# Patient Record
Sex: Female | Born: 1975 | Race: White | Hispanic: No | Marital: Married | Smoking: Former smoker
Health system: Southern US, Community
[De-identification: ages and names within clinical notes are randomized; demographics above are authoritative.]

## PROBLEM LIST (undated history)

## (undated) DIAGNOSIS — R519 Headache, unspecified: Secondary | ICD-10-CM

## (undated) DIAGNOSIS — Z8719 Personal history of other diseases of the digestive system: Secondary | ICD-10-CM

## (undated) DIAGNOSIS — K219 Gastro-esophageal reflux disease without esophagitis: Secondary | ICD-10-CM

## (undated) DIAGNOSIS — M92529 Juvenile osteochondrosis of tibia tubercle, unspecified leg: Secondary | ICD-10-CM

## (undated) DIAGNOSIS — K589 Irritable bowel syndrome without diarrhea: Secondary | ICD-10-CM

## (undated) DIAGNOSIS — Z8489 Family history of other specified conditions: Secondary | ICD-10-CM

## (undated) DIAGNOSIS — Z87442 Personal history of urinary calculi: Secondary | ICD-10-CM

## (undated) DIAGNOSIS — R569 Unspecified convulsions: Secondary | ICD-10-CM

## (undated) DIAGNOSIS — N3010 Interstitial cystitis (chronic) without hematuria: Secondary | ICD-10-CM

## (undated) DIAGNOSIS — R51 Headache: Secondary | ICD-10-CM

## (undated) HISTORY — DX: Gastro-esophageal reflux disease without esophagitis: K21.9

## (undated) HISTORY — PX: LAPAROSCOPY: SHX197

## (undated) HISTORY — DX: Unspecified convulsions: R56.9

## (undated) HISTORY — PX: OTHER SURGICAL HISTORY: SHX169

## (undated) HISTORY — PX: TUBAL LIGATION: SHX77

## (undated) HISTORY — DX: Irritable bowel syndrome, unspecified: K58.9

## (undated) HISTORY — PX: UPPER GASTROINTESTINAL ENDOSCOPY: SHX188

## (undated) HISTORY — PX: APPENDECTOMY: SHX54

## (undated) HISTORY — PX: COLONOSCOPY: SHX174

## (undated) HISTORY — DX: Headache: R51

## (undated) HISTORY — DX: Headache, unspecified: R51.9

## (undated) HISTORY — PX: KNEE SURGERY: SHX244

## (undated) HISTORY — PX: CHOLECYSTECTOMY: SHX55

---

## 2004-04-21 ENCOUNTER — Other Ambulatory Visit: Admission: RE | Admit: 2004-04-21 | Discharge: 2004-04-21 | Payer: Self-pay | Admitting: Obstetrics and Gynecology

## 2004-06-19 ENCOUNTER — Emergency Department (HOSPITAL_COMMUNITY): Admission: EM | Admit: 2004-06-19 | Discharge: 2004-06-20 | Payer: Self-pay | Admitting: *Deleted

## 2004-06-24 ENCOUNTER — Ambulatory Visit (HOSPITAL_COMMUNITY): Admission: RE | Admit: 2004-06-24 | Discharge: 2004-06-24 | Payer: Self-pay | Admitting: Urology

## 2004-10-14 ENCOUNTER — Other Ambulatory Visit: Admission: RE | Admit: 2004-10-14 | Discharge: 2004-10-14 | Payer: Self-pay | Admitting: Obstetrics and Gynecology

## 2004-10-28 ENCOUNTER — Inpatient Hospital Stay (HOSPITAL_COMMUNITY): Admission: AD | Admit: 2004-10-28 | Discharge: 2004-10-30 | Payer: Self-pay | Admitting: Obstetrics and Gynecology

## 2004-11-04 ENCOUNTER — Inpatient Hospital Stay (HOSPITAL_COMMUNITY): Admission: AD | Admit: 2004-11-04 | Discharge: 2004-11-06 | Payer: Self-pay | Admitting: Obstetrics and Gynecology

## 2005-01-22 ENCOUNTER — Emergency Department (HOSPITAL_COMMUNITY): Admission: EM | Admit: 2005-01-22 | Discharge: 2005-01-22 | Payer: Self-pay | Admitting: Emergency Medicine

## 2005-01-24 ENCOUNTER — Emergency Department (HOSPITAL_COMMUNITY): Admission: EM | Admit: 2005-01-24 | Discharge: 2005-01-24 | Payer: Self-pay | Admitting: Emergency Medicine

## 2005-03-13 ENCOUNTER — Inpatient Hospital Stay (HOSPITAL_COMMUNITY): Admission: AD | Admit: 2005-03-13 | Discharge: 2005-03-14 | Payer: Self-pay | Admitting: Obstetrics & Gynecology

## 2005-03-20 ENCOUNTER — Inpatient Hospital Stay (HOSPITAL_COMMUNITY): Admission: AD | Admit: 2005-03-20 | Discharge: 2005-03-29 | Payer: Self-pay | Admitting: Obstetrics and Gynecology

## 2005-04-08 ENCOUNTER — Encounter (INDEPENDENT_AMBULATORY_CARE_PROVIDER_SITE_OTHER): Payer: Self-pay | Admitting: Specialist

## 2005-04-08 ENCOUNTER — Inpatient Hospital Stay (HOSPITAL_COMMUNITY): Admission: AD | Admit: 2005-04-08 | Discharge: 2005-04-11 | Payer: Self-pay | Admitting: Obstetrics and Gynecology

## 2005-05-17 ENCOUNTER — Ambulatory Visit (HOSPITAL_COMMUNITY): Admission: RE | Admit: 2005-05-17 | Discharge: 2005-05-17 | Payer: Self-pay | Admitting: *Deleted

## 2005-05-17 ENCOUNTER — Encounter (INDEPENDENT_AMBULATORY_CARE_PROVIDER_SITE_OTHER): Payer: Self-pay | Admitting: *Deleted

## 2005-05-20 ENCOUNTER — Other Ambulatory Visit: Admission: RE | Admit: 2005-05-20 | Discharge: 2005-05-20 | Payer: Self-pay | Admitting: Obstetrics and Gynecology

## 2005-10-22 ENCOUNTER — Observation Stay (HOSPITAL_COMMUNITY): Admission: AD | Admit: 2005-10-22 | Discharge: 2005-10-24 | Payer: Self-pay | Admitting: Obstetrics and Gynecology

## 2006-01-13 ENCOUNTER — Ambulatory Visit (HOSPITAL_COMMUNITY): Admission: RE | Admit: 2006-01-13 | Discharge: 2006-01-13 | Payer: Self-pay | Admitting: Gastroenterology

## 2006-02-28 HISTORY — PX: DILATION AND CURETTAGE OF UTERUS: SHX78

## 2006-08-08 ENCOUNTER — Ambulatory Visit (HOSPITAL_BASED_OUTPATIENT_CLINIC_OR_DEPARTMENT_OTHER): Admission: RE | Admit: 2006-08-08 | Discharge: 2006-08-08 | Payer: Self-pay | Admitting: Urology

## 2007-01-03 ENCOUNTER — Encounter (INDEPENDENT_AMBULATORY_CARE_PROVIDER_SITE_OTHER): Payer: Self-pay | Admitting: Obstetrics and Gynecology

## 2007-01-03 ENCOUNTER — Ambulatory Visit (HOSPITAL_COMMUNITY): Admission: AD | Admit: 2007-01-03 | Discharge: 2007-01-03 | Payer: Self-pay | Admitting: Obstetrics and Gynecology

## 2007-05-25 ENCOUNTER — Inpatient Hospital Stay (HOSPITAL_COMMUNITY): Admission: AD | Admit: 2007-05-25 | Discharge: 2007-05-25 | Payer: Self-pay | Admitting: Obstetrics and Gynecology

## 2007-05-28 ENCOUNTER — Inpatient Hospital Stay (HOSPITAL_COMMUNITY): Admission: AD | Admit: 2007-05-28 | Discharge: 2007-05-31 | Payer: Self-pay | Admitting: Obstetrics and Gynecology

## 2007-06-04 ENCOUNTER — Observation Stay (HOSPITAL_COMMUNITY): Admission: AD | Admit: 2007-06-04 | Discharge: 2007-06-04 | Payer: Self-pay | Admitting: Obstetrics and Gynecology

## 2007-06-15 ENCOUNTER — Inpatient Hospital Stay (HOSPITAL_COMMUNITY): Admission: AD | Admit: 2007-06-15 | Discharge: 2007-06-15 | Payer: Self-pay | Admitting: Obstetrics and Gynecology

## 2007-08-01 ENCOUNTER — Inpatient Hospital Stay (HOSPITAL_COMMUNITY): Admission: AD | Admit: 2007-08-01 | Discharge: 2007-08-01 | Payer: Self-pay | Admitting: Obstetrics and Gynecology

## 2007-08-30 ENCOUNTER — Inpatient Hospital Stay (HOSPITAL_COMMUNITY): Admission: AD | Admit: 2007-08-30 | Discharge: 2007-08-31 | Payer: Self-pay | Admitting: Obstetrics and Gynecology

## 2007-09-26 ENCOUNTER — Inpatient Hospital Stay (HOSPITAL_COMMUNITY): Admission: AD | Admit: 2007-09-26 | Discharge: 2007-09-26 | Payer: Self-pay | Admitting: Obstetrics and Gynecology

## 2007-10-30 ENCOUNTER — Inpatient Hospital Stay (HOSPITAL_COMMUNITY): Admission: AD | Admit: 2007-10-30 | Discharge: 2007-10-31 | Payer: Self-pay | Admitting: Obstetrics and Gynecology

## 2007-12-06 ENCOUNTER — Inpatient Hospital Stay (HOSPITAL_COMMUNITY): Admission: AD | Admit: 2007-12-06 | Discharge: 2007-12-06 | Payer: Self-pay | Admitting: Obstetrics and Gynecology

## 2007-12-23 ENCOUNTER — Inpatient Hospital Stay (HOSPITAL_COMMUNITY): Admission: AD | Admit: 2007-12-23 | Discharge: 2007-12-23 | Payer: Self-pay | Admitting: Obstetrics and Gynecology

## 2007-12-31 ENCOUNTER — Encounter (INDEPENDENT_AMBULATORY_CARE_PROVIDER_SITE_OTHER): Payer: Self-pay | Admitting: Obstetrics and Gynecology

## 2007-12-31 ENCOUNTER — Inpatient Hospital Stay (HOSPITAL_COMMUNITY): Admission: RE | Admit: 2007-12-31 | Discharge: 2008-01-03 | Payer: Self-pay | Admitting: Obstetrics and Gynecology

## 2010-05-21 ENCOUNTER — Other Ambulatory Visit (HOSPITAL_COMMUNITY): Payer: Self-pay | Admitting: Obstetrics and Gynecology

## 2010-05-21 ENCOUNTER — Ambulatory Visit (HOSPITAL_COMMUNITY)
Admission: RE | Admit: 2010-05-21 | Discharge: 2010-05-21 | Disposition: A | Discharge status: Home / Self Care | Payer: BC Managed Care – PPO | Source: Ambulatory Visit | Attending: Obstetrics and Gynecology | Admitting: Obstetrics and Gynecology

## 2010-05-21 DIAGNOSIS — R1031 Right lower quadrant pain: Secondary | ICD-10-CM | POA: Insufficient documentation

## 2010-05-21 MED ORDER — IOHEXOL 300 MG/ML  SOLN: 100.0000 mL | Freq: Once | INTRAMUSCULAR | Status: AC | PRN

## 2010-05-21 MED ORDER — IOHEXOL 300 MG/ML  SOLN
100.0000 mL | Freq: Once | INTRAMUSCULAR | Status: AC | PRN
  Administered 2010-05-21: 100 mL via INTRAVENOUS

## 2010-06-22 ENCOUNTER — Ambulatory Visit (HOSPITAL_BASED_OUTPATIENT_CLINIC_OR_DEPARTMENT_OTHER)
Admission: RE | Admit: 2010-06-22 | Discharge: 2010-06-22 | Disposition: A | Discharge status: Home / Self Care | Payer: BC Managed Care – PPO | Source: Ambulatory Visit | Attending: Urology | Admitting: Urology

## 2010-06-22 DIAGNOSIS — N3010 Interstitial cystitis (chronic) without hematuria: Secondary | ICD-10-CM | POA: Insufficient documentation

## 2010-06-22 DIAGNOSIS — R3989 Other symptoms and signs involving the genitourinary system: Secondary | ICD-10-CM | POA: Insufficient documentation

## 2010-06-22 DIAGNOSIS — Z01812 Encounter for preprocedural laboratory examination: Secondary | ICD-10-CM | POA: Insufficient documentation

## 2010-06-22 LAB — POCT I-STAT 4, (NA,K, GLUC, HGB,HCT)
Glucose, Bld: 96 mg/dL (ref 70–99)
HCT: 41 % (ref 36.0–46.0)
Hemoglobin: 13.9 g/dL (ref 12.0–15.0)
Potassium: 3.7 mEq/L (ref 3.5–5.1)
Sodium: 143 mEq/L (ref 135–145)

## 2010-06-22 LAB — POCT PREGNANCY, URINE: Preg Test, Ur: NEGATIVE

## 2010-07-12 NOTE — Op Note (Signed)
  [name], [name]             ACCOUNT NO.:  [id number]  MEDICAL RECORD NO.:  [id number]           PATIENT TYPE:  LOCATION:                                 FACILITY:  PHYSICIAN:  Martina Sinner, MD DATE OF BIRTH:  [birth year]  DATE OF PROCEDURE:  06/22/2010 DATE OF DISCHARGE:                              OPERATIVE REPORT   PREOPERATIVE DIAGNOSIS:  Interstitial cystitis.  POSTOPERATIVE DIAGNOSIS:  Interstitial cystitis.  SURGERY:  Cystoscopy, bladder hydrodistention, bladder instillation therapy.  PROCEDURE IN DETAIL:  [name] consented to the above procedure hopefully to help her pain.  She was given ciprofloxacin prior the procedure.  She was prepped and draped in usual fashion.  A 21 scope was utilized.  Bladder mucosa and trigone were normal.  She was hydrodistended to approximately for 4 to 5 minutes to approximately 600 mL.  Her bladder was emptied.  I reinspected her bladder and she had mild glomerulations along the floor of the bladder with no bladder injury.  Bladder was emptied.  As a separate procedure, I inserted a red rubber catheter, instilled 15 cc of 0.5% Marcaine plus 400 mg of Pyridium.  [name] will continue to be treated for interstitial cystitis.          ______________________________ Martina Sinner, MD     SAM/MEDQ  D:  06/22/2010  T:  06/22/2010  Job:  [id number]  Electronically Signed by Alfredo Martinez MD on 07/12/2010 01:16:09 PM

## 2010-07-13 NOTE — Op Note (Signed)
NAMEALMAS, RAKE             ACCOUNT NO.:  [id number]   MEDICAL RECORD NO.:  [id number]          PATIENT TYPE:  MAT   LOCATION:  MATC                          FACILITY:  [facility]   PHYSICIAN:  Michelle L. Grewal, M.D.DATE OF BIRTH:  [birth year]   DATE OF PROCEDURE:  01/03/2007  DATE OF DISCHARGE:  01/03/2007                               OPERATIVE REPORT   PREOPERATIVE DIAGNOSIS:  Incomplete abortion.   POSTOPERATIVE DIAGNOSIS:  Incomplete abortion.   PROCEDURES PERFORMED:  Dilatation and evacuation.   SURGEON:  Michelle L. Vincente Poli, M.D.   ANESTHESIA:  MAC with local.   FINDINGS:  Products of conception.   PROCEDURE:  Patient was taken to the operating room after informed  consent was obtained.  She was then prepped and draped in the usual  sterile fashion.  In-and-out catheter was used to empty the bladder.  The speculum was inserted into the vagina; the cervix was visualized.  There were noted to be some products of conception, a small amount,  extruding from the cervical os.  The cervix was grasped with a  tenaculum.  A paracervical block was performed.  I then dilated the  cervix a little bit more with Shawnie Pons dilators.  I inserted a #7 suction  cannula and performed a suction curettage with retrieval of contents  consistent with products of conception.  This curette was removed and  the sharp curette was inserted and the uterus was thoroughly curetted of  all tissue.  Final suction curettage was performed.  The tissue appeared  to be consistent with products of conception.  All tissue was sent to  Pathology for analysis.  There was no bleeding noted at the end of the  procedure.  The patient tolerated the procedure very well.  All sponge,  lap and instrument counts were correct x2.      Michelle L. Vincente Poli, M.D.  Electronically Signed     MLG/MEDQ  D:  01/03/2007  T:  01/04/2007  Job:  [id number]

## 2010-07-13 NOTE — Op Note (Signed)
Traci [name], Traci [name]             ACCOUNT NO.:  [id number]   MEDICAL RECORD NO.:  [id number]          PATIENT TYPE:  INP   LOCATION:  9130                          FACILITY:  [facility]   PHYSICIAN:  Michelle L. Grewal, M.D.DATE OF BIRTH:  Nov 24, 1975   DATE OF PROCEDURE:  12/31/2007  DATE OF DISCHARGE:                               OPERATIVE REPORT   PREOPERATIVE DIAGNOSES:  1. Intrauterine pregnancy at 39 plus weeks.  2. Previous cesarean section.  3. Desires permanent sterilization.   POSTOPERATIVE DIAGNOSES:  1. Intrauterine pregnancy at 39 plus weeks.  2. Previous cesarean section.  3. Desires permanent sterilization.   PROCEDURES:  Repeat low transverse cesarean section and bilateral tubal  ligation.   SURGEON:  Michelle L. Vincente Poli, MD.   ANESTHESIA:  Spinal.   FINDINGS:  Female infant, Apgars 8 at 1 minute and 9 at 5 minutes.   PATHOLOGY:  Fallopian tube segments.   ESTIMATED BLOOD LOSS:  500 mL.   DRAINS:  Foley.   PROCEDURE:  The patient was taken to the operating room.  Her spinal was  placed by the anesthesiologist without incident.  She was then prepped  and draped in usual sterile fashion.  Foley catheter was inserted and  draining clear urine.  A low transverse incision was made after the  patient was draped and carried down to the fascia.  The fascia was  scored in the midline and extended laterally.  The rectus muscles  separated in the midline.  The peritoneum was entered bluntly.  The  peritoneal incision was then stretched.  The lower uterine segment was  developed, and the bladder flap was created sharply and then digitally.  The bladder blade was then readjusted.  A low transverse incision was  made in the uterus.  The uterus was entered using a hemostat and  amniotic fluid was clear.  The baby was in cephalic presentation and was  delivered easily with 1 pull of the vacuum without a pop-off.  The baby  was a female infant, Apgars were 8 at 1 minute and 9  at 5 minutes.  The  cord was clamped and cut.  The baby was handed to the awaiting neonatal  team and taken to the nursery.  The placenta was manually removed, noted  be normal, and intact with a 3-vessel cord.  The uterus was exteriorized  and cleared of all clots and debris.  The uterine incision was closed in  1 layer using 0 chromic in a running locked stitch.  There was some  evidence of some small endometriotic implants along the right round  ligament.  They were then cauterized.  At this point, I performed a  modified Pomeroy bilateral tubal ligation because the patient desired  permanent sterilization.  The midportion of each tube was grasped using  Babcock clamp and a plain gut suture x2 was used to tie off the 3-cm  knuckle of tissue on each side.  The fallopian tube segment was then  excised on each side using Metzenbaum scissors and then the bases were  cauterized using cautery.  Each tubal segment was sent  separately to  Pathology.  The uterus was returned to the abdomen.  Irrigation was  performed.  Hemostasis was noted at all surgical pedicles.  The  peritoneum rectus muscles were reapproximated using 0 Vicryl.  The  fascia was closed using 0 Vicryl in a running stitch, starting at 1  corner and going to the other end.  After irrigation of subcutaneous  layer, the skin was closed with staples.  All sponge, lap and,  instrument counts were correct x2.  The patient went to recovery room in  stable condition.      Michelle L. Vincente Poli, M.D.  Electronically Signed     MLG/MEDQ  D:  12/31/2007  T:  01/01/2008  Job:  [id number]

## 2010-07-13 NOTE — Op Note (Signed)
[name], Traci [name]             ACCOUNT NO.:  [id number]   MEDICAL RECORD NO.:  [id number]          PATIENT TYPE:  AMB   LOCATION:  NESC                         FACILITY:  [facility]   PHYSICIAN:  Martina Sinner, MD DATE OF BIRTH:  December 18, 1975   DATE OF PROCEDURE:  08/08/2006  DATE OF DISCHARGE:                               OPERATIVE REPORT   PREOPERATIVE DIAGNOSIS:  Pelvic pain.   POSTOPERATIVE DIAGNOSES:  Pelvic pain, interstitial cystitis.   SURGERY:  Cystoscopy, hydrodistention of bladder, installation therapy.   SURGEON:  Martina Sinner, M.D.   INDICATIONS FOR PROCEDURE:  [name] has pelvic pain, in  keeping with the diagnosis of interstitial cystitis.  Today she was here  for hydrodistention.   DESCRIPTION OF PROCEDURE:  She was given preoperative antibiotics.  The  patient was prepped and draped in the usual fashion.  I used the 66-  [place] scope for the examination.  The bladder mucosa and trigone were  normal.  There was foreign body or carcinoma.  I hydrochlorothiazide-  distended her to 900 mL.  I then re-examined the bladder after I emptied  it and she had diffuse glomerulations throughout the bladder.  There are  more glomerulations at 5 and 7 o'clock.  The bladder was emptied.  A red  rubber catheter was inserted and I instilled 25 mL of 0.5% Marcaine with  400 mg of Pyridium.   The procedure was well-tolerated and the patient was taken to the  recovery room.   DISPOSITION:  The patient will be treated for interstitial cystitis with  my usual protocol.           ______________________________  Martina Sinner, MD  Electronically Signed     SAM/MEDQ  D:  08/08/2006  T:  08/08/2006  Job:  [id number]

## 2010-07-16 NOTE — Discharge Summary (Signed)
NAMEMarland Kitchen  Traci [name], Traci [name]             ACCOUNT NO.:  [id number]   MEDICAL RECORD NO.:  [id number]          PATIENT TYPE:  INP   LOCATION:  9320                          FACILITY:  [facility]   PHYSICIAN:  Freddy Finner, M.D.   DATE OF BIRTH:  Jan 20, 1976   DATE OF ADMISSION:  03/20/2005  DATE OF DISCHARGE:  03/29/2005                                 DISCHARGE SUMMARY   ADMISSION DIAGNOSES:  1.  Intrauterine pregnancy at 33-3/7 weeks estimated gestational age.  2.  Nausea vomiting.   DISCHARGE DIAGNOSIS:  Intrauterine pregnancy at 34-6/7 weeks and  chololithiasis.   REASON FOR ADMISSION:  Please see written H&P.   HOSPITAL COURSE:  The patient is a 35 year old primigravida white married  female that was admitted to [facility] at 33-3/7 weeks  estimated gestational age.  The patient was complaining of nausea and  vomiting for approximately 3 days.  She denied any fever or chills or  diarrhea.  Pregnancy had been complicated by a seizure disorder with the  patient currently on Topamax with last seizure approximately 3 years ago.  The patient also had been noted to have first trimester hyperemesis and was  currently being treated for GERD. On admission vital signs were stable.  She  was afebrile.  Abdomen soft and nontender.  Fetal heart tones were reactive.  Uterus was noted to have some irritability. Laboratory findings revealed  hemoglobin of 10.9, platelet count of 295,000, WBC count of 7.7.  Urinalysis  revealed specific gravity of 1.015 and nitrates were negative.  The patient  was admitted for IV fluid administration and IV Pepcid and Zofran as needed.  On the following morning the patient continued to feel weak.  She was  experiencing some dry heaves. She had not experienced any diarrhea. Vital  signs remained stable.  She continued to be afebrile. Uterus was nontender.  On hospital day #3 the patient continued to experience some nausea,  occasional vomiting.  The  patient had experienced some diarrhea after  breakfast. She also complained of some pain in the right upper quadrant and  reported some low uterine contractions.  Vital signs remained stable.  She  was afebrile. Abdomen soft. She denied any leakage of amniotic fluid or  vaginal bleeding. No CVA tenderness was noted.  Urine culture was pending.  Fetal heart tones were in the 150's. The patient was placed on the monitor.  Contractions were not noted.  Fetal heart tones continued to be reactive.  Lipase and amylase were drawn and liver function tests. Gallbladder  ultrasound was performed which revealed multiple gallstones, however no  thickening of the wall of the gallbladder and negative Murphy's sign.  Cholecystic fluid minimal.  Minimal sludge was also noted within the  gallbladder. Common bile duct was also within normal limits. Surgical  consult was obtained and the patient was placed on a low fat diet. No  evidence of acute cholecystitis was noted and decision was made to observe  the patient at this current time and treat symptomatically and planned for  postpartum lap cholecystectomy.  The patient continued to have some  nausea,  vomiting and some diarrhea throughout the course of her stay. Decision was  made to proceed with amniocentesis for possible lung maturity. LS ratio  returned with ratio of 1.1:1 without PG.  Fetal heart tones continued to be  reactive.  Mild occasional uterine contraction was noted.  Betamethasone was  administered on January 30th and the patient was now at 34-6/7 weeks. She  continued to have some nausea.  However, no vomitus. Fetus was active. Vital  signs were stable.  The patient remained afebrile. Completion of  betamethasone administration was given and the patient was discharged home.   CONDITION ON DISCHARGE:  Stable.  Low fat diet.   ACTIVITY:  Up as tolerated.  The patient is to call for increasing uterine  contractions, increase in vaginal  discharge, or vaginal bleeding.  The  patient is also to call for spontaneous rupture of membranes with decreased  fetal movement.   DISCHARGE MEDICATIONS:  1.  Vicodin #30 one p.o. every 4 to 6 hours p.r.n.  2.  Prenatal vitamins one p.o. daily.  3.  Ambien 10 mg one p.o. q.h.s. p.r.n.  4.  Phenergan 25 mg one p.o. every 8 hours p.r.n. nausea.  5.  Lomotil as needed.      Julio Sicks, N.P.      Freddy Finner, M.D.  Electronically Signed    CC/MEDQ  D:  05/29/2005  T:  05/30/2005  Job:  [id number]

## 2010-07-16 NOTE — Consult Note (Signed)
NAME:  Traci [name], Traci [name]             ACCOUNT NO.:  [id number]   MEDICAL RECORD NO.:  [id number]          PATIENT TYPE:  INP   LOCATION:  9320                          FACILITY:  [facility]   PHYSICIAN:  Alfonse Ras, MD   DATE OF BIRTH:  07-17-1975   DATE OF CONSULTATION:  03/23/2005  DATE OF DISCHARGE:                                   CONSULTATION   REFERRING PHYSICIAN:  Dr. Konrad Dolores   HISTORY OF PRESENT ILLNESS:  Patient is a 35 year old G1, P0 34-week  pregnant female with a history of nausea and vomiting and right upper  quadrant abdominal pain.  Work-up here in the hospital reveals  cholelithiasis with multiple mobile gallstones, no gallbladder wall  thickening, no common bile duct dilatation, no pericholecystic fluid, no  Murphy's sign.  Liver function tests are normal as is her lipase and white  count is 7000.  Patient was able to keep down lunch today, but had some  nausea and vomiting with breakfast this morning.   PAST MEDICAL HISTORY:  1.  Gastroesophageal reflux disease.  2.  Migraines.   MEDICATIONS:  Topamax and Protonix.   PHYSICAL EXAMINATION:  GENERAL:  She is an age-appropriate gravid female in  no distress.  HEENT:  Benign.  Sclerae non-icteric.  LUNGS:  Clear to auscultation, percussion x2.  HEART:  Regular rate and rhythm without murmurs, rubs, or gallops.  ABDOMEN:  Gravid uterus and some tenderness in the epigastrium and right  upper quadrant.  No peritoneal signs are noted.   IMPRESSION:  Symptomatic cholelithiasis.   PLAN:  Postpartum laparoscopic cholecystectomy; however, if the patient's  condition worsens or she begins to show signs of acute cholecystitis we may  be able to temporize her with a percutaneous cholecystostomy but  laparoscopic cholecystectomy is not indicated at this time.      Alfonse Ras, MD  Electronically Signed     KRE/MEDQ  D:  03/23/2005  T:  03/23/2005  Job:  [id number]

## 2010-07-16 NOTE — Op Note (Signed)
NAME:  Traci [name], Traci [name]             ACCOUNT NO.:  [id number]   MEDICAL RECORD NO.:  [id number]          PATIENT TYPE:  AMB   LOCATION:  DAY                          FACILITY:  [facility]   PHYSICIAN:  Alfonse Ras, MD   DATE OF BIRTH:  29-Mar-1975   DATE OF PROCEDURE:  05/17/2005  DATE OF DISCHARGE:                                 OPERATIVE REPORT   PREOPERATIVE DIAGNOSIS:  Symptomatic cholelithiasis.   POSTOPERATIVE DIAGNOSIS:  Symptomatic cholelithiasis.   PROCEDURES:  Laparoscopic cholecystectomy.   SURGEON:  Alfonse Ras, MD.   ASSISTANTAngelia Mould. Derrell Lolling, MD.   ANESTHESIA:  General.   DESCRIPTION OF PROCEDURE:  The patient was taken to the operating room,  placed in the supine position and after adequate anesthesia was induced  using endotracheal tube, the abdomen was prepped and draped in the normal  sterile fashion. Using a transverse infraumbilical incision, I dissected  down to the fascia. The fascia was opened vertically. An #0 Vicryl  pursestring suture was placed around the fascial defect. A Hassan trocar was  placed in the abdomen and pneumoperitoneum was obtained. Under direct  vision, a 10 mm trocar was placed in the subxiphoid region and two 5 mm  trocars placed in the right abdomen. The gallbladder was identified and  retracted cephalad. Starting at the fundus, a good critical view was  visualized at the cystic duct, its junction with the common duct was  identified and it was triply clipped and divided. The cystic artery was  identified, dissected in a similar fashion, triply clipped and divided. The  gallbladder was taken off the gallbladder bed using Bovie electrocautery and  a small rent was made in the posterior wall. It was placed in an EndoCatch  bag and removed through the umbilical port without difficulty. Adequate  hemostasis was ensured and the right upper quadrant was copiously irrigated  with normal saline. The gallbladder bed was inspected.  There was no  bleeding. Pneumoperitoneum was released. Trocars were removed,  infraumbilical fascial defect was closed with the #0 Vicryl pursestring  suture. Skin incisions were closed with subcuticular 4-0 Monocryl. Steri-  Strips and sterile dressings were applied. The patient tolerated the  procedure well and went to PACU in good condition.      Alfonse Ras, MD  Electronically Signed     KRE/MEDQ  D:  05/17/2005  T:  05/18/2005  Job:  [id number]

## 2010-07-16 NOTE — Op Note (Signed)
Traci [name], Traci [name]             ACCOUNT NO.:  [id number]   MEDICAL RECORD NO.:  [id number]          PATIENT TYPE:  INP   LOCATION:  9371                          [facility]ACILITY:  [facility]   PHYSICIAN:  Michelle L. Grewal, M.D.DATE OF BIRTH:  [birth year]   DATE OF PROCEDURE:  04/08/2005  DATE OF DISCHARGE:                                 OPERATIVE REPORT   PREOPERATIVE DIAGNOSES:  1.  Intrauterine pregnancy at 36-1/7 weeks.  2.  Multiple gallstones.  3.  Biliary colic.  4.  Seizure disorder, generalized tonic/clonic while in labor and delivery.   POSTOPERATIVE DIAGNOSES:  1.  Intrauterine pregnancy at 36-1/7 weeks.  2.  Multiple gallstones.  3.  Biliary colic.  4.  Seizure disorder, generalized tonic/clonic while in labor and delivery.   PROCEDURE:  Primary low transverse cesarean section.   SURGEON:  Michelle L. Vincente Poli, M.D.   ANESTHESIA:  Spinal.   ESTIMATED BLOOD LOSS:  500 mL.   FINDINGS:  Female infant, Apgars 8 at one minute 8 at five minutes with a cord  pH of 7.26.  Loose nuchal cord x1.   PROCEDURE:  The patient was taken to the operating room where spinal was  placed by Dr. Leilani Able without incident.  She was prepped and  draped in the usual sterile fashion.  Of note, prior to the spinal the  patient was alert to place and name but not to day of week.  She was then  prepped and draped, and a Foley catheter was inserted and draining clear  urine.  A low transverse incision was made, carried down to the fascia.  The  fascia was scored in the midline and extended laterally.  The rectus muscles  were separated in the midline and extended laterally.  The peritoneum was  entered bluntly, and the peritoneal incision was stretched.  A bladder blade  was inserted.  The lower uterine segment was identified and the bladder flap  was created sharply and then digitally.  The bladder blade was readjusted.  A low transverse incision was made in the uterus.  The uterus was  entered  using a hemostat.  The baby was delivered quite easily, and he had a loose  nuchal cord x1 with Apgars of 8 at one minute and 8 at minutes and cord pH  at 7.26.  The cord was clamped and cut.  The baby was handed to the waiting  pediatrician.  Unasyn was given as well as Pitocin.  The uterus was cleared  of all clots and debris and uterine incision was closed using 0 chromic in a  continuous running locked stitch.  Irrigation was performed.  Hemostasis was  excellent.  The peritoneum was reapproximated using O Vicryl, and the rectus  muscles were reapproximated as  well.  The fascia was closed using O Vicryl in a continuous running stitch.  After irrigation of the subcutaneous layers, the  skin was closed with  staples.  All sponge, lap and instrument counts were correct x2.  The  patient tolerated the procedure well.      Michelle L. Vincente Poli, M.D.  Electronically  Signed     MLG/MEDQ  D:  04/08/2005  T:  04/09/2005  Job:  [id number]

## 2010-07-16 NOTE — Discharge Summary (Signed)
NAMEETOSHA, WETHERELL             ACCOUNT NO.:  [id number]   MEDICAL RECORD NO.:  [id number]          PATIENT TYPE:  INP   LOCATION:  9123                          FACILITY:  [facility]   PHYSICIAN:  Zelphia Cairo, MD    DATE OF BIRTH:  Mar 01, 1975   DATE OF ADMISSION:  04/08/2005  DATE OF DISCHARGE:  04/11/2005                                 DISCHARGE SUMMARY   ADMISSION DIAGNOSES:  1.  Intrauterine pregnancy at 25 weeks' estimated gestational age.  2.  Induction of labor secondary to biliary colic.   DISCHARGE DIAGNOSES:  S.  Status post low transverse cesarean section  secondary to seizure, generalized tonic-clonic.  1.  A viable female infant.   PROCEDURE:  Primary low transverse cesarean section.   REASON FOR ADMISSION:  Please see written H&P.   HOSPITAL COURSE:  The patient is a 35 year old primigravida who was admitted  to [facility] at 37 weeks' estimated gestational age for  induction of labor secondary to biliary colic.  The patient had been  previously admitted for biliary colic and was noted to have multiple  gallstones as noted by ultrasound.  The patient had been seen by a general  surgeon, Dr. Luan Pulling, and had been recommended to have a laparoscopic  cholecystectomy postpartally.  The patient had continued to have continuous  pain in the right upper quadrant with some nausea and diarrhea.  The patient  had noted to have lost two pounds since her previous hospitalization.  The  patient was also status post betamethasone, which had been administered a  week and half prior to admission.  The patient also had a history of  seizures, currently on Topamax.   On admission, vital signs were stable.  She was afebrile.  Fetal heart tones  were within normal limits.  Abdomen was noted to have positive Murphy's  sign.  Cervix was noted to be closed, 80% effaced, vertex at a -2 station.  The patient was admitted, where Cytotec was administered.  Later that  evening, Dr. Vincente Poli was called urgently to labor and delivery because the  patient was noted to have a two-minute generalized tonic-clonic seizure.  Anesthesiologist and Dr. Shawnie Pons had also been called urgently, and the  patient was administered oxygen and had been placed on her side.  Immediately after the seizure, the patient was alert to place and name but  not to the date.  Fetal heart tones had dropped during and after the seizure  but, however, had gone back to baseline.  A decision was made to proceed  with an urgent cesarean delivery.  The patient was now stable postictally;  however, she was remote full from a vaginal delivery.  The patient was then  taken to the operating room, where spinal anesthesia was administered  without difficulty.  A low transverse incision was made with delivery of a  viable female infant weighing 4 pounds 12 ounces, with Apgars of 8 at one  minute and 9 at five minutes.  Arterial cord pH was 7.26.  Dr. Orlin Hilding,  neurologist on call, was also notified and the patient was  given Dilantin  1  g load IV.  The patient did tolerated the procedure well and was taken to  the recovery room in stable condition.  The patient was now evaluated by Dr.  Orlin Hilding, neurologist.  On postoperative day #1 the patient was feeling well,  no further seizures, vital signs were stable.  She was afebrile with urine  output that was noted to be good.  Abdomen soft, nontender with exception of  positive Murphy sign.  Uterus was nontender.  Abdominal dressing was noted  to be clean, dry and intact.  Laboratory findings revealed hemoglobin of  10.8 with platelet count of 274,000 , WBC count of 13.4.  Chemistries were  within normal limits wit the exception of potassium that was noted to be low  at 2.9.  Liver function tests were within normal limits.  Potassium was  added to IV fluids.  On postoperative day #2 the patient was without  complaint.  Vital signs remained stable.  She was  afebrile.  Fundus firm and  nontender.  Incision was clean, dry and intact.  The patient did not have  any further seizures and was now started on Topamax 125 mg b.i.d.  Postoperative day #3 the patient was without complaint.  Vital signs were  stable.  She was afebrile.  Blood pressure 111/79.  Abdomen soft.  Fundus  firm and nontender.  Incision was clean, dry and intact.  She was ambulating  well, discharge instructions were given and the patient was discharged home.   CONDITION ON DISCHARGE:  Stable.   DIET:  Regular as tolerated.   ACTIVITY:  No heavy lifting, no driving x2 weeks, no vaginal entry.   FOLLOW-UP:  Patient to follow up in the office in one week for an incision  check.  She is to call for temperature greater than 100 degrees, persistent  nausea or vomiting or heavy vaginal bleeding and/or redness or drainage from  the incisional site.   DISCHARGE MEDICATIONS:  1.  Topamax 125 mg one p.o. b.i.d.  2.  Percocet 5/325 mg #30, one p.o. q.4-6h. p.r.n.  3.  Motrin 600 mg every six hours.  4.  Ferrous sulfate 325 mg one p.o. b.i.d.  5.  Colace one p.o. q.d. as needed.      Julio Sicks, N.P.      Zelphia Cairo, MD  Electronically Signed    CC/MEDQ  D:  05/29/2005  T:  05/30/2005  Job:  [id number]

## 2010-07-16 NOTE — Discharge Summary (Signed)
Traci [name], Traci [name]             ACCOUNT NO.:  [id number]   MEDICAL RECORD NO.:  [id number]          PATIENT TYPE:  INP   LOCATION:  9130                          FACILITY:  [facility]   PHYSICIAN:  Dineen Kid. Rana Snare, M.D.    DATE OF BIRTH:  Sep 07, 1975   DATE OF ADMISSION:  12/31/2007  DATE OF DISCHARGE:  01/03/2008                               DISCHARGE SUMMARY   ADMITTING DIAGNOSES:  1. Intrauterine pregnancy at term.  2. Previous cesarean section, desires repeat.  3. Multiparity, desires permanent sterilization.   DISCHARGE DIAGNOSES:  1. Status post low transverse cesarean section.  2. Viable female infant.  3. Permanent sterilization.   PROCEDURES:  1. Repeat low transverse cesarean section.  2. Bilateral tubal ligation.   REASON FOR ADMISSION:  Please see written H and P.   HOSPITAL COURSE:  The patient was a 36 year old gravida 3, para 1 who  presented to [facility] for scheduled cesarean section and  bilateral tubal ligation.  The patient had started uterine contractions  on the morning of admission she was scheduled for C-section  approximately at lunchtime.  The patient did have a known seizure  disorder and was currently on medication.  The patient was admitted to  the Labor and Delivery and was subsequently taken to the operating room  where spinal anesthesia was administered without difficulty.  A low  transverse incision was made with delivery of a viable female infant  weighing 6 pounds 11 ounces with Apgars of 9 at one minute and 9 at five  minutes.  The patient tolerated the procedure well and was taken to the  recovery room in stable condition.  On postoperative day #1, the patient  was without complaint.  Vital signs were stable.  Abdomen soft.  Fundus  firm and nontender.  Abdominal dressing was noted to have a small amount  of drainage noted on the bandage.  Laboratory findings showed hemoglobin  of 9.6, platelet count of 181,000, WBC count of 7.8.  Blood  type was  noted to be A positive.  On postoperative day #2, the patient was  without complaint.  Vital signs were stable.  She was afebrile.  Fundus  firm and nontender.  Incision was clean, dry, and intact.  Staples were  intact.  The patient was ambulating well.  Later that afternoon, the  patient had ambulated to the bathroom, was noted that incision was  bleeding.  Pressure bandage was applied.  Later that evening, the  physician did visit with the patient and some saturation of the bandage  was noted.  No active bright red blood was noted.  The bandage was  removed.  No induration or active bleeding was noted.  Pressure bandage  was reapplied and decision was made to leave the staples in place for  the next 7-10 days.  On postoperative day #3, the patient was concerned  regarding the incision continued to have some serosanguineous drainage.  Vital signs were stable.  She was afebrile.  Abdomen soft.  Fundus firm,  nontender.  Some ecchymosis was noted superior and inferior to the  incisional  site.  The dressing seem to be mostly saturated with  serosanguineous drainage, which was removed and incision was noted to  have no leakage even with pressure applied to it.  Staples were left in  place, and the patient was later discharged home.   CONDITION ON DISCHARGE:  Stable.   DIET:  Regular as tolerated.   ACTIVITY:  No heavy lifting, no driving x2 weeks, no vaginal entry.   FOLLOW UP:  The patient to follow up in the office in 3-4 days for an  incision check.  She is to call for temperature greater than 100  degrees, persistent nausea, vomiting, heavy vaginal bleeding and/or  redness or further drainage oozing from the incisional site.   DISCHARGE MEDICATIONS:  1. Percocet 5/325 #30 one p.o. every 4-6 hours p.r.n.  2. Motrin 600 mg every 6 hours.  3. Keflex 500 mg one p.o. q.i.d. x1 week.  4. Prenatal vitamins one p.o. daily.  5. Colace one p.o. daily p.r.n.      Julio Sicks, N.P.      Dineen Kid Rana Snare, M.D.  Electronically Signed    CC/MEDQ  D:  02/05/2008  T:  02/05/2008  Job:  [id number]

## 2010-07-16 NOTE — Consult Note (Signed)
Traci [name], Traci [name]             ACCOUNT NO.:  [id number]   MEDICAL RECORD NO.:  [id number]          PATIENT TYPE:  INP   LOCATION:  9371                          FACILITY:  [facility]   PHYSICIAN:  Catherine A. Orlin Hilding, M.D.DATE OF BIRTH:  June 04, 1975   DATE OF CONSULTATION:  04/08/2005  DATE OF DISCHARGE:                                   CONSULTATION   NEUROLOGY CONSULT   CHIEF COMPLAINT:  Seizure.   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  She had previously been on Depakote and  Lamictal, and she is currently on Topamax for about 4 years with good  seizure control in general with no seizures for 3 years although there is a  history of her being somewhat fragile with having seizures when missing  doses.  She has a recent history of abdominal pain and was found to have  gallstones during this pregnancy.  She is admitted for induction of labor at  36 weeks to allow for a laparoscopic cholecystectomy to follow.  While in  labor, she had a 2 minute generalized seizure and she recovered  uneventfully, but was taken to the OR for a C-section with spinal anesthesia  because it was felt that vaginal delivery was not imminent and there was a  fear of further seizure.   REVIEW OF SYSTEMS:  She is somewhat drowsy now and otherwise is positive for  abdominal pain. In a 12-system review from the admission,  there is a  history of some reflux and diarrhea, and the right upper quadrant pain, and  some sleeping difficulties.  She has taken p.r.n. Ambien.  Generally, no  other complaints.   PAST MEDICAL HISTORY:  Significant for epilepsy followed by a Dr. Arville Go at [facility].  She is G1 and now P1, she has had knee surgery,  obesity and gallstones.   MEDICATIONS:  1.  Topamax 100 mg b.i.d.  2.  Protonix, I do not know the dose on that.  3.   Ambien p.r.n.   ALLERGIES:  NO KNOWN DRUG ALLERGIES.   SOCIAL HISTORY:  She is married, no other children and no cigarette or  alcohol use.   FAMILY HISTORY:  No history of seizures.   EXAMINATION:  VITAL SIGNS:  Temperature is 98.8, pulse 96, BP 125/65,  respirations 20 and 99% sat.  HEAD:  Normocephalic, atraumatic.  NECK:  Supple without bruits.  HEART:  Regular rate and rhythm.  MOUTH:  Without any tongue laceration.  NEUROLOGIC EXAMINATION:  Mental Status:  She is somewhat sedated, but  arouses easily with normal language, naming and repetition.  She is oriented  to date and place.  Cranial nerves:  Pupils are equal and reactive,  extraocular movements are intact, visual fields are full, facial sensation  is normal, facial motor activity is intact, hearing is intact and tongue is  midline.  Motor exam:  She has no drift in the upper extremities and has  normal strength.  The lower extremities  are paralyzed secondary to spinal  anesthesia.  Deep tendon reflexes are 2+ and symmetric in the upper  extremities, absent in the lower extremities.  Coordination:  Finger-to-nose  is intact.  She is unable to do heel-to-shin because of the spinal  anesthesia.  Sensory is intact in the upper extremities, again anesthetic in  the lower extremities due to the spinal anesthesia.   LABORATORY:  Unremarkable.   IMPRESSION:  Breakthrough seizure in labor in a patient with a history of  epilepsy presumably juvenile myoclonic epilepsy x17 years with somewhat  brittle control, nothing to point to eclampsia or other cause.   RECOMMENDATIONS:  Bolus with Dilantin 1 g x1 for this perioperative period  with no maintenance, we will allow to drift down spontaneously.  We will  increase her Topamax to 125 mg b.i.d.  She will need to follow up with her  primary neurologist, Dr. August Saucer and plan ahead for the cholecystectomy if she  is to be n.p.o. at all for that period as the Topamax is not available  IV.      Catherine A. Orlin Hilding, M.D.  Electronically Signed     CAW/MEDQ  D:  04/08/2005  T:  04/09/2005  Job:  [id number]

## 2010-07-16 NOTE — Op Note (Signed)
NAME:  Traci [name], Traci [name]             ACCOUNT NO.:  [id number]   MEDICAL RECORD NO.:  [id number]          PATIENT TYPE:  AMB   LOCATION:  ENDO                         FACILITY:  [facility]   PHYSICIAN:  Anselmo Rod, M.D.  DATE OF BIRTH:  January 14, 1976   DATE OF PROCEDURE:  01/13/2006  DATE OF DISCHARGE:                                 OPERATIVE REPORT   PROCEDURE:  Screening colonoscopy.   ENDOSCOPIST:  Anselmo Rod, M.D.   INSTRUMENT USED:  Olympus video colonoscope.   INDICATIONS FOR PROCEDURE:  A 35 year old white female with a history of  change in bowel habits, diarrhea with abdominal pain and occasional blood in  stool.  Rule out IBD.  The patient had a laparoscopic evaluation done that  showed some inflammatory changes around the cecum. Therefore, the patient's  gynecologist requested colonoscopy.   PREPROCEDURE PREPARATION:  Informed consent was procured from the patient.  The patient was fasted for four hours prior to the procedure and prepped  with 20 OsmoPrep pills the night of the procedure and 12 OsmoPrep pills the  morning of the procedure.  Risks and benefits of the procedure including a  10% misread of cancer and polyps were discussed with the patient as well.   PREPROCEDURE PHYSICAL:  VITAL SIGNS:  The patient had stable vital signs.  NECK:  Supple.  CHEST:  Clear to auscultation.  CARDIAC:  S1 and S2 regular.  ABDOMEN:  Soft with normal bowel sounds.   DESCRIPTION OF PROCEDURE:  The patient was placed in the left lateral  decubitus position and sedated with 75 mcg of fentanyl and 8 mg of Versed  given intravenously in slow incremental doses.  Once the patient was  adequately sedated, maintained on low-flow oxygen and continuous cardiac  monitoring, the Olympus video colonoscope was advanced from the rectum to  the cecum.  The appendiceal orifice and cecal valve were clearly visualized  and photographed.  The terminal ileum appeared healthy and without lesions.  No masses, polyps, erosions, ulcerations, or diverticulosis were noted.  Retroflexion in the rectum revealed no abnormalities.  The patient tolerated  the procedure well without complications.  The patient had some abdominal  discomfort with the insufflation of air into the colon indicating a  component of visceral hypersensitivity with irritable bowel syndrome.   IMPRESSION:  1. Normal colonoscopy to the terminal ileum.  No masses, polyps or      diverticula seen. No evidence of inflammatory bowel disease.  2.      Visceral hypersensitivity appreciated with insufflation of air into the      colon consistent with irritable bowel syndrome.   RECOMMENDATIONS:  1. Continue high fiber diet with liberal fluid intake.  2. Repeat colonoscopy at age 26 unless the patient any abnormal symptoms      prior to that, in which case she should contact the office immediately      for further recommendations.  3. Outpatient followup in the next two weeks for further recommendations.      Anselmo Rod, M.D.  Electronically Signed     JNM/MEDQ  D:  01/13/2006  T:  01/13/2006  Job:  [id number]   cc:   Marcelino Duster L. Vincente Poli, M.D.

## 2010-11-22 LAB — URINALYSIS, ROUTINE W REFLEX MICROSCOPIC
Bilirubin Urine: NEGATIVE
Glucose, UA: NEGATIVE
Hgb urine dipstick: NEGATIVE
Ketones, ur: NEGATIVE
Nitrite: NEGATIVE
Protein, ur: NEGATIVE
Specific Gravity, Urine: 1.015
Urobilinogen, UA: 0.2
pH: 8

## 2010-11-22 LAB — URINALYSIS, DIPSTICK ONLY
Bilirubin Urine: NEGATIVE
Glucose, UA: NEGATIVE
Hgb urine dipstick: NEGATIVE
Ketones, ur: NEGATIVE
Leukocytes, UA: NEGATIVE
Nitrite: NEGATIVE
Protein, ur: NEGATIVE
Specific Gravity, Urine: <1.005 — ABNORMAL LOW
Urobilinogen, UA: 0.2
pH: 5.5

## 2010-11-22 LAB — COMPREHENSIVE METABOLIC PANEL
ALT: 18
AST: 15
Albumin: 3.3 — ABNORMAL LOW
Alkaline Phosphatase: 44
BUN: 7
CO2: 22
Calcium: 8.9
Chloride: 113 — ABNORMAL HIGH
Creatinine, Ser: 0.68
GFR calc Af Amer: >60
GFR calc non Af Amer: >60
Glucose, Bld: 86
Potassium: 3.9
Sodium: 141
Total Bilirubin: 0.5
Total Protein: 6

## 2010-11-22 LAB — CBC
HCT: 37.3
Hemoglobin: 13.2
MCHC: 35.4
MCV: 90.1
Platelets: 240
RBC: 4.14
RDW: 12.2
WBC: 6.8

## 2010-11-22 LAB — TSH: TSH: 1.09

## 2010-11-23 LAB — URINALYSIS, ROUTINE W REFLEX MICROSCOPIC
Hgb urine dipstick: NEGATIVE
Nitrite: NEGATIVE
Protein, ur: NEGATIVE
Urobilinogen, UA: 0.2

## 2010-11-25 LAB — URINALYSIS, ROUTINE W REFLEX MICROSCOPIC
Glucose, UA: NEGATIVE
Hgb urine dipstick: NEGATIVE
Ketones, ur: >80 — AB
Protein, ur: NEGATIVE
Urobilinogen, UA: 0.2

## 2010-11-26 LAB — URINALYSIS, ROUTINE W REFLEX MICROSCOPIC
Bilirubin Urine: NEGATIVE
Glucose, UA: NEGATIVE
Hgb urine dipstick: NEGATIVE
Ketones, ur: NEGATIVE
pH: 8

## 2010-11-26 LAB — URINE CULTURE: Colony Count: NO GROWTH

## 2010-11-26 LAB — COMPREHENSIVE METABOLIC PANEL
ALT: 14
Alkaline Phosphatase: 52
CO2: 22
Chloride: 108
Glucose, Bld: 90
Potassium: 3.6
Sodium: 136
Total Protein: 5.8 — ABNORMAL LOW

## 2010-11-26 LAB — CBC
MCHC: 34.2
RBC: 3.64 — ABNORMAL LOW
WBC: 9.3

## 2010-11-29 LAB — CBC
HCT: 35.8 — ABNORMAL LOW
MCV: 92.7
Platelets: 241
RBC: 3.86 — ABNORMAL LOW

## 2010-11-29 LAB — RPR: RPR Ser Ql: NONREACTIVE

## 2010-11-30 LAB — CBC
Hemoglobin: 9.6 — ABNORMAL LOW
MCHC: 33.4
RBC: 3.08 — ABNORMAL LOW
WBC: 7.8

## 2010-12-01 LAB — URINALYSIS, ROUTINE W REFLEX MICROSCOPIC
Glucose, UA: NEGATIVE
Hgb urine dipstick: NEGATIVE
Specific Gravity, Urine: 1.025
pH: 6.5

## 2010-12-01 LAB — URINE MICROSCOPIC-ADD ON

## 2010-12-07 LAB — CBC
HCT: 38.7
Platelets: 280
RDW: 12.9
WBC: 7.3

## 2010-12-07 LAB — ABO/RH: ABO/RH(D): A POS

## 2010-12-16 LAB — POCT HEMOGLOBIN-HEMACUE: Operator id: 133231

## 2010-12-16 LAB — POCT PREGNANCY, URINE
Operator id: 268271
Preg Test, Ur: NEGATIVE

## 2012-02-29 DIAGNOSIS — J189 Pneumonia, unspecified organism: Secondary | ICD-10-CM

## 2012-02-29 HISTORY — DX: Pneumonia, unspecified organism: J18.9

## 2012-08-30 ENCOUNTER — Other Ambulatory Visit: Payer: Self-pay | Admitting: Obstetrics and Gynecology

## 2014-01-31 ENCOUNTER — Other Ambulatory Visit: Payer: Self-pay | Admitting: Obstetrics and Gynecology

## 2014-02-03 LAB — CYTOLOGY - PAP

## 2014-05-08 ENCOUNTER — Ambulatory Visit (INDEPENDENT_AMBULATORY_CARE_PROVIDER_SITE_OTHER): Payer: BLUE CROSS/BLUE SHIELD | Admitting: Neurology

## 2014-05-08 ENCOUNTER — Encounter: Payer: Self-pay | Admitting: Neurology

## 2014-05-08 VITALS — BP 152/93 | HR 84 | Temp 99.2°F | Resp 18 | Ht 62.0 in | Wt 230.0 lb

## 2014-05-08 DIAGNOSIS — G40409 Other generalized epilepsy and epileptic syndromes, not intractable, without status epilepticus: Secondary | ICD-10-CM

## 2014-05-08 DIAGNOSIS — F419 Anxiety disorder, unspecified: Secondary | ICD-10-CM

## 2014-05-08 DIAGNOSIS — G43009 Migraine without aura, not intractable, without status migrainosus: Secondary | ICD-10-CM | POA: Diagnosis not present

## 2014-05-08 DIAGNOSIS — R112 Nausea with vomiting, unspecified: Secondary | ICD-10-CM | POA: Diagnosis not present

## 2014-05-08 DIAGNOSIS — G4700 Insomnia, unspecified: Secondary | ICD-10-CM | POA: Diagnosis not present

## 2014-05-08 DIAGNOSIS — G40309 Generalized idiopathic epilepsy and epileptic syndromes, not intractable, without status epilepticus: Secondary | ICD-10-CM

## 2014-05-08 MED ORDER — PROMETHAZINE HCL 12.5 MG PO TABS: 12.5000 mg | ORAL_TABLET | Freq: Four times a day (QID) | ORAL | Status: DC | PRN

## 2014-05-08 NOTE — Progress Notes (Signed)
Subjective:    Patient ID: Traci [name] is a 39 y.o. female.  HPI     Huston Foley, MD, PhD [facility] [address], [address] [address] [place], [place] [address]  Dear Dr. Vincente Poli,   I saw your patient, Traci [name], upon your kind request in my neurologic clinic today for initial consultation of her seizure disorder. The patient is accompanied by her husband today. As you know, Traci [name] is a 39 year old right-handed woman with an underlying medical history of kidney stones , migraines, and recent concern for pelvic infection, treated with Rocephin, azithromycin and Flagyl, who has a history of seizures since age 39. She states she has grand mal seizures. She usually followed with Dr. August Saucer and had seen her for about 11 years. She requested a change in her neurologist. She's currently on Vimpat 200 mg twice daily. She feels she is having breakthrough seizures. She does not sleep well. For sleep she has been on Zanaflex as high as 6-12 12 mg. She takes Ativan 1 mg to 2 mg each night. For her migraines she has been on Zofran for her nausea which she feels does not help at all. She also has been on Stadol nasal spray and may take it every 60-90 minutes almost every day. She tries Advil over-the-counter first for her headache which is typically a throbbing headache with photophobia, nausea and vomiting and no auras typically. Her seizures are described as all over twitching. She can make abnormal sounds and smaller twitching in her sleep. Her husband says that she makes clicking sounds in her sleep and twitching movements of her hands and feet and sleep. She is usually not responsive. She has bitten her tongue and her sleep. She was stable on Topamax for about 10 years she says. Last year around May she started having more problems. She was previously treated with Depakote as a teenager and then Lamictal. She gained quite a bit of weight on Depakote. When she was 39 years  old she was treated with some medication but is not sure which one. She had a brain scan in the distant past. She is not sure whether it was completely normal but reports that there was no problem with any of her tests before. She had EEGs before. She had a 2 day ambulatory EEG fairly recently she states. She does not know the results. She has no family history of epilepsy. She currently does not feel well. She has nausea. She did not sleep well at night last night. She is anxious. She was treated with Inderal for prevention of her migraines and became very lightheaded and her blood pressure dropped. She also gained weight.  Her husband reports that she may have had a seizure last night.  She presented to the emergency room at [facility] on 04/26/2014 with pelvic and abdominal pain. I reviewed your office note from 05/01/2014, and emergency room records from [facility] from 04/26/2014. She had an elevated temperature at 99.8 at the time. CBC showed normal findings, CMP showed normal findings, urinalysis showed hazy urine with trace leukocyte esterase. She had a CT urogram on 04/26/2014 Unfortunately, I do not have any records available from her prior neurologist. The patient did not bring in any records today. I have asked her to sign a release of healthcare information form and we will get records from her previous neurologist office. She states that they were talking about placing a VNS next.   Of note, she  does not snore or have apneas while asleep. She does not drive and has not driven since last year. Her seizure frequency seems to increase the week before and week of her menstrual period.  Her Past Medical History Is Significant For: Past Medical History  Diagnosis Date  . Seizures   . Headache     Her Past Surgical History Is Significant For: Past Surgical History  Procedure Laterality Date  . Knee surgery    . Cholecystectomy    . Appendectomy    . Cesarean section      2   . Laparoscopy      Her Family History Is Significant For: Family History  Problem Relation Age of Onset  . Cancer Mother   . Diabetes Father   . Cancer Maternal Grandmother   . Cancer Maternal Grandfather   . Diabetes Paternal Grandmother   . Diabetes Paternal Grandfather     Her Social History Is Significant For: History   Social History  . Marital Status: Married    Spouse Name: [name]  . Number of Children: 2  . Years of Education: Some colle   Social History Main Topics  . Smoking status: Never Smoker   . Smokeless tobacco: Not on file  . Alcohol Use: No  . Drug Use: No  . Sexual Activity: Not on file   Other Topics Concern  . None   Social History Narrative    His Allergies Are:  No Known Allergies:   Her Current Medications Are:  Outpatient Encounter Prescriptions as of 05/08/2014  Medication Sig  . butorphanol (STADOL) 10 MG/ML nasal spray   . DULoxetine (CYMBALTA) 60 MG capsule Take 60 mg by mouth daily.  Marland Kitchen HYDROcodone-acetaminophen (NORCO/VICODIN) 5-325 MG per tablet Take 1 tablet by mouth every 4 (four) hours as needed for moderate pain.  Marland Kitchen HYDROmorphone (DILAUDID) 2 MG tablet   . ketorolac (TORADOL) 10 MG tablet Take 10 mg by mouth every 6 (six) hours as needed.  . lacosamide (VIMPAT) 200 MG TABS tablet Take 200 mg by mouth 2 (two) times daily.  Marland Kitchen LORazepam (ATIVAN) 1 MG tablet Take 1-2 mg by mouth at bedtime as needed for anxiety.  . norethindrone (MICRONOR,CAMILA,ERRIN) 0.35 MG tablet Take 1 tablet by mouth daily.  . ondansetron (ZOFRAN) 4 MG tablet Take 4 mg by mouth every 6 (six) hours as needed for nausea or vomiting.  . tizanidine (ZANAFLEX) 6 MG capsule Take 6-12 mg by mouth at bedtime and may repeat dose one time if needed.  . promethazine (PHENERGAN) 12.5 MG tablet Take 1 tablet (12.5 mg total) by mouth every 6 (six) hours as needed for nausea or vomiting.  :   Review of Systems:  Out of a complete 14 point review of systems, all are  reviewed and negative with the exception of these symptoms as listed below:   Review of Systems  Constitutional: Positive for fever, chills and fatigue.  HENT:       Spinning sensation  Eyes:       Blurred vision   Gastrointestinal: Positive for diarrhea and constipation.  Endocrine:       Flushing  Musculoskeletal:       Cramps   Skin:       Itching   Neurological: Positive for tremors, seizures, syncope, weakness and headaches.       Memory loss, Confusion, Dizziness, Insomnia, Sleepiness  Psychiatric/Behavioral:       Depression, Anxiety, Decreased energy    Objective:  Neurologic Exam  Physical Exam Physical Examination:   Filed Vitals:   05/08/14 0859  BP: 152/93  Pulse: 84  Temp: 99.2 F (37.3 C)  Resp: 18    General Examination: The patient is a very pleasant 39 y.o. female in no acute distress. She appears well-developed and well-nourished and adequately groomed. She is anxious appearing. She reports nausea.  HEENT: Normocephalic, atraumatic, pupils are equal, round and reactive to light and accommodation. Funduscopic exam is normal with sharp disc margins noted. Extraocular tracking is good without limitation to gaze excursion or nystagmus noted. Normal smooth pursuit is noted. Hearing is grossly intact. Tympanic membranes are clear bilaterally. Face is symmetric with normal facial animation and normal facial sensation. Speech is clear with no dysarthria noted. There is no hypophonia. There is no lip, neck/head, jaw or voice tremor. Neck is supple with full range of passive and active motion. There are no carotid bruits on auscultation. Oropharynx exam reveals: mild mouth dryness, adequate dental hygiene and mild airway crowding, due to narrow airway entry. Mallampati is class I. Tongue protrudes centrally and palate elevates symmetrically.   Chest: Clear to auscultation without wheezing, rhonchi or crackles noted.  Heart: S1+S2+0, regular and normal without  murmurs, rubs or gallops noted.   Abdomen: Soft, non-tender and non-distended with normal bowel sounds appreciated on auscultation.  Extremities: There is no pitting edema in the distal lower extremities bilaterally. Pedal pulses are intact.  Skin: Warm and dry without trophic changes noted. There are no varicose veins.  Musculoskeletal: exam reveals no obvious joint deformities, tenderness or joint swelling or erythema.   Neurologically:  Mental status: The patient is awake, alert and oriented in all 4 spheres. Her immediate and remote memory, attention, language skills and fund of knowledge are appropriate. There is no evidence of aphasia, agnosia, apraxia or anomia. Speech is clear with normal prosody and enunciation. Thought process is linear. Mood is anxious and affect is constricted.  Cranial nerves II - XII are as described above under HEENT exam. In addition: shoulder shrug is normal with equal shoulder height noted. Motor exam: Normal bulk, strength and tone is noted. There is no drift, tremor or rebound. Romberg is negative. Reflexes are 2+ throughout. Babinski: Toes are flexor bilaterally. Fine motor skills and coordination: intact with normal finger taps, normal hand movements, normal rapid alternating patting, normal foot taps and normal foot agility.  Cerebellar testing: No dysmetria or intention tremor on finger to nose testing. Heel to shin is unremarkable bilaterally. There is no truncal or gait ataxia.  Sensory exam: intact to light touch, pinprick, vibration, temperature sense in the upper and lower extremities.  Gait, station and balance: She stands easily. No veering to one side is noted. No leaning to one side is noted. Posture is age-appropriate and stance is narrow based. Gait shows normal stride length and normal pace. No problems turning are noted. She turns en bloc. Tandem walk is slightly difficult for her.                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 She was seen by neurologist locally, Dr. August Saucer for over 10 years. It is very unfortunate that I do not have any records available for review today and I feel that I am not doing the patient justice  today. Thankfully, her exam is largely nonfocal.She has had what sounds like appropriate workup for epilepsy and she may currently have intractable epilepsy. She was from what I understand never on dual therapy but has tried and failed at least 3 antiepileptic medications from what I hear. She's currently on Vimpat 400 mg daily. We will do some routine blood work. I would like to do a routine EEG at this time. We will call her with her test results. I had a long discussion with the patient and her husband and most of my 60 minute visit today was spent in counseling and coordination of care and discussing her issues. She has migraines. She currently has significant nausea and vomiting and I will prescribe Phenergan which has worked for her in the past. She is advised not to over use Stadol as it is addictive. I'm currently also not happy with her high-dose Zanaflex. I advised her that it is quite sedating. She is also on Cymbalta 60 mg once daily. She knows not to drive and we did talk about seizure precautions as well. I think she will be better served with an epilepsy specialist. I discussed this and they are in agreement. To that end I will request that she start seeing one of my colleagues, Dr. Vickey Hugerohmeier. We do need records from her previous neurologist office in the interim so we can review those prior to her next visit. She is scheduled for next week. In the interim, she will continue with her current medications. If between now and her next visit she has back-to-back seizures she will have to call 911 or go to the  emergency room. She may be a candidate for a longer video EEG monitoring. I will leave this up to Dr. Oliva Bustardohmeier's expertise. She may or may not be a candidate for a VNS. At this juncture, we will continue her current medications, call her with her test results, she can pick up Phenergan on her way home, and we will see her back in clinic next week. I will talk to Dr. Kandyce Rudomeier to help transition her care.  Thank you very much for allowing me to participate in the care of this nice patient. If I can be of any further assistance to you please do not hesitate to call me at [phone].  Sincerely,   Huston FoleySaima Makalia Bare, MD, PhD

## 2014-05-08 NOTE — Patient Instructions (Addendum)
We will do an EEG and blood work and will call you.  We will refer you to Dr. Vickey Hugerohmeier, epilepsy and VNS specialist.  I will prescribe phenergan for nausea.  Please remember, common seizure triggers are: Sleep deprivation, dehydration, overheating, stress, hypoglycemia or skipping meals, certain medications or excessive alcohol use, especially stopping alcohol abruptly if you have had heavy alcohol use before (aka alcohol withdrawal seizure). If you have a prolonged seizure over 2-5 minutes or back to back seizures, call or have someone call 911 or take you to the nearest emergency room. You cannot drive a car or operate any other machinery or vehicle within 6 months of a seizure. Please do not swim alone or take a tub bath for safety. Do not cook with large quantities of boiling water or oil for safety. Take your medicine for seizure prevention regularly and do not skip doses or stop medication abruptly and tone are told to do so by your healthcare provider. Try to get a refill on your antiepileptic medication ahead of time, so you are not at risk of running out. If you run out of the seizure medication and do not have a refill at hand she may run into medication withdrawal seizures. Avoid taking Wellbutrin, narcotic pain medications and tramadol, as they can lower seizure threshold.

## 2014-05-09 LAB — CBC WITH DIFFERENTIAL/PLATELET
BASOS ABS: 0 10*3/uL (ref 0.0–0.2)
Basos: 1 %
Eos: 2 %
Eosinophils Absolute: 0.1 10*3/uL (ref 0.0–0.4)
HCT: 45.5 % (ref 34.0–46.6)
HEMOGLOBIN: 14.6 g/dL (ref 11.1–15.9)
IMMATURE GRANS (ABS): 0 10*3/uL (ref 0.0–0.1)
Immature Granulocytes: 0 %
LYMPHS ABS: 1.9 10*3/uL (ref 0.7–3.1)
LYMPHS: 24 %
MCH: 29.6 pg (ref 26.6–33.0)
MCHC: 32.1 g/dL (ref 31.5–35.7)
MCV: 92 fL (ref 79–97)
Monocytes Absolute: 0.8 10*3/uL (ref 0.1–0.9)
Monocytes: 10 %
NEUTROS PCT: 63 %
Neutrophils Absolute: 5 10*3/uL (ref 1.4–7.0)
PLATELETS: 431 10*3/uL — AB (ref 150–379)
RBC: 4.93 x10E6/uL (ref 3.77–5.28)
RDW: 13.4 % (ref 12.3–15.4)
WBC: 7.9 10*3/uL (ref 3.4–10.8)

## 2014-05-09 LAB — COMPREHENSIVE METABOLIC PANEL
ALBUMIN: 4.6 g/dL (ref 3.5–5.5)
ALK PHOS: 64 IU/L (ref 39–117)
ALT: 35 IU/L — ABNORMAL HIGH (ref 0–32)
AST: 20 IU/L (ref 0–40)
Albumin/Globulin Ratio: 1.6 (ref 1.1–2.5)
BILIRUBIN TOTAL: 0.4 mg/dL (ref 0.0–1.2)
BUN / CREAT RATIO: 17 (ref 8–20)
BUN: 16 mg/dL (ref 6–20)
CALCIUM: 9.8 mg/dL (ref 8.7–10.2)
CHLORIDE: 100 mmol/L (ref 97–108)
CO2: 24 mmol/L (ref 18–29)
Creatinine, Ser: 0.94 mg/dL (ref 0.57–1.00)
GFR calc Af Amer: 90 mL/min/{1.73_m2} (ref >59)
GFR calc non Af Amer: 78 mL/min/{1.73_m2} (ref >59)
Globulin, Total: 2.8 g/dL (ref 1.5–4.5)
Glucose: 111 mg/dL — ABNORMAL HIGH (ref 65–99)
Potassium: 4.3 mmol/L (ref 3.5–5.2)
Sodium: 140 mmol/L (ref 134–144)
Total Protein: 7.4 g/dL (ref 6.0–8.5)

## 2014-05-09 LAB — TSH: TSH: 1.26 u[IU]/mL (ref 0.450–4.500)

## 2014-05-09 NOTE — Progress Notes (Signed)
Quick Note:  Please call patient or her husband: Labs were fine, with the exception of borderline blood sugar at 111, borderline liver enzyme called ALT, which usually means that she is taking chronic medications, thyroid function screening test normal, and blood platelets mildly elevated. This means it has to be rechecked at some point, probably best to have it rechecked by her primary care physician in the next 1-3 months. No other action is required at this time. Huston FoleySaima Carmesha Morocco, MD, PhD [facility] ([facility]  ______

## 2014-05-12 ENCOUNTER — Telehealth: Payer: Self-pay

## 2014-05-12 NOTE — Telephone Encounter (Signed)
Talked to Alycia RossettiRyan (husband), permission from patient. Gave him lab results. He states that he understands and will relay to RinconStephanie.

## 2014-05-12 NOTE — Telephone Encounter (Signed)
-----   Message from Huston FoleySaima Athar, MD sent at 05/09/2014  8:44 AM EST ----- Please call patient or her husband: Labs were fine, with the exception of borderline blood sugar at 111, borderline liver enzyme called ALT, which usually means that she is taking chronic medications, thyroid function screening test normal, and blood platelets mildly elevated. This means it has to be rechecked at some point, probably best to have it rechecked by her primary care physician in the next 1-3 months. No other action is required at this time. Huston FoleySaima Athar, MD, PhD [facility])

## 2014-05-13 ENCOUNTER — Ambulatory Visit (INDEPENDENT_AMBULATORY_CARE_PROVIDER_SITE_OTHER): Payer: BLUE CROSS/BLUE SHIELD | Admitting: Neurology

## 2014-05-13 DIAGNOSIS — G40409 Other generalized epilepsy and epileptic syndromes, not intractable, without status epilepticus: Secondary | ICD-10-CM | POA: Diagnosis not present

## 2014-05-13 DIAGNOSIS — G40B11 Juvenile myoclonic epilepsy, intractable, with status epilepticus: Secondary | ICD-10-CM

## 2014-05-13 DIAGNOSIS — R112 Nausea with vomiting, unspecified: Secondary | ICD-10-CM

## 2014-05-13 DIAGNOSIS — G43009 Migraine without aura, not intractable, without status migrainosus: Secondary | ICD-10-CM

## 2014-05-13 DIAGNOSIS — G40309 Generalized idiopathic epilepsy and epileptic syndromes, not intractable, without status epilepticus: Secondary | ICD-10-CM

## 2014-05-14 ENCOUNTER — Encounter: Payer: Self-pay | Admitting: Neurology

## 2014-05-14 ENCOUNTER — Ambulatory Visit (INDEPENDENT_AMBULATORY_CARE_PROVIDER_SITE_OTHER): Payer: BLUE CROSS/BLUE SHIELD | Admitting: Neurology

## 2014-05-14 VITALS — BP 127/84 | HR 89 | Ht 62.0 in | Wt 233.2 lb

## 2014-05-14 DIAGNOSIS — G40B19 Juvenile myoclonic epilepsy, intractable, without status epilepticus: Secondary | ICD-10-CM

## 2014-05-14 MED ORDER — TOPIRAMATE 25 MG PO TABS: 25.0000 mg | ORAL_TABLET | Freq: Two times a day (BID) | ORAL | Status: DC

## 2014-05-14 MED ORDER — CLONAZEPAM 1 MG PO TABS: 1.0000 mg | ORAL_TABLET | Freq: Every day | ORAL | Status: DC

## 2014-05-14 NOTE — Progress Notes (Signed)
Provider:  Melvyn Novas, M D  Referring Provider: Heywood Bene, MD Primary Care Physician:  Lucila Maine, MD    HPI:  Traci [name] is a 39 y.o. female  Is seen here as a referral   from Dr. Lorin Picket for a second opinion consult about epilepsy,  Mrs. Kachmar has been diagnosed with seizures since age 56. Her very first seizure happened while her mother was present at home she had been painting her nails and her mother was concerned that she may have inhaled or drunk  some of the organic solvent. She is now 39 years old right-handed Caucasian female with an underlying medical history of kidney stones migraines and a recent concern for pelvic infection treated with Rocephin, azithromycin and Flagyl. His have always been primary generalized according to her description and she was originally treated with topiramate for 11 years she had about one seizure every 3 months which was a good control. She was also treated with Depakote as a teenager which she had a lot of concerns regarding him tablet echogenicity, and was then switched to Lamictal. She had also gained quite a bit of weight on Depakote. Treated on she is now on Vimpat. Her seizures are described as all over twitching or jerking movements sometimes she will create an abnormal sound sometimes she will twitching her sleep. Her husband has observed clicking sounds and twitching movements of the hand and feet while asleep. She is usually not responsive. She has had bruising unexplained in the morning she has bitten her tongue during sleep. Her husband videotaped some of these activities and I can also see that she is opening her eyes slightly and has rolling eye movements left-to-right before she begins clicking and her left hand is twitching.   Yesterday a 32 minute EEG was performed, which I interpreted. This 32 minutes the patient had 15 spells of 3 Hz spike and wave discharges but none lasting longer than 2 seconds most of them  lasting just 1 second. They did not generalize from there on. They appeared to be high amplitude over the left hemisphere than right. They appeared more central temporal and posterior to me. However within a fraction of a second these  Generalized. 4 she was successfully treated with topiramate her seizures were generalized without any aura come on and would become tonic clonic seizures. She was unresponsive and they were followed by a postictal confusion. Over the last couple of months her seizures have changed. She can almost tell now when one is coming on. She will be staring off, she will not have verbal output, and she will have jerking over the upper extremities. When she is working on her phone for example she may throw it up in the air in this jerking movement. She threw a bowel of  cereal on the kitchen floor.  Not worse in the morning and that for example have not occurred when she brushes her teeth or is getting dressed. She has had some after her morning shower and it seems that when she gets hot this may be a trigger factor for her. Stress and sleep deprivation are identified as her main triggers and there is a possible menstrual cycle trigger as well. However photic stimulation is very uncomfortable to her and she cannot look into bright sunlight or feels somewhat queasy when driving and seeing the trees  passing by - creating a strobe light effect.  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 While many patients grew out of it some do not -as is  Mrs. Judeth CornfieldStephanie Millett's case .  In Dr. Virgina EvenerAathar's  visit some of the medications were changed and she is now currently taking Vimpat 200 mg 2 times a day, she has Ativan for when necessary use, she stopped taking  Cymbalta as well as Zanaflex.         Review of Systems: Out of a complete 14 system review, the patient complains of only the following symptoms, and all other reviewed systems are negative. JME and  migraine headaches.   Frequent morning headaches and they get worse as the day goes on. Photophobia. Nausea.   History   Social History  . Marital Status: Married    Spouse Name: Alycia RossettiRyan  . Number of Children: 2  . Years of Education: Some colle   Occupational History  . Not on file.   Social History Main Topics  . Smoking status: Never Smoker   . Smokeless tobacco: Not on file  . Alcohol Use: No  . Drug Use: No  . Sexual Activity: Not on file   Other Topics Concern  . Not on file   Social History Narrative    Family History  Problem Relation Age of Onset  . Cancer Mother   . Diabetes Father   . Cancer Maternal Grandmother   . Cancer Maternal Grandfather   . Diabetes Paternal Grandmother   . Diabetes Paternal Grandfather     Past Medical History  Diagnosis Date  . Seizures   . Headache     Past Surgical History  Procedure Laterality Date  . Knee surgery    . Cholecystectomy    . Appendectomy    . Cesarean section      2  . Laparoscopy      Current Outpatient Prescriptions  Medication Sig Dispense Refill  . butorphanol (STADOL) 10 MG/ML nasal spray Place 1 spray into the nose. Every 60-90 min prn  4  . DULoxetine (CYMBALTA) 60 MG capsule Take 60 mg by mouth daily.    Marland Kitchen. HYDROmorphone (DILAUDID) 2 MG tablet Take 2 mg by mouth every 6 (six) hours as needed.   0  . lacosamide (VIMPAT) 200 MG TABS tablet Take 200 mg by mouth 2 (two) times daily.    Marland Kitchen. LORazepam (ATIVAN) 1 MG tablet Take 1-2 mg by mouth at bedtime as needed for anxiety.    . promethazine (PHENERGAN) 12.5 MG tablet Take 1 tablet (12.5 mg total) by mouth every 6 (six) hours as needed for nausea or vomiting. 30 tablet 0  . tizanidine (ZANAFLEX) 6 MG capsule Take 6-12 mg by mouth at bedtime and may repeat dose one time if needed.     No current facility-administered medications for this visit.    Allergies as of 05/14/2014  . (No Known Allergies)    Vitals: BP 127/84 mmHg  Pulse 89  Ht 5\' 2"   (1.575 m)  Wt 233 lb 3.2 oz (105.779 kg)  BMI 42.64 kg/m2 Last Weight:  Wt Readings from Last 1 Encounters:  05/14/14 233 lb 3.2 oz (105.779 kg)   Last Height:   Ht Readings from Last 1 Encounters:  05/14/14 5\' 2"  (1.575 m)    Physical exam:  General: The patient is awake, alert and appears not in acute distress. The patient is well groomed. Head: Normocephalic, atraumatic. Neck is supple. Mallampati 3 , neck circumference: 14.5  Cardiovascular:  Regular rate and rhythm* without  murmurs or  carotid bruit, and without distended neck veins. Respiratory: Lungs are clear to auscultation. Skin:  Without evidence of edema, or rash Trunk: BMI is  elevated and patient  has normal posture.  Neurologic exam : The patient is awake and alert, oriented to place and time.  Memory subjective described as intact. Amnesia for her recent hospital visit, pain medication induced. There is a normal attention span & concentration ability. Speech is fluent without  dysarthria, dysphonia or aphasia. Mood and affect are appropriate.  Cranial nerves: Pupils are equal and briskly reactive to light. Funduscopic exam without evidence of pallor or edema. Extraocular movements  in vertical and horizontal planes intact and without nystagmus.  Visual fields by finger perimetry are intact. Hearing to finger rub intact.  Facial sensation intact to fine touch. Facial motor strength is symmetric and tongue and uvula move midline. Tongue protrusion into either cheek is normal. Shoulder shrug is normal.   Motor exam:  Normal tone, muscle bulk and symmetric  strength in all extremities.  Sensory:  Fine touch, pinprick and vibration were tested in all extremities.  Proprioception was normal.  Coordination: Rapid alternating movements in the fingers/hands were normal. Finger-to-nose maneuver  normal without evidence of ataxia, dysmetria or tremor.  Gait and station: Patient walks without assistive device and is able  unassisted to climb up to the exam table. Strength within normal limits. Stance is stable and normal. Tandem gait is unfragmented. Romberg testing is negative   Deep tendon reflexes: in the upper and lower extremities are symmetric and intact. Babinski maneuver response is downgoing.   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 Worsening since may 2015, and worsened again by a pelvic infection and the anti biotics.   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   Plan:  Treatment plan and additional workup :   Vimpat 200 mg bid continued. Follow liver enzyme. Can not add TPM due to renal stones. I still think this is her best choice for add on.  Klonopin.  Will check CMET, CBC in 6 weeks again with RV ,           Melvyn Novas MD 05/14/2014

## 2014-05-19 DIAGNOSIS — G40B11 Juvenile myoclonic epilepsy, intractable, with status epilepticus: Secondary | ICD-10-CM | POA: Insufficient documentation

## 2014-05-19 NOTE — Procedures (Signed)
[facility]  EEG (ELECTROENCEPHALOGRAM) REPORT   STUDY DATE:   05-13-14  PATIENT NAME: Traci [name]   MRN: [id number] EEG number,  medical record # [id number]   ORDERING CLINICIAN: Melvyn Novasarmen Faith Patricelli, MD   TECHNOLOGIST: Yetta BarreJones TECHNIQUE: Electroencephalogram was recorded utilizing standard 10-20 system of lead placement and reformatted into average and bipolar montages.   RECORDING TIME: 33 minutes ACTIVATION:  strobe lights and hyperventilation    CLINICAL INFORMATION:  [name]'s begun having seizures at the age of 39. She is referred by her gynecologist Dr. Marikay AlarMichaud Gravol. She is meanwhile 39 years old. For about 11 years she was well controlled on topiramate until she developed kidney stones and the medication had to be changed. She then developed breakthrough seizures on Vimpat 200 mg twice daily and  even taking  additional Ativan at night. The patient presented to the emergency room at [facility] on 04-26-14 with pelvic and abdominal pain. The patient had developed a pelvic infection , was treated with azithromycin, Flagyl, and Rocephin and during this time of infection her seizure disorder exacerbated further.  This may be due to the infection itself lowering the  seizure threshold, but it is also possible that the antibiotics reduced the efficiency of her antiepileptic medications.  She has by now about 9 months of progressive worsening of epileptic activity.   PS: As a teenager she had been on Depakote and then Lamictal. She remembers that she gained weight on Depakote and became slightly tremulous, she has not been sure why Lamictal was discontinued.  She had a 2 day ambulatory EEG recording with Dr. Arville Gohristine Dean in [place], but  Doesn't know the results. She as she is unable to drive and has to look for a local neurologist .  She was seen by Dr Frances FurbishAthar , who referred her to me.     FINDINGS: EEG for a posterior dominant rhythm of 8 Hz, some  frontopolar beater fast activity was also noted, attributed to benzodiazepine use. The patient remained responsive throughout the EEG but had electrographic seizures. Remarkable was the activity of spike and hertz discharges at 3 and 4 Hz total. There was always a single spike preceding the epileptiform discharge none of the 15 discharges noted during this 30 minute EEG lasted over 2 seconds. It therefore never generalized into a visible seizure activity. The patient was able to respond verbally to questions asked while this pattern persisted on screen. Her EKG remained in normal sinus rhythm at 86 bpm.     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 The highest amplitude of epileptiform activity was over  bilateral frontal areas.  WU:[id number]:have printed multiple screen shots that shall be scanned into the EMR.        Melvyn Novasarmen Cynara Tatham , MD

## 2014-06-16 ENCOUNTER — Other Ambulatory Visit (HOSPITAL_COMMUNITY): Payer: Self-pay | Admitting: Neurosurgery

## 2014-06-25 NOTE — Pre-Procedure Instructions (Signed)
Traci PantherStephanie N [name]  06/25/2014   Your procedure is scheduled on: Friday Jul 04, 2014 at 3:20 PM.  Report [facility] Admitting at 12:15 PM.  Call this number if you have problems the morning of surgery: [phone]   Remember:   Do not eat food or drink liquids after midnight.   Take these medicines the morning of surgery with A SIP OF WATER: Butorphanol (Stadol) if needed for headache, Lacosamide (Vimpat), and Topiramate (Topamax)   Please stop taking any Ibuprofen, Advil, Motrin, Alleve, vitamins, herbal medications on Friday April 29th   Do not wear jewelry, make-up or nail polish.  Do not wear lotions, powders, or perfumes. You may NOT wear deodorant.  Do not shave 48 hours prior to surgery.   Do not bring valuables to the hospital.  [facility] is not responsible for any belongings or valuables.               Contacts, dentures or bridgework may not be worn into surgery.  Leave suitcase in the car. After surgery it may be brought to your room.  For patients admitted to the hospital, discharge time is determined by your treatment team.               Patients discharged the day of surgery will not be allowed to drive home.  Name and phone number of your driver:   Special Instructions: Shower using CHG soap the night before and the morning of your surgery   Please read over the following fact sheets that you were given: Pain Booklet, Coughing and Deep Breathing and Surgical Site Infection Prevention

## 2014-06-26 ENCOUNTER — Encounter (HOSPITAL_COMMUNITY)
Admission: RE | Admit: 2014-06-26 | Discharge: 2014-06-26 | Disposition: A | Discharge status: Home / Self Care | Payer: BLUE CROSS/BLUE SHIELD | Source: Ambulatory Visit | Attending: Neurosurgery | Admitting: Neurosurgery

## 2014-06-26 ENCOUNTER — Telehealth: Payer: Self-pay

## 2014-06-26 ENCOUNTER — Encounter (HOSPITAL_COMMUNITY): Payer: Self-pay

## 2014-06-26 DIAGNOSIS — Z01812 Encounter for preprocedural laboratory examination: Secondary | ICD-10-CM | POA: Diagnosis not present

## 2014-06-26 DIAGNOSIS — R569 Unspecified convulsions: Secondary | ICD-10-CM | POA: Diagnosis not present

## 2014-06-26 HISTORY — DX: Interstitial cystitis (chronic) without hematuria: N30.10

## 2014-06-26 HISTORY — DX: Personal history of urinary calculi: Z87.442

## 2014-06-26 LAB — CBC
HEMATOCRIT: 42.6 % (ref 36.0–46.0)
Hemoglobin: 14.6 g/dL (ref 12.0–15.0)
MCH: 30.4 pg (ref 26.0–34.0)
MCHC: 34.3 g/dL (ref 30.0–36.0)
MCV: 88.8 fL (ref 78.0–100.0)
PLATELETS: 323 10*3/uL (ref 150–400)
RBC: 4.8 MIL/uL (ref 3.87–5.11)
RDW: 12.7 % (ref 11.5–15.5)
WBC: 6.2 10*3/uL (ref 4.0–10.5)

## 2014-06-26 LAB — BASIC METABOLIC PANEL
Anion gap: 9 (ref 5–15)
BUN: 9 mg/dL (ref 6–23)
CHLORIDE: 107 mmol/L (ref 96–112)
CO2: 22 mmol/L (ref 19–32)
Calcium: 9.8 mg/dL (ref 8.4–10.5)
Creatinine, Ser: 0.87 mg/dL (ref 0.50–1.10)
GFR calc Af Amer: >90 mL/min (ref >90)
GFR calc non Af Amer: 84 mL/min — ABNORMAL LOW (ref >90)
GLUCOSE: 100 mg/dL — AB (ref 70–99)
POTASSIUM: 3.9 mmol/L (ref 3.5–5.1)
SODIUM: 138 mmol/L (ref 135–145)

## 2014-06-26 LAB — HCG, SERUM, QUALITATIVE: Preg, Serum: NEGATIVE

## 2014-06-26 NOTE — Telephone Encounter (Signed)
Confirmed with Dr. Vickey Hugerohmeier that the 07/09/14 appt is ok for VNS generator turn on.

## 2014-06-26 NOTE — Telephone Encounter (Signed)
Called pt to confirm surgery for VNS placement is 07/04/14. Pt confirmed. Pt has already scheduled a 07/09/14 appt here and wants to keep that appt to turn generator on. Will check with Dr. Vickey Hugerohmeier that this is ok.

## 2014-06-26 NOTE — Progress Notes (Signed)
Patient denied having any cardiac or pulmonary issues. PCP is [name] in [place], [place].   Patient informed Nurse that she had a seizure yesterday and before that she had a seizure on Sunday. Patient stated "my seizures used to be well controlled, but here in the past year they haven't been......that's why I need to get this done." Husband at chair side during PAT visit.

## 2014-07-04 ENCOUNTER — Ambulatory Visit (HOSPITAL_COMMUNITY)
Admission: RE | Admit: 2014-07-04 | Discharge: 2014-07-04 | Disposition: A | Discharge status: Home / Self Care | Payer: BLUE CROSS/BLUE SHIELD | Source: Ambulatory Visit | Attending: Neurosurgery | Admitting: Neurosurgery

## 2014-07-04 ENCOUNTER — Encounter (HOSPITAL_COMMUNITY)
Admission: RE | Disposition: A | Discharge status: Home / Self Care | Payer: Self-pay | Source: Ambulatory Visit | Attending: Neurosurgery

## 2014-07-04 ENCOUNTER — Encounter (HOSPITAL_COMMUNITY): Payer: Self-pay | Admitting: *Deleted

## 2014-07-04 ENCOUNTER — Ambulatory Visit (HOSPITAL_COMMUNITY): Payer: BLUE CROSS/BLUE SHIELD | Admitting: Certified Registered Nurse Anesthetist

## 2014-07-04 DIAGNOSIS — N3010 Interstitial cystitis (chronic) without hematuria: Secondary | ICD-10-CM | POA: Diagnosis not present

## 2014-07-04 DIAGNOSIS — Z87442 Personal history of urinary calculi: Secondary | ICD-10-CM | POA: Diagnosis not present

## 2014-07-04 DIAGNOSIS — Z79899 Other long term (current) drug therapy: Secondary | ICD-10-CM | POA: Insufficient documentation

## 2014-07-04 DIAGNOSIS — G40209 Localization-related (focal) (partial) symptomatic epilepsy and epileptic syndromes with complex partial seizures, not intractable, without status epilepticus: Secondary | ICD-10-CM | POA: Insufficient documentation

## 2014-07-04 DIAGNOSIS — G40919 Epilepsy, unspecified, intractable, without status epilepticus: Secondary | ICD-10-CM | POA: Diagnosis present

## 2014-07-04 HISTORY — PX: VAGUS NERVE STIMULATOR INSERTION: SHX348

## 2014-07-04 SURGERY — VAGAL NERVE STIMULATOR IMPLANT
Anesthesia: General

## 2014-07-04 MED ORDER — THROMBIN 5000 UNITS EX SOLR
CUTANEOUS | Status: DC | PRN
  Administered 2014-07-04 (×2): 5000 [IU] via TOPICAL

## 2014-07-04 MED ORDER — GLYCOPYRROLATE 0.2 MG/ML IJ SOLN
INTRAMUSCULAR | Status: DC | PRN
  Administered 2014-07-04: .4 mg via INTRAVENOUS

## 2014-07-04 MED ORDER — PROPOFOL 10 MG/ML IV BOLUS
INTRAVENOUS | Status: AC
  Filled 2014-07-04: qty 20

## 2014-07-04 MED ORDER — HYDROMORPHONE HCL 1 MG/ML IJ SOLN
INTRAMUSCULAR | Status: AC
  Filled 2014-07-04: qty 1

## 2014-07-04 MED ORDER — SCOPOLAMINE 1 MG/3DAYS TD PT72
MEDICATED_PATCH | TRANSDERMAL | Status: AC
  Administered 2014-07-04: 1 via TRANSDERMAL
  Filled 2014-07-04: qty 1

## 2014-07-04 MED ORDER — PROMETHAZINE HCL 25 MG/ML IJ SOLN
INTRAMUSCULAR | Status: DC
Start: 2014-07-04 — End: 2014-07-05
  Filled 2014-07-04: qty 1

## 2014-07-04 MED ORDER — MIDAZOLAM HCL 2 MG/2ML IJ SOLN
INTRAMUSCULAR | Status: AC
  Filled 2014-07-04: qty 2

## 2014-07-04 MED ORDER — PROPOFOL 10 MG/ML IV BOLUS
INTRAVENOUS | Status: DC | PRN
  Administered 2014-07-04: 200 mg via INTRAVENOUS

## 2014-07-04 MED ORDER — OXYCODONE-ACETAMINOPHEN 10-325 MG PO TABS: 1.0000 | ORAL_TABLET | ORAL | Status: DC | PRN

## 2014-07-04 MED ORDER — HYDROMORPHONE HCL 1 MG/ML IJ SOLN
INTRAMUSCULAR | Status: AC
  Administered 2014-07-04: 0.5 mg via INTRAVENOUS
  Filled 2014-07-04: qty 1

## 2014-07-04 MED ORDER — PROMETHAZINE HCL 25 MG/ML IJ SOLN: 6.2500 mg | Freq: Once | INTRAMUSCULAR | Status: DC

## 2014-07-04 MED ORDER — NEOSTIGMINE METHYLSULFATE 10 MG/10ML IV SOLN
INTRAVENOUS | Status: AC
  Filled 2014-07-04: qty 1

## 2014-07-04 MED ORDER — STERILE WATER FOR INJECTION IJ SOLN
INTRAMUSCULAR | Status: AC
  Filled 2014-07-04: qty 10

## 2014-07-04 MED ORDER — 0.9 % SODIUM CHLORIDE (POUR BTL) OPTIME
TOPICAL | Status: DC | PRN
  Administered 2014-07-04: 1000 mL

## 2014-07-04 MED ORDER — SODIUM CHLORIDE 0.9 % IR SOLN
Status: DC | PRN
  Administered 2014-07-04: 14:00:00

## 2014-07-04 MED ORDER — FENTANYL CITRATE (PF) 100 MCG/2ML IJ SOLN
INTRAMUSCULAR | Status: DC | PRN
  Administered 2014-07-04 (×2): 50 ug via INTRAVENOUS
  Administered 2014-07-04: 100 ug via INTRAVENOUS
  Administered 2014-07-04: 50 ug via INTRAVENOUS

## 2014-07-04 MED ORDER — FENTANYL CITRATE (PF) 250 MCG/5ML IJ SOLN
INTRAMUSCULAR | Status: AC
  Filled 2014-07-04: qty 5

## 2014-07-04 MED ORDER — BUPIVACAINE HCL 0.5 % IJ SOLN
INTRAMUSCULAR | Status: DC | PRN
  Administered 2014-07-04: 20 mL

## 2014-07-04 MED ORDER — MIDAZOLAM HCL 5 MG/5ML IJ SOLN
INTRAMUSCULAR | Status: DC | PRN
  Administered 2014-07-04: 2 mg via INTRAVENOUS

## 2014-07-04 MED ORDER — CEFAZOLIN SODIUM-DEXTROSE 2-3 GM-% IV SOLR
INTRAVENOUS | Status: DC | PRN
  Administered 2014-07-04: 2 g via INTRAVENOUS

## 2014-07-04 MED ORDER — OXYCODONE-ACETAMINOPHEN 5-325 MG PO TABS
ORAL_TABLET | ORAL | Status: AC
  Filled 2014-07-04: qty 2

## 2014-07-04 MED ORDER — GLYCOPYRROLATE 0.2 MG/ML IJ SOLN
INTRAMUSCULAR | Status: AC
  Filled 2014-07-04: qty 3

## 2014-07-04 MED ORDER — HYDROMORPHONE HCL 1 MG/ML IJ SOLN
0.5000 mg | INTRAMUSCULAR | Status: AC | PRN
  Administered 2014-07-04 (×4): 0.5 mg via INTRAVENOUS

## 2014-07-04 MED ORDER — METOCLOPRAMIDE HCL 5 MG/ML IJ SOLN
INTRAMUSCULAR | Status: DC | PRN
  Administered 2014-07-04: 5 mg via INTRAVENOUS

## 2014-07-04 MED ORDER — GLYCOPYRROLATE 0.2 MG/ML IJ SOLN
INTRAMUSCULAR | Status: AC
  Filled 2014-07-04: qty 2

## 2014-07-04 MED ORDER — ARTIFICIAL TEARS OP OINT
TOPICAL_OINTMENT | OPHTHALMIC | Status: AC
  Filled 2014-07-04: qty 3.5

## 2014-07-04 MED ORDER — LIDOCAINE-EPINEPHRINE 1 %-1:100000 IJ SOLN
INTRAMUSCULAR | Status: DC | PRN
  Administered 2014-07-04: 20 mL

## 2014-07-04 MED ORDER — HEMOSTATIC AGENTS (NO CHARGE) OPTIME
TOPICAL | Status: DC | PRN
  Administered 2014-07-04: 1 via TOPICAL

## 2014-07-04 MED ORDER — BUTORPHANOL TARTRATE 2 MG/ML IJ SOLN
2.0000 mg | Freq: Once | INTRAMUSCULAR | Status: AC
  Administered 2014-07-04: 2 mg via INTRAVENOUS

## 2014-07-04 MED ORDER — THROMBIN 5000 UNITS EX SOLR
OROMUCOSAL | Status: DC | PRN
  Administered 2014-07-04: 16:00:00 via TOPICAL

## 2014-07-04 MED ORDER — ESMOLOL HCL 10 MG/ML IV SOLN
INTRAVENOUS | Status: DC | PRN
  Administered 2014-07-04 (×2): 20 mg via INTRAVENOUS

## 2014-07-04 MED ORDER — EPHEDRINE SULFATE 50 MG/ML IJ SOLN
INTRAMUSCULAR | Status: AC
  Filled 2014-07-04: qty 1

## 2014-07-04 MED ORDER — ROCURONIUM BROMIDE 100 MG/10ML IV SOLN
INTRAVENOUS | Status: DC | PRN
  Administered 2014-07-04: 25 mg via INTRAVENOUS
  Administered 2014-07-04: 15 mg via INTRAVENOUS

## 2014-07-04 MED ORDER — SUCCINYLCHOLINE CHLORIDE 20 MG/ML IJ SOLN
INTRAMUSCULAR | Status: DC | PRN
  Administered 2014-07-04: 100 mg via INTRAVENOUS

## 2014-07-04 MED ORDER — LACTATED RINGERS IV SOLN
INTRAVENOUS | Status: DC | PRN
  Administered 2014-07-04 (×2): via INTRAVENOUS

## 2014-07-04 MED ORDER — ONDANSETRON HCL 4 MG/2ML IJ SOLN
INTRAMUSCULAR | Status: DC | PRN
  Administered 2014-07-04: 4 mg via INTRAVENOUS

## 2014-07-04 MED ORDER — METOCLOPRAMIDE HCL 5 MG/ML IJ SOLN
INTRAMUSCULAR | Status: AC
  Filled 2014-07-04: qty 2

## 2014-07-04 MED ORDER — NEOSTIGMINE METHYLSULFATE 10 MG/10ML IV SOLN
INTRAVENOUS | Status: DC | PRN
  Administered 2014-07-04: 3 mg via INTRAVENOUS

## 2014-07-04 MED ORDER — ONDANSETRON HCL 4 MG/2ML IJ SOLN
INTRAMUSCULAR | Status: AC
Start: 2014-07-04 — End: 2014-07-04
  Filled 2014-07-04: qty 2

## 2014-07-04 MED ORDER — LIDOCAINE HCL (CARDIAC) 20 MG/ML IV SOLN
INTRAVENOUS | Status: AC
  Filled 2014-07-04: qty 20

## 2014-07-04 MED ORDER — LIDOCAINE HCL (CARDIAC) 20 MG/ML IV SOLN
INTRAVENOUS | Status: DC | PRN
  Administered 2014-07-04: 100 mg via INTRAVENOUS

## 2014-07-04 MED ORDER — ROCURONIUM BROMIDE 50 MG/5ML IV SOLN
INTRAVENOUS | Status: AC
Start: 2014-07-04 — End: 2014-07-04
  Filled 2014-07-04: qty 1

## 2014-07-04 SURGICAL SUPPLY — 67 items
ADH SKN CLS APL DERMABOND .7 (GAUZE/BANDAGES/DRESSINGS) ×2
APL SKNCLS STERI-STRIP NONHPOA (GAUZE/BANDAGES/DRESSINGS)
BAG DECANTER FOR FLEXI CONT (MISCELLANEOUS) ×3 IMPLANT
BENZOIN TINCTURE PRP APPL 2/3 (GAUZE/BANDAGES/DRESSINGS) IMPLANT
BLADE CLIPPER SURG (BLADE) IMPLANT
BLADE SURG 11 STRL SS (BLADE) ×3 IMPLANT
CANISTER SUCT 3000ML PPV (MISCELLANEOUS) ×3 IMPLANT
CLOSURE WOUND 1/2 X4 (GAUZE/BANDAGES/DRESSINGS)
CONT SPEC 4OZ CLIKSEAL STRL BL (MISCELLANEOUS) ×3 IMPLANT
DECANTER SPIKE VIAL GLASS SM (MISCELLANEOUS) ×3 IMPLANT
DERMABOND ADVANCED (GAUZE/BANDAGES/DRESSINGS) ×4
DERMABOND ADVANCED .7 DNX12 (GAUZE/BANDAGES/DRESSINGS) IMPLANT
DRAPE LAPAROTOMY 100X72 PEDS (DRAPES) ×3 IMPLANT
DRAPE MICROSCOPE LEICA (MISCELLANEOUS) IMPLANT
DRAPE POUCH INSTRU U-SHP 10X18 (DRAPES) ×3 IMPLANT
DRAPE PROXIMA HALF (DRAPES) IMPLANT
DRAPE SURG IRRIG POUCH 19X23 (DRAPES) ×2 IMPLANT
DRSG OPSITE POSTOP 3X4 (GAUZE/BANDAGES/DRESSINGS) ×3 IMPLANT
DRSG OPSITE POSTOP 4X6 (GAUZE/BANDAGES/DRESSINGS) ×4 IMPLANT
DRSG TEGADERM 4X4.75 (GAUZE/BANDAGES/DRESSINGS) ×12 IMPLANT
DURAPREP 6ML APPLICATOR 50/CS (WOUND CARE) ×3 IMPLANT
ELECT COATED BLADE 2.86 ST (ELECTRODE) ×3 IMPLANT
ELECT REM PT RETURN 9FT ADLT (ELECTROSURGICAL) ×3
ELECTRODE REM PT RTRN 9FT ADLT (ELECTROSURGICAL) ×1 IMPLANT
GAUZE SPONGE 4X4 16PLY XRAY LF (GAUZE/BANDAGES/DRESSINGS) IMPLANT
GENERATOR MODEL 106 ASPIRE (Neuro Prosthesis/Implant) ×2 IMPLANT
GLOVE BIOGEL PI IND STRL 6.5 (GLOVE) IMPLANT
GLOVE BIOGEL PI IND STRL 7.5 (GLOVE) ×1 IMPLANT
GLOVE BIOGEL PI INDICATOR 6.5 (GLOVE) ×4
GLOVE BIOGEL PI INDICATOR 7.5 (GLOVE) ×4
GLOVE ECLIPSE 7.0 STRL STRAW (GLOVE) ×3 IMPLANT
GLOVE EXAM NITRILE LRG STRL (GLOVE) IMPLANT
GLOVE EXAM NITRILE MD LF STRL (GLOVE) IMPLANT
GLOVE EXAM NITRILE XL STR (GLOVE) IMPLANT
GLOVE EXAM NITRILE XS STR PU (GLOVE) IMPLANT
GOWN STRL REUS W/ TWL LRG LVL3 (GOWN DISPOSABLE) ×2 IMPLANT
GOWN STRL REUS W/ TWL XL LVL3 (GOWN DISPOSABLE) IMPLANT
GOWN STRL REUS W/TWL 2XL LVL3 (GOWN DISPOSABLE) IMPLANT
GOWN STRL REUS W/TWL LRG LVL3 (GOWN DISPOSABLE) ×6
GOWN STRL REUS W/TWL XL LVL3 (GOWN DISPOSABLE)
HEMOSTAT POWDER SURGIFOAM 1G (HEMOSTASIS) ×2 IMPLANT
KIT BASIN OR (CUSTOM PROCEDURE TRAY) ×3 IMPLANT
KIT ROOM TURNOVER OR (KITS) ×3 IMPLANT
LEAD PERENNIAFLEX 2-3 304 (Neuro Prosthesis/Implant) ×2 IMPLANT
LIQUID BAND (GAUZE/BANDAGES/DRESSINGS) ×3 IMPLANT
LOOP VESSEL MAXI BLUE (MISCELLANEOUS) IMPLANT
LOOP VESSEL MINI RED (MISCELLANEOUS) IMPLANT
NDL HYPO 25X1 1.5 SAFETY (NEEDLE) ×1 IMPLANT
NEEDLE HYPO 25X1 1.5 SAFETY (NEEDLE) ×3 IMPLANT
NS IRRIG 1000ML POUR BTL (IV SOLUTION) ×3 IMPLANT
PACK LAMINECTOMY NEURO (CUSTOM PROCEDURE TRAY) ×3 IMPLANT
PAD ARMBOARD 7.5X6 YLW CONV (MISCELLANEOUS) ×9 IMPLANT
RUBBERBAND STERILE (MISCELLANEOUS) IMPLANT
SPONGE INTESTINAL PEANUT (DISPOSABLE) ×3 IMPLANT
SPONGE SURGIFOAM ABS GEL SZ50 (HEMOSTASIS) ×3 IMPLANT
STRIP CLOSURE SKIN 1/2X4 (GAUZE/BANDAGES/DRESSINGS) IMPLANT
SUT ETHILON 3 0 FSL (SUTURE) IMPLANT
SUT NURALON 4 0 TR CR/8 (SUTURE) ×2 IMPLANT
SUT SILK 2 0 (SUTURE)
SUT SILK 2-0 18XBRD TIE 12 (SUTURE) IMPLANT
SUT VIC AB 3-0 SH 8-18 (SUTURE) ×5 IMPLANT
SUT VICRYL 3-0 RB1 18 ABS (SUTURE) ×6 IMPLANT
SYR 20ML ECCENTRIC (SYRINGE) ×3 IMPLANT
TOWEL OR 17X24 6PK STRL BLUE (TOWEL DISPOSABLE) ×3 IMPLANT
TOWEL OR 17X26 10 PK STRL BLUE (TOWEL DISPOSABLE) ×3 IMPLANT
TUNNELING TOOL (MISCELLANEOUS) ×2 IMPLANT
WATER STERILE IRR 1000ML POUR (IV SOLUTION) ×3 IMPLANT

## 2014-07-04 NOTE — Op Note (Signed)
PREOP DIAGNOSIS: intractable epilepsy   POSTOP DIAGNOSIS: Same  PROCEDURE: 1. Placement of left Vagal nerve stimulator  SURGEON: Dr. Lisbeth RenshawNeelesh Madelin Weseman, MD  ASSISTANT: Dr. Tressie StalkerJeffrey Jenkins, MD  ANESTHESIA: General Endotracheal  EBL: 50cc  SPECIMENS: None  DRAINS: None  COMPLICATIONS: None immediate  CONDITION: Hemodynamically stable to PACU  HISTORY: Traci [name] is a 39 y.o. female with a history of medically intractable epilepsy who is referred for placement of vagal nerve stimulator by her neurologist. Risks and benefits of the surgery were explained in detail to the patient and her family.  PROCEDURE IN DETAIL: After informed consent was obtained and witnessed, the patient was brought to the operating room. After induction of general anesthesia, the patient was positioned on the operative table in the supine position. All pressure points were meticulously padded. Skin incision on the left neck, as well as then infraclavicular location was then marked out and prepped and draped in the usual sterile fashion.  After timeout was conducted, the infraclavicular incision was opened sharply after local anesthetic with epinephrine was injected. A subcutaneous taste pocket was then created, and it was packed with a gauze sponge. The left neck incision was then opened transversely, the platysma was identified and incised, and dissection was carried out until the medial border of the sternocleidomastoid muscle was identified. Utilizing natural fascial planes in the neck, dissection was carried out until the carotid sheath was identified and opened. The common carotid artery and jugular vein were then identified and dissected until the vagus nerve was identified in between. The vagus nerve was circumferentially dissected for a length of a proximally 3 cm.  A tunneler was then used to pass the vagus nerve leads from the neck incision to the infraclavicular pocket. The leads were then placed  on the vagus nerve sequentially. The generator was then connected, and was interrogated and found to have good connection with normal impedance. The generator was then placed in the subcutaneous taste pocket. Both wounds were then irrigated with copious amounts of normal saline irrigation. Hemostasis was achieved in the neck using accommodation of bipolar electrocautery and morcellized Gelfoam with thrombin. The pocket was then closed using interrupted 3-0 Vicryl stitches and a layer of Dermabond. The stimulator was interrogated again and found to be functioning normally. It was then confirmed to be off. The neck incision was then closed with 3-0 Vicryl to the platysma, and interrupted 3-0 Vicryl subcuticular stitches. Dermabond was then placed.  Sterile dressing was then applied. The patient was then transferred to the stretcher and taken to the postanesthesia care unit in stable hemodynamic condition.  At the end of the case all sponge, needle, cottonoid, and instrument counts were correct.

## 2014-07-04 NOTE — Anesthesia Preprocedure Evaluation (Signed)
Anesthesia Evaluation  Patient identified by MRN, date of birth, ID band Patient awake    Reviewed: Allergy & Precautions, NPO status , Patient's Chart, lab work & pertinent test results  Airway Mallampati: I       Dental   Pulmonary    Pulmonary exam normal       Cardiovascular Rhythm:Regular Rate:Normal     Neuro/Psych  Headaches, Seizures -,     GI/Hepatic   Endo/Other    Renal/GU Renal disease     Musculoskeletal   Abdominal   Peds  Hematology   Anesthesia Other Findings   Reproductive/Obstetrics                             Anesthesia Physical Anesthesia Plan  ASA: II  Anesthesia Plan: General   Post-op Pain Management:    Induction: Intravenous  Airway Management Planned: Oral ETT  Additional Equipment:   Intra-op Plan:   Post-operative Plan: Extubation in OR  Informed Consent: I have reviewed the patients History and Physical, chart, labs and discussed the procedure including the risks, benefits and alternatives for the proposed anesthesia with the patient or authorized representative who has indicated his/her understanding and acceptance.     Plan Discussed with: CRNA, Anesthesiologist and Surgeon  Anesthesia Plan Comments:         Anesthesia Quick Evaluation

## 2014-07-04 NOTE — Transfer of Care (Signed)
Immediate Anesthesia Transfer of Care Note  Patient: Traci [name]  Procedure(s) Performed: Procedure(s): VAGAL NERVE STIMULATOR IMPLANT (N/A)  Patient Location: PACU  Anesthesia Type:General  Level of Consciousness: awake, alert  and oriented  Airway & Oxygen Therapy: Patient Spontanous Breathing and Patient connected to nasal cannula oxygen  Post-op Assessment: Report given to RN and Post -op Vital signs reviewed and stable  Post vital signs: Reviewed and stable  Last Vitals:  Filed Vitals:   07/04/14 1637  BP: 128/76  Pulse: 107  Temp: 37.2 C  Resp: 13    Complications: No apparent anesthesia complications

## 2014-07-04 NOTE — Anesthesia Procedure Notes (Signed)
Procedure Name: Intubation Date/Time: 07/04/2014 2:18 PM Performed by: [place] Pink Pre-anesthesia Checklist: Patient identified, Emergency Drugs available, Suction available, Patient being monitored and Timeout performed Patient Re-evaluated:Patient Re-evaluated prior to inductionOxygen Delivery Method: Circle system utilized Preoxygenation: Pre-oxygenation with 100% oxygen Intubation Type: IV induction and Rapid sequence Laryngoscope Size: Mac and 3 Grade View: Grade I Tube type: Oral Tube size: 7.0 mm Number of attempts: 1 Airway Equipment and Method: Stylet and LTA kit utilized Placement Confirmation: ETT inserted through vocal cords under direct vision,  positive ETCO2 and breath sounds checked- equal and bilateral Secured at: 21 cm Tube secured with: Tape Dental Injury: Teeth and Oropharynx as per pre-operative assessment

## 2014-07-04 NOTE — Anesthesia Postprocedure Evaluation (Signed)
  Anesthesia Post-op Note  Patient: Traci [name]  Procedure(s) Performed: Procedure(s): VAGAL NERVE STIMULATOR IMPLANT (N/A)  Patient Location: PACU  Anesthesia Type:General  Level of Consciousness: awake, alert , oriented and patient cooperative  Airway and Oxygen Therapy: Patient Spontanous Breathing  Post-op Pain: mild  Post-op Assessment: Post-op Vital signs reviewed, Patient's Cardiovascular Status Stable, Respiratory Function Stable, Patent Airway, No signs of Nausea or vomiting and Pain level controlled  Post-op Vital Signs: stable  Last Vitals:  Filed Vitals:   07/04/14 1637  BP: 128/76  Pulse: 107  Temp: 37.2 C  Resp: 13    Complications: No apparent anesthesia complications

## 2014-07-04 NOTE — H&P (Signed)
CC:  Seizure  HPI: Traci [name] is a 39 y.o. female with medically intractable epilepsy. She continues to have approximately one generalized episode per week, as well as multiple staring spells. She therefore presents for placement of VNS.  PMH: Past Medical History  Diagnosis Date  . Seizures   . Headache   . History of kidney stones   . Interstitial cystitis     PSH: Past Surgical History  Procedure Laterality Date  . Knee surgery Right   . Cholecystectomy    . Appendectomy    . Cesarean section      2  . Laparoscopy    . Tubal ligation      SH: History  Substance Use Topics  . Smoking status: Never Smoker   . Smokeless tobacco: Not on file  . Alcohol Use: No    MEDS: Prior to Admission medications   Medication Sig Start Date End Date Taking? Authorizing Provider  butorphanol (STADOL) 10 MG/ML nasal spray Place 1 spray into the nose every 4 (four) hours as needed for headache.  05/07/14  Yes Historical Provider, MD  ibuprofen (ADVIL,MOTRIN) 200 MG tablet Take 200 mg by mouth every 6 (six) hours as needed for mild pain or moderate pain.   Yes Historical Provider, MD  lacosamide (VIMPAT) 200 MG TABS tablet Take 100-200 mg by mouth 2 (two) times daily. 1 in am and 1/2 in PM   Yes Historical Provider, MD  topiramate (TOPAMAX) 25 MG tablet Take 1 tablet (25 mg total) by mouth 2 (two) times daily. 05/14/14  Yes Carmen Dohmeier, MD  clonazePAM (KLONOPIN) 1 MG tablet Take 1 tablet (1 mg total) by mouth at bedtime. Patient not taking: Reported on 06/20/2014 05/14/14   Melvyn Novasarmen Dohmeier, MD  promethazine (PHENERGAN) 12.5 MG tablet Take 1 tablet (12.5 mg total) by mouth every 6 (six) hours as needed for nausea or vomiting. Patient not taking: Reported on 06/20/2014 05/08/14   Huston FoleySaima Athar, MD    ALLERGY: No Known Allergies  ROS: ROS  NEUROLOGIC EXAM: Awake, alert, oriented Memory and concentration grossly intact Speech fluent, appropriate CN grossly intact Motor  exam: Upper Extremities Deltoid Bicep Tricep Grip  Right 5/5 5/5 5/5 5/5  Left 5/5 5/5 5/5 5/5   Lower Extremity IP Quad PF DF EHL  Right 5/5 5/5 5/5 5/5 5/5  Left 5/5 5/5 5/5 5/5 5/5   Sensation grossly intact to LT  IMPRESSION: - 39 y.o. female with medically intractable epilepsy  PLAN: - Proceed with placement of left VNS - Likely home post-procedure  I have reviewed the details of the procedure, its risks, benefits, and alternatives to surgery with the patient and her family. After all questions were answered, informed consent was obtained.

## 2014-07-04 NOTE — Discharge Summary (Signed)
  Physician Discharge Summary  Patient ID: Traci [name] MRN: [id number] DOB/AGE: 37/06/1975 37 y.o.  Admit date: 07/04/2014 Discharge date: 07/04/2014  Admission Diagnoses: Intractable Epilepsy  Discharge Diagnoses: Same Active Problems:   * No active hospital problems. *   Discharged Condition: Stable  Hospital Course:  Traci [name] is a 39 y.o. female who underwent uncomplicated placement of a vagal nerve stimulator. She was seen in recovery and was at her baseline.  Treatments: Surgery - Left VNS placement  Discharge Exam: Blood pressure 128/76, pulse 107, temperature 99 F (37.2 C), temperature source Oral, resp. rate 13, height 5\' 2"  (1.575 m), weight 107.321 kg (236 lb 9.6 oz), SpO2 100 %. Awake, alert, oriented Speech fluent, appropriate CN grossly intact 5/5 BUE/BLE Wound c/d/i Neck soft  Disposition: 01-Home or Self Care     Medication List    STOP taking these medications        clonazePAM 1 MG tablet  Commonly known as:  KLONOPIN     promethazine 12.5 MG tablet  Commonly known as:  PHENERGAN      TAKE these medications        butorphanol 10 MG/ML nasal spray  Commonly known as:  STADOL  Place 1 spray into the nose every 4 (four) hours as needed for headache.     ibuprofen 200 MG tablet  Commonly known as:  ADVIL,MOTRIN  Take 200 mg by mouth every 6 (six) hours as needed for mild pain or moderate pain.     lacosamide 200 MG Tabs tablet  Commonly known as:  VIMPAT  Take 100-200 mg by mouth 2 (two) times daily. 1 in am and 1/2 in PM     oxyCODONE-acetaminophen 10-325 MG per tablet  Commonly known as:  PERCOCET  Take 1 tablet by mouth every 4 (four) hours as needed for pain.     topiramate 25 MG tablet  Commonly known as:  TOPAMAX  Take 1 tablet (25 mg total) by mouth 2 (two) times daily.       Follow-up Information    [facility] Harper Smoker, C, MD In 2 weeks.   Specialty:  Neurosurgery   Contact information:   [address] [place] [address] [phone]       Signed: Lisbeth RenshawUNDKUMAR, Edsel Shives, Salena SanerC 07/04/2014, 5:05 PM

## 2014-07-07 ENCOUNTER — Encounter (HOSPITAL_COMMUNITY): Payer: Self-pay | Admitting: Neurosurgery

## 2014-07-08 ENCOUNTER — Encounter: Payer: Self-pay | Admitting: Neurology

## 2014-07-08 ENCOUNTER — Ambulatory Visit (INDEPENDENT_AMBULATORY_CARE_PROVIDER_SITE_OTHER): Payer: BLUE CROSS/BLUE SHIELD | Admitting: Neurology

## 2014-07-08 ENCOUNTER — Telehealth: Payer: Self-pay | Admitting: Neurology

## 2014-07-08 VITALS — BP 120/80 | HR 88 | Temp 97.4°F | Resp 18 | Ht 62.21 in | Wt 243.0 lb

## 2014-07-08 DIAGNOSIS — G40409 Other generalized epilepsy and epileptic syndromes, not intractable, without status epilepticus: Secondary | ICD-10-CM | POA: Diagnosis not present

## 2014-07-08 DIAGNOSIS — G40B19 Juvenile myoclonic epilepsy, intractable, without status epilepticus: Secondary | ICD-10-CM

## 2014-07-08 DIAGNOSIS — Z9689 Presence of other specified functional implants: Secondary | ICD-10-CM | POA: Insufficient documentation

## 2014-07-08 DIAGNOSIS — Z462 Encounter for fitting and adjustment of other devices related to nervous system and special senses: Secondary | ICD-10-CM | POA: Insufficient documentation

## 2014-07-08 NOTE — Progress Notes (Signed)
Provider:  Melvyn Novas, M D  Referring Provider: Heywood Bene, MD Primary Care Physician:  Traci Maine, MD    HPI:  Traci [name] is a 39 y.o. female  Is seen here as a referral  from Dr. Lucila [name]  and Dr Traci [name] for a second opinion consult about epilepsy,  Traci [name] has been diagnosed with seizures since age 60. Her very first seizure happened while her mother was present at home she had been painting her nails and her mother was concerned that she may have inhaled or drunk  some of the organic solvent.She is now 39 years old right-handed Caucasian female with an underlying medical history of kidney stones migraines and a recent concern for pelvic infection treated with Rocephin, azithromycin and Flagyl. His have always been primary generalized according to her description and she was originally treated with topiramate for 11 years she had about one seizure every 3 months which was a good control. She was also treated with Depakote as a teenager which she had a lot of concerns regarding him tablet echogenicity, and was then switched to Lamictal. She had also gained quite a bit of weight on Depakote. Treated on she is now on Vimpat. Her seizures are described as all over twitching or jerking movements sometimes she will create an abnormal sound sometimes she will twitching her sleep. Her husband has observed clicking sounds and twitching movements of the hand and feet while asleep. She is usually not responsive. She has had bruising unexplained in the morning she has bitten her tongue during sleep. Her husband videotaped some of these activities and I can also see that she is opening her eyes slightly and has rolling eye movements left-to-right before she begins clicking and her left hand is twitching. Yesterday a 32 minute EEG was performed, which I interpreted. This 32 minutes the patient had 15 spells of 3 Hz spike and wave discharges,  but none lasting longer than 2 seconds,   most of them lasting just 1 second. They did not generalize from there on.  They appeared to be high amplitude over the left hemisphere than right. They appeared more central temporal and posterior to me. However within a fraction of a second these  Generalized. she was successfully treated with topiramate her seizures were generalized without any aura come on and would become tonic clonic seizures. She was unresponsive and they were followed by a postictal confusion.Over the last couple of months her seizures have changed. She can almost tell now when one is coming on. She will be staring off, she will not have verbal output, and she will have jerking over the upper extremities. When she is working on her phone for example she may throw it up in the air in this jerking movement. She threw a bowel of  cereal on the kitchen floor. Not worse in the morning and that for example have not occurred when she brushes her teeth or is getting dressed. She has had some after her morning shower and it seems that when she gets hot this may be a trigger factor for her.Stress and sleep deprivation are identified as her main triggers and there is a possible menstrual cycle trigger as well. However photic stimulation is very uncomfortable to her and she cannot look into bright sunlight or feels somewhat queasy when driving and seeing the trees passing by - creating a strobe light effect. 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 While many patients grew out of it,  some do not ;as is  [name]'s case .  In Dr. Teofilo PodAthar's visit some of the medications were changed and she is now currently taking Vimpat 200 mg 2 times a day, she has Ativan for PRN use,  she stopped taking Cymbalta as well as Zanaflex.  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 Worsening since may 2015, and worsened again after  a pelvic infection and the treatment with antibiotics. She described absence seizures, staring off w as well as myoclonic jerks.    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   Treatment plan and additional workup : Vimpat 200 mg bid continued. Follow liver enzyme. Can not add TPM due to renal stones. I still think this is her best choice for add on.  Klonopin. Will check CMET, CBC in 6 weeks again with RV .   07-08-14, Interval history since [name] done taking Vimpat andlater still resumed topiramate at a low dose ( renal stones , will follow closely )she has had some improvement in her seizure activity. However she continued to pursue the VNS stimulator as many medications have failed her in the past. And she would be considered intractable epilepsy. She had the VNS stimulator implanted just on May 6- 4 days ago,  and I can see her fresh scar which has healed pretty well and scabbed on the left chest as well as on the left neck. The VNS stimulator is to be activated today. She states that she has done already better with the medication changes. Her hope was that she may require less medication in the future and a better seizure control overall. The degree of myoclonic epileptic activity has [name]'s from being able to work, drive, and function in her social life as well. She is currently quite hoarse but this is expected after vagal nerve stimulation.  The VNS therapy is to be initiated today 4 days after implantation. Updated parameters for today aren't output current study of 0.250 mA Signal frequency of 20 Hz Pulse width of 12 and 50 s Signal on time 30 seconds, signal off time 5 minutes. Hematocrit parameters will be output current of 0.500 mA, Pulse width of 250 s Signal on time 60 seconds  The Auto stimulation pattern  meters which is different from the magna baseline per her meters are as follows output current will be all 0.375 mA Pulse width here to a 50 s again, signal on time 60 seconds again.  AUTO stimulation is to occur, when her heart rate exceeds 40% of the baseline heart rate.  System diagnostic data from 07-08-14 at 8 hours 43 minutes a.m. impedance value is 1899 ohm lead impedance is okay complication okay output current status was okay. Current delivered or 0.25o mili ampere . The patient is instructed in magnet use , daily swiping recommended.   Review of Systems: Out of a complete 14 system review, the patient complains of only the following symptoms, and all other reviewed systems are negative. JME and migraine headaches.   Frequent morning headaches and they get worse as the day goes on.  Photophobia.  Nausea.   History   Social History  . Marital Status: Married    Spouse Name: Traci [name]  .  Number of Children: 2  . Years of Education: Some colle   Occupational History  . Not on file.   Social History Main Topics  . Smoking status: Never Smoker   . Smokeless tobacco: Not on file  . Alcohol Use: No  . Drug Use: No  . Sexual Activity: Not on file   Other Topics Concern  . Not on file   Social History Narrative   Daily caffeine consumption- 1 cup    Family History  Problem Relation Age of Onset  . Cancer Mother   . Diabetes Father   . Cancer Maternal Grandmother   . Cancer Maternal Grandfather   . Diabetes Paternal Grandmother   . Diabetes Paternal Grandfather     Past Medical History  Diagnosis Date  . Seizures   . Headache   . History of kidney stones   . Interstitial cystitis     Past Surgical History  Procedure Laterality Date  . Knee surgery Right   . Cholecystectomy    . Appendectomy    . Cesarean section      2  . Laparoscopy    . Tubal ligation    . Vagus nerve stimulator insertion N/A 07/04/2014    Procedure: VAGAL NERVE STIMULATOR IMPLANT;   Surgeon: Lisbeth RenshawNeelesh Nundkumar, MD;  Location: [facility];  Service: Neurosurgery;  Laterality: N/A;    Current Outpatient Prescriptions  Medication Sig Dispense Refill  . butorphanol (STADOL) 10 MG/ML nasal spray Place 1 spray into the nose every 4 (four) hours as needed for headache.   4  . ibuprofen (ADVIL,MOTRIN) 200 MG tablet Take 200 mg by mouth every 6 (six) hours as needed for mild pain or moderate pain.    Marland Kitchen. lacosamide (VIMPAT) 200 MG TABS tablet Take 100-200 mg by mouth 2 (two) times daily. 1 in am and 1/2 in PM    . oxyCODONE-acetaminophen (PERCOCET) 10-325 MG per tablet Take 1 tablet by mouth every 4 (four) hours as needed for pain. 30 tablet 0  . topiramate (TOPAMAX) 25 MG tablet Take 1 tablet (25 mg total) by mouth 2 (two) times daily. 120 tablet 3   No current facility-administered medications for this visit.    Allergies as of 07/08/2014  . (No Known Allergies)    Vitals: BP 120/80 mmHg  Pulse 88  Temp(Src) 97.4 F (36.3 C) (Oral)  Resp 18  Ht 5' 2.21" (1.58 m)  Wt 243 lb (110.224 kg)  BMI 44.15 kg/m2  LMP 05/30/2014 Last Weight:  Wt Readings from Last 1 Encounters:  07/08/14 243 lb (110.224 kg)   Last Height:   Ht Readings from Last 1 Encounters:  07/08/14 5' 2.21" (1.58 m)    Physical exam:  General: The patient is awake, alert and appears not in acute distress. The patient is well groomed. Head: Normocephalic, atraumatic. Neck is supple. Mallampati 3 , neck circumference: 14.5  Cardiovascular:  Regular rate and rhythm* without  murmurs or carotid bruit, and without distended neck veins. The neck scar is scabbed, about 3 inches long,  The chest scar is 2 inches long  And scabbed as well  Respiratory: Lungs are clear to auscultation. Skin:  Without evidence of edema, or rash Trunk: BMI is  elevated and patient  has normal posture.  Neurologic exam : The patient is awake and alert, oriented to place and time.  Memory subjective described as intact. Amnesia  for her recent hospital visit, pain medication induced. There is a normal attention span & concentration  ability. Speech is fluent without  Dysarthria, but she has notable  dysphonia . Mood and affect are apprehensive . Cranial nerves: Pupils are equal and briskly reactive to light. Funduscopic exam without evidence of pallor or edema.  Extraocular movements  in vertical and horizontal planes intact and without nystagmus.  Visual fields by finger perimetry are intact. Hearing to finger rub intact.  Facial sensation intact to fine touch.  Facial motor strength is symmetric and tongue and uvula move midline.  Tongue protrusion into either cheek is normal. Shoulder shrug is normal.  Motor exam:  Normal tone, muscle bulk and symmetric  strength in all extremities. Sensory:  Fine touch, pinprick and vibration were tested in all extremities.  Proprioception was normal. Coordination: Rapid alternating movements in the fingers/hands were normal. Finger-to-nose maneuver  normal without evidence of ataxia, dysmetria or tremor. Gait and station: Patient walks without assistive device ,able  to climb unassisted up to the exam table.  Strength within normal limits. Stance is stable and normal. Tandem gait is unfragmented. Romberg testing is negative Deep tendon reflexes: in the upper and lower extremities are symmetric and intact. Babinski maneuver response is downgoing.   Assessment the patient has a diagnosis of juvenile myoclonic epilepsy but also by description of her spouse and others and has absence Seizures. She has frequent migraines.  Plan the VNS stimulator is supposed to reduce the number of epileptic seizures in daytime as well as at nighttime given its AutoSet setting sensitive to the patient's heart rate. We hope that we also see a reduction in her migraines which are suspected to relate at least sometimes 2 seizures in her case and some of the seizures may clinically be undetectable. Current  seizure frequency is 2 a week.   Porfirio Mylar Jaisa Defino MD 07/08/2014   Cc Dr Huston Foley , Dr Traci [name].

## 2014-07-08 NOTE — Telephone Encounter (Signed)
The patient received a VNS stimulator model #106 serial #[id number] implanted on 07/04/2014. Patient's date of birth is 09/07/1975. The lead serial number is [id number] electrode ID is 2 mm model 304.

## 2014-07-09 ENCOUNTER — Ambulatory Visit: Payer: BLUE CROSS/BLUE SHIELD | Admitting: Neurology

## 2014-07-09 ENCOUNTER — Telehealth: Payer: Self-pay

## 2014-07-09 NOTE — Telephone Encounter (Signed)
[name] indicated the patient is specifically requesting Dr Dohmeier begin prescribing Stadol for her.  Would you like to prescribe?  Please advise.  Thank you.

## 2014-07-10 NOTE — Telephone Encounter (Signed)
No, I am happy to treat epilepsy, but she got her stadol from another prescriber , I wold not like to  Take narcotics (STADOL) over .

## 2014-07-10 NOTE — Telephone Encounter (Signed)
I called the pharmacy.  Spoke with [employer].  Relayed providers message.  She verbalized understanding and will contact the patient regarding another provider.

## 2014-08-04 DIAGNOSIS — Z0289 Encounter for other administrative examinations: Secondary | ICD-10-CM

## 2014-08-26 ENCOUNTER — Telehealth: Payer: Self-pay | Admitting: Neurology

## 2014-08-26 NOTE — Telephone Encounter (Signed)
Per last note from Dr Dohmeier:   Melvyn Novasarmen Dohmeier, MD at 07/10/2014 5:06 PM     Status: Signed       Expand All Collapse All   No, I am happy to treat epilepsy, but she got her stadol from another prescriber , I wold not like to Take narcotics (STADOL) over .       I called back and relayed this info.  They verbalized understanding and will note file.

## 2014-08-26 NOTE — Telephone Encounter (Signed)
Traci [name] with [facility] is requesting refill for butorphanol (STADOL) 10 MG/ML nasal spray . He can be reached at [phone] and fax is [phone].

## 2014-08-27 ENCOUNTER — Telehealth: Payer: Self-pay | Admitting: Neurology

## 2014-08-27 ENCOUNTER — Other Ambulatory Visit: Payer: Self-pay | Admitting: Neurology

## 2014-08-27 NOTE — Telephone Encounter (Signed)
Patient's husband is calling to get a [facility] [place] [facility] the [place]. The patient has had a headache for over a week and the headache is affecting her vision. Please call and advise.

## 2014-08-27 NOTE — Telephone Encounter (Signed)
I won't give her butorphenol. She had it prescribed in April with 4 refills and she has refilled them early and is out of refills. She is using it too often.  I told her we would call her in the morning and see if we could do a migraine cocktail.  Kara Meadmma - can you talk to Inetta Fermoina and call patient back in the morning please if we can provide a migraine cocktail? thanks

## 2014-08-27 NOTE — Telephone Encounter (Signed)
I called back.  Spoke with spouse.  Says patient has had a migraine for about 1 week, and now her vision is starting to become blurry.  She has tried Excedrin Migraine and Aleve with no benefit.  (Per previous note from Dr Vickey Hugerohmeier on 05/11, she is happy to treat epilepsy, but did not wish to prescribe Stadol, which patient had requested.)  I asked if they had consulted PCP, and Mr Katrinka BlazingSmith said they had not, and did not wish to pursue that route.  [facility].  Please advise.  Thank you.

## 2014-08-28 NOTE — Telephone Encounter (Signed)
Spoke with pt to let her know she can come in now for a migraine cocktail. She said she needs to get her kids ready and either her husband will bring her or her friend Tresa EndoKelly. She cannot drive because she lost her license d/t having epilepsy.

## 2014-08-29 ENCOUNTER — Telehealth: Payer: Self-pay | Admitting: Neurology

## 2014-08-29 MED ORDER — CHLORPROMAZINE HCL 100 MG PO TABS: 100.0000 mg | ORAL_TABLET | Freq: Two times a day (BID) | ORAL | Status: DC

## 2014-08-29 NOTE — Telephone Encounter (Signed)
I called the patient. The patient has had headaches over the last 7 or 8 days. She came in for a migraine cocktail infusion yesterday, with transient benefit. The headache is back today. She indicates in the past, prednisone has not been beneficial in knocking out her headache. I'll give her a 3 day trial on Thorazine to see if this helps.

## 2014-09-02 NOTE — Telephone Encounter (Signed)
I called the patient. She got no benefit with the thorazine. She wants the stadol NS, original Rx written by another MD. I will let Dr. Vickey Hugerohmeier decide on whether or not to give the Rx.

## 2014-09-02 NOTE — Telephone Encounter (Signed)
Patient's husband Alycia RossettiRyan is calling to let us know that the patient cannot get relief from her migraine. The on-call Dr prescribed a medication Thorazine 100 mg that knocked her out but did not get rid of the pain. She quit taking the medication. Please call Alycia RossettiRyan [phone] with advice as to what to do next.  Thanks

## 2014-09-03 ENCOUNTER — Encounter: Payer: Self-pay | Admitting: Neurology

## 2014-09-03 ENCOUNTER — Telehealth: Payer: Self-pay

## 2014-09-03 ENCOUNTER — Ambulatory Visit (INDEPENDENT_AMBULATORY_CARE_PROVIDER_SITE_OTHER): Payer: BLUE CROSS/BLUE SHIELD | Admitting: Neurology

## 2014-09-03 VITALS — BP 130/84 | HR 82 | Resp 16 | Ht 62.21 in | Wt 242.0 lb

## 2014-09-03 DIAGNOSIS — G40B19 Juvenile myoclonic epilepsy, intractable, without status epilepticus: Secondary | ICD-10-CM | POA: Diagnosis not present

## 2014-09-03 DIAGNOSIS — G4489 Other headache syndrome: Secondary | ICD-10-CM | POA: Diagnosis not present

## 2014-09-03 DIAGNOSIS — M542 Cervicalgia: Secondary | ICD-10-CM | POA: Diagnosis not present

## 2014-09-03 MED ORDER — PROMETHAZINE HCL 12.5 MG PO TABS: 12.5000 mg | ORAL_TABLET | Freq: Three times a day (TID) | ORAL | Status: DC | PRN

## 2014-09-03 NOTE — Telephone Encounter (Signed)
Dr. Epimenio FootSater saw pt in the office today. See note.

## 2014-09-03 NOTE — Telephone Encounter (Signed)
I called pt to check on her migraine. Pt says her migraine pain level went down to a 4-5 after the migraine cocktail provided by Dr. Lucia GaskinsAhern last week but the migraine came back. She is requesting stadol NS, but per Dr. Oliva Bustardohmeier's note on 07/10/14, she does not want to prescribe stadol. I recommended that the pt come in to be seen for this migraine. The WID today is Dr. Epimenio FootSater and I spoke to his nurse who was able to work the patient in at 2:40. The pt was agreeable to coming in and verbalized understanding to be here by 2:40 to see Dr. Epimenio FootSater. Pt was appreciative.

## 2014-09-03 NOTE — Progress Notes (Signed)
[facility]  PATIENT: Traci [name] DOB: 12/12/1975     HISTORICAL  CHIEF COMPLAINT:  Chief Complaint  Patient presents with  . Headache    Dr. Vickey Huger pt. seen for epilepsy and headaches.  Dr. Vickey Huger is out of the office today, so Dr. Epimenio Foot will see her for continued h/a.  She reports onset of  h/a for last 3 weeks.  Sts. she has taken Advil, Excedrin Migraine, had iv migraine coctail in our office last week.  Sts. her last neurologist (Dr. Arville Go) gave her Stadol nasal spray and this helped, but Dr. Vickey Huger has been clear with her that she will not write this rx./fim    HISTORY OF PRESENT ILLNESS:  HA:   She reports a chronic HA x 10 days in the right temporal region and the right occiput.   She notes nausea, and vomiting, photophobia and phonophobia.   Moving head does not alter the pain much.    Last week, she got IV medications with some benefit x a few hours and then pain returned to same level.     She is on TPM for seizures at 50 mg po bid  Seizures:    She has JME that was only partially responsive to medications.   VNS has helped some more - placed 2 months ago.   She is on Vimpat and Topiramate.   She had one seizure late last week.    ROS:  Out of a complete 14 system review of symptoms, the patient complains only of the following symptoms, and all other reviewed systems are negative.  Headache, neck pain, nausea   ALLERGIES: No Known Allergies  HOME MEDICATIONS:  Current outpatient prescriptions:  .  ibuprofen (ADVIL,MOTRIN) 200 MG tablet, Take 200 mg by mouth every 6 (six) hours as needed for mild pain or moderate pain., Disp: , Rfl:  .  lacosamide (VIMPAT) 200 MG TABS tablet, Take 100-200 mg by mouth 2 (two) times daily. 1 in am and 1/2 in PM, Disp: , Rfl:  .  topiramate (TOPAMAX) 25 MG tablet, Take 1 tablet (25 mg total) by mouth 2 (two) times daily., Disp: 120 tablet, Rfl: 3 .  butorphanol (STADOL) 10 MG/ML nasal spray, Place  1 spray into the nose every 4 (four) hours as needed for headache. , Disp: , Rfl: 4 .  chlorproMAZINE (THORAZINE) 100 MG tablet, Take 1 tablet (100 mg total) by mouth 2 (two) times daily. (Patient not taking: Reported on 09/03/2014), Disp: 6 tablet, Rfl: 0  PAST MEDICAL HISTORY: Past Medical History  Diagnosis Date  . Seizures   . Headache   . History of kidney stones   . Interstitial cystitis     PAST SURGICAL HISTORY: Past Surgical History  Procedure Laterality Date  . Knee surgery Right   . Cholecystectomy    . Appendectomy    . Cesarean section      2  . Laparoscopy    . Tubal ligation    . Vagus nerve stimulator insertion N/A 07/04/2014    Procedure: VAGAL NERVE STIMULATOR IMPLANT;  Surgeon: Lisbeth Renshaw, MD;  Location: [facility];  Service: Neurosurgery;  Laterality: N/A;    FAMILY HISTORY: Family History  Problem Relation Age of Onset  . Cancer Mother   . Diabetes Father   . Cancer Maternal Grandmother   . Cancer Maternal Grandfather   . Diabetes Paternal Grandmother   . Diabetes Paternal Grandfather     SOCIAL HISTORY:  History  Social History  . Marital Status: Married    Spouse Name: [name]  . Number of Children: 2  . Years of Education: Some colle   Occupational History  . Not on file.   Social History Main Topics  . Smoking status: Never Smoker   . Smokeless tobacco: Not on file  . Alcohol Use: No  . Drug Use: No  . Sexual Activity: Not on file   Other Topics Concern  . Not on file   Social History Narrative   Daily caffeine consumption- 1 cup     PHYSICAL EXAM  Filed Vitals:   09/03/14 1450  BP: 130/84  Pulse: 82  Resp: 16  Height: 5' 2.21" (1.58 m)  Weight: 242 lb (109.77 kg)    Body mass index is 43.97 kg/(m^2).   General: The patient is well-developed and well-nourished and in no acute distress  Eyes:  Funduscopic exam shows normal optic discs and retinal vessels.  Neck: The neck is very tender over the right  splenius capitis muscle / occipital nerve.      Neurologic Exam  Mental status: The patient is alert and oriented x 3 at the time of the examination. The patient has apparent normal recent and remote memory, with an apparently normal attention span and concentration ability.   Speech is normal.  Cranial nerves: Extraocular movements are full. Pupils are equal, round, and reactive to light and accomodation.    Facial symmetry is present. There is good facial sensation to soft touch bilaterally.Facial strength is normal.  Trapezius and sternocleidomastoid strength is normal. No dysarthria is noted.  The tongue is midline, and the patient has symmetric elevation of the soft palate. No obvious hearing deficits are noted.  Motor:  Muscle bulk is normal.   Tone is normal. Strength is  5 / 5 in all 4 extremities.   Sensory: Sensory testing is intact to  Touch  in all 4 extremities.  Coordination: Cerebellar testing reveals good finger-nose-finger.  Gait and station: Station is normal.   Gait is normal. Tandem gait is fairly normal. Romberg is negative.   Reflexes: Deep tendon reflexes are symmetric and normal bilaterally.     DIAGNOSTIC DATA (LABS, IMAGING, TESTING) - I reviewed patient records, labs, notes, testing and imaging myself where available.  Lab Results  Component Value Date   WBC 6.2 06/26/2014   HGB 14.6 06/26/2014   HCT 42.6 06/26/2014   MCV 88.8 06/26/2014   PLT 323 06/26/2014      Component Value Date/Time   NA 138 06/26/2014 0842   NA 140 05/08/2014 1029   K 3.9 06/26/2014 0842   CL 107 06/26/2014 0842   CO2 22 06/26/2014 0842   GLUCOSE 100* 06/26/2014 0842   GLUCOSE 111* 05/08/2014 1029   BUN 9 06/26/2014 0842   BUN 16 05/08/2014 1029   CREATININE 0.87 06/26/2014 0842   CALCIUM 9.8 06/26/2014 0842   PROT 7.4 05/08/2014 1029   PROT 5.8* 09/26/2007 1830   ALBUMIN 2.8* 09/26/2007 1830   AST 20 05/08/2014 1029   ALT 35* 05/08/2014 1029   ALKPHOS 64 05/08/2014  1029   BILITOT 0.4 05/08/2014 1029   BILITOT 0.3 09/26/2007 1830   GFRNONAA 84* 06/26/2014 0842   GFRAA >90 06/26/2014 0842    Lab Results  Component Value Date   TSH 1.260 05/08/2014       ASSESSMENT AND PLAN  Other headache syndrome  Juvenile myoclonic epilepsy, intractable, without status epilepticus  Neck pain   1.  Right splenius capitis trigger point injection with 80 mg Depo-Medrol in 3 mL Marcaine. She tolerated the procedure well. 2.  Patient did not improve much after the injection and she will receive 60 mg IM Toradol. 3.  She will follow-up with Dr. Vickey Hugerohmeier.   Calen Posch A. Epimenio FootSater, MD, PhD 09/03/2014, 3:04 PM Certified in Neurology, Clinical Neurophysiology, Sleep Medicine, Pain Medicine and Neuroimaging  [facility] [address], [address] [place], [place] [address] [phone]

## 2014-10-06 ENCOUNTER — Telehealth: Payer: Self-pay

## 2014-10-06 NOTE — Telephone Encounter (Signed)
Spoke to husband (per DPR, cell [phone]) and advised him that the VNS rep is unable to make pt's appt on Wednesday 8/10 and asked if we could move it to 8/9 at 1:30. Pt's husband stated that we could do that, and he verbalized understanding that her appt will be changed to Tuesday, 8/9 at 1:30.

## 2014-10-07 ENCOUNTER — Telehealth: Payer: Self-pay | Admitting: Neurology

## 2014-10-07 ENCOUNTER — Encounter: Payer: Self-pay | Admitting: Neurology

## 2014-10-07 ENCOUNTER — Ambulatory Visit (INDEPENDENT_AMBULATORY_CARE_PROVIDER_SITE_OTHER): Payer: BLUE CROSS/BLUE SHIELD | Admitting: Neurology

## 2014-10-07 VITALS — BP 124/90 | HR 94 | Resp 20 | Ht 62.0 in | Wt 237.0 lb

## 2014-10-07 DIAGNOSIS — G40409 Other generalized epilepsy and epileptic syndromes, not intractable, without status epilepticus: Secondary | ICD-10-CM

## 2014-10-07 DIAGNOSIS — G40B19 Juvenile myoclonic epilepsy, intractable, without status epilepticus: Secondary | ICD-10-CM | POA: Insufficient documentation

## 2014-10-07 MED ORDER — TOPIRAMATE 25 MG PO TABS: ORAL_TABLET | ORAL | Status: DC

## 2014-10-07 MED ORDER — ZOLPIDEM TARTRATE ER 12.5 MG PO TBCR: 12.5000 mg | EXTENDED_RELEASE_TABLET | Freq: Every evening | ORAL | Status: DC | PRN

## 2014-10-07 MED ORDER — PROMETHAZINE HCL 12.5 MG PO TABS: 12.5000 mg | ORAL_TABLET | Freq: Three times a day (TID) | ORAL | Status: DC | PRN

## 2014-10-07 MED ORDER — LACOSAMIDE 200 MG PO TABS: 100.0000 mg | ORAL_TABLET | Freq: Two times a day (BID) | ORAL | Status: DC

## 2014-10-07 NOTE — Telephone Encounter (Signed)
Husband called and said that Dr Vickey Huger prescribed Phenergan and Ambien at visit today. Pharmacy got the request for Phenergan but not the Ambien. [employer], Copywriter, advertising (pharmacy closes at [facility] today)

## 2014-10-07 NOTE — Telephone Encounter (Signed)
This Rx was sent.  I called the pharmacy.  Spoke with pharmacist who said they did get the Rx, but when the patient called, they had not gotten a chance to enter it yet.  He confirmed they are filling it now, and they will contact the patient as soon as it's ready for pick up.

## 2014-10-07 NOTE — Progress Notes (Signed)
Provider:  Melvyn Novas, M D  Referring Provider: Heywood Bene, MD Primary Care Physician:  Lucila Maine, MD    HPI:  Traci [name] is a 39 y.o. female   was seen by Dr Vickey Huger  as a referral  from Dr. Lucila Maine  and Dr Frances Furbish for a second opinion consult about epilepsy,  Traci [name] has been diagnosed with seizures since age 36. Her very first seizure happened while her mother was present at home she had been painting her nails and her mother was concerned that she may have inhaled or drunk  some of the organic solvent.She is now 39 years old right-handed Caucasian female with an underlying medical history of kidney stones migraines and a recent concern for pelvic infection treated with Rocephin, azithromycin and Flagyl. His have always been primary generalized according to her description and she was originally treated with topiramate for 11 years she had about one seizure every 3 months which was a good control. She was also treated with Depakote as a teenager which she had a lot of concerns regarding him tablet echogenicity, and was then switched to Lamictal. She had also gained quite a bit of weight on Depakote. Treated on she is now on Vimpat. Her seizures are described as all over twitching or jerking movements sometimes she will create an abnormal sound sometimes she will twitching her sleep. Her husband has observed clicking sounds and twitching movements of the hand and feet while asleep. She is usually not responsive. She has had bruising unexplained in the morning she has bitten her tongue during sleep. Her husband videotaped some of these activities and I can also see that she is opening her eyes slightly and has rolling eye movements left-to-right before she begins clicking and her left hand is twitching. Yesterday a 32 minute EEG was performed, which I interpreted. This 32 minutes the patient had 15 spells of 3 Hz spike and wave discharges,  but none lasting longer  than 2 seconds,  most of them lasting just 1 second. They did not generalize from there on.  They appeared to be high amplitude over the left hemisphere than right. They appeared more central temporal and posterior to me. However within a fraction of a second these  Generalized. she was successfully treated with topiramate her seizures were generalized without any aura come on and would become tonic clonic seizures. She was unresponsive and they were followed by a postictal confusion.Over the last couple of months her seizures have changed. She can almost tell now when one is coming on. She will be staring off, she will not have verbal output, and she will have jerking over the upper extremities. When she is working on her phone for example she may throw it up in the air in this jerking movement. She threw a bowel of  cereal on the kitchen floor. Not worse in the morning and that for example have not occurred when she brushes her teeth or is getting dressed. She has had some after her morning shower and it seems that when she gets hot this may be a trigger factor for her.Stress and sleep deprivation are identified as her main triggers and there is a possible menstrual cycle trigger as well. However photic stimulation is very uncomfortable to her and she cannot look into bright sunlight or feels somewhat queasy when driving and seeing the trees passing by - creating a strobe light effect. 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 While many patients grew out of it,  some do not ;as is  Traci [name]'s case .  In Dr. Teofilo Pod visit some of the medications were changed and she is now currently taking Vimpat 200 mg 2 times a day, she has Ativan for PRN use,  she stopped taking Cymbalta as well as Zanaflex.  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 Worsening since may 2015, and worsened again after  a pelvic infection and the treatment with antibiotics. She described absence seizures, staring off w as well as myoclonic jerks.    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   Treatment plan and additional workup : Vimpat 200 mg bid continued. Follow liver enzyme. Can not add TPM due to renal stones. I still think this is her best choice for add on.  Klonopin. Will check CMET, CBC in 6 weeks again with RV .    VNS implantation.  07-08-14, Interval history since Traci [name]  Vimpat and later  topiramate at a low dose ( renal stones , will follow closely )she has had some improvement in her seizure activity. However she continued to pursue the VNS stimulator as many medications have failed her in the past. And she would be considered intractable epilepsy. She had the VNS stimulator implanted just on May 6- 4 days ago,  and I can see her fresh scar which has healed pretty well and scabbed on the left chest as well as on the left neck. The VNS stimulator is to be activated today. She states that she has done already better with the medication changes. Her hope was that she may require less medication in the future and a better seizure control overall. The degree of myoclonic epileptic activity has Traci [name] from being able to work, drive, and function in her social life as well. She is currently quite hoarse but this is expected after vagal nerve stimulation.The VNS therapy is to be initiated today 4 days after implantation. Updated parameters for today aren't output current study of 0.250 mA Signal frequency of 20 Hz Pulse width of 12 and 50 s Signal on time 30 seconds, signal off time 5 minutes.  parameters will be output current of 0.500 mA, Pulse width of 250 s Signal on time 60 seconds The Auto stimulation pattern  meters which is different from the magna baseline per her meters are as follows output current will be all 0.375 mA Pulse width here to a 50 s again, signal on time 60 seconds again.  AUTO stimulation is to occur, when her heart rate exceeds 40% of the baseline heart rate. System diagnostic data from 07-08-14 at 8 hours 43 minutes a.m. impedance value is 1899 ohm lead impedance is okay complication okay output current status was okay. Current delivered or 0.25o mili ampere . The patient is instructed in magnet use , daily swiping recommended.   10-07-14  Implant date 07-04-14 MODEL 106.  Last visit on 09-06-14 with Dr. Epimenio Foot to address a headache complaint. I followed the patient for epilepsy juvenile myoclonic seizures. Trigger point injections only gave relief about 1 or 2 days. We're investigating today the VNS stimulator setting the patient had reported at least 5 seizures over the last 3 weeks. She also  reports that she sleeps poorly.  Output current in milliampere 0.25, signal frequency and hertz 20, pulse width and microseconds 250, signal on time 30 seconds signal off time 5 minutes  Magnet setting current output or 0.5 mA pulse width in microseconds 250 signal on time 60 seconds, auto stimulation setting output current milliamperes 0.375, pulse width 250 s signal on time 60 seconds. Tachycardia detection is on. Threshold for auto stimulation as a increase in heart rate by 40%. 293 magnet swipes, 51 autostimulation per day,   We will now increase the output current to 0.5 mA magnet to 0.75 mA output current and outer stimulation to 0.625 m amp pulse width shorter to 200 microseconds. Off time change 3 minutes.   Husband reports seeing a "Grand mal" , arms extended , face is blue, foaming, moaning . Eyes open, non responsive. Bites the tip of her tongue. Husband was swiping the magnet during the spell. Review of Systems: Out of a complete 14 system review, the patient complains of only the  following symptoms, and all other reviewed systems are negative. JME on VNS  Migraine headaches.  Frequent morning headaches and they get worse as the day goes on. Photophobia. Nausea.   History   Social History  . Marital Status: Married    Spouse Name: Traci [name]  . Number of Children: 2  . Years of Education: Some colle   Occupational History  . Not on file.   Social History Main Topics  . Smoking status: Never Smoker   . Smokeless tobacco: Not on file  . Alcohol Use: No  . Drug Use: No  . Sexual Activity: Not on file   Other Topics Concern  . Not on file   Social History Narrative   Daily caffeine consumption- 1 cup    Family History  Problem Relation Age of Onset  . Cancer Mother   . Diabetes Father   . Cancer Maternal Grandmother   . Cancer Maternal Grandfather   . Diabetes Paternal Grandmother   . Diabetes Paternal Grandfather     Past Medical History  Diagnosis Date  . Seizures   . Headache   . History of kidney stones   . Interstitial cystitis     Past Surgical History  Procedure Laterality Date  . Knee surgery Right   . Cholecystectomy    . Appendectomy    . Cesarean section      2  . Laparoscopy    . Tubal ligation    . Vagus nerve stimulator insertion N/A 07/04/2014    Procedure: VAGAL NERVE STIMULATOR IMPLANT;  Surgeon: Lisbeth Renshaw, MD;  Location: [facility];  Service: Neurosurgery;  Laterality: N/A;    Current Outpatient Prescriptions  Medication Sig Dispense Refill  . ibuprofen (ADVIL,MOTRIN) 200 MG tablet Take 200 mg by mouth every 6 (six) hours as needed for mild pain or moderate pain.    Marland Kitchen lacosamide (VIMPAT) 200 MG TABS tablet Take 100-200 mg by mouth 2 (two) times daily. 1 in am and 1/2 in PM    . topiramate (TOPAMAX) 25 MG tablet Take 25 mg by mouth 2 (two) times daily.    . butorphanol (STADOL) 10 MG/ML nasal spray Place 1 spray into the nose every 4 (four) hours as needed for headache.   4  . chlorproMAZINE (THORAZINE) 100 MG  tablet Take 1 tablet (100 mg total) by mouth 2 (two) times daily. (Patient not taking: Reported on 10/07/2014) 6 tablet 0  . promethazine (PHENERGAN) 12.5 MG  tablet Take 1 tablet (12.5 mg total) by mouth every 8 (eight) hours as needed for nausea or vomiting. (Patient not taking: Reported on 10/07/2014) 20 tablet 0   No current facility-administered medications for this visit.    Allergies as of 10/07/2014  . (No Known Allergies)    Vitals: BP 124/90 mmHg  Pulse 94  Resp 20  Ht 5\' 2"  (1.575 m)  Wt 237 lb (107.502 kg)  BMI 43.34 kg/m2 Last Weight:  Wt Readings from Last 1 Encounters:  10/07/14 237 lb (107.502 kg)   Last Height:   Ht Readings from Last 1 Encounters:  10/07/14 5\' 2"  (1.575 m)    Physical exam:  General: The patient is awake, alert and appears not in acute distress. The patient is well groomed. Head: Normocephalic, atraumatic. Neck is supple. Mallampati 3 , neck circumference: 14.5  Cardiovascular:  Regular rate and rhythm without  murmurs or carotid bruit, and without distended neck veins.  The neck scar is visibel and about 3 inches long,   The chest scar is 2 inches long.  Respiratory: Lungs are clear to auscultation. Skin:  Without evidence of edema, or rash Trunk: BMI is very  elevated and patient has a slouched posture.  Neurologic exam : The patient is awake and alert, oriented to place and time.  Memory subjective described as intact. Amnesia for her recent hospital visit, pain medication induced. There is a normal attention span & concentration ability. Speech is fluent without  Dysarthria, but she has notable  dysphonia . Mood and affect are apprehensive . Cranial nerves: Pupils are equal and briskly reactive to light. Funduscopic exam without evidence of pallor or edema.  Extraocular movements  in vertical and horizontal planes intact and without nystagmus.  Visual fields by finger perimetry are intact. Hearing to finger rub intact.  Facial sensation  intact to fine touch.  Facial motor strength is symmetric and tongue and uvula move midline.  Tongue protrusion into either cheek is normal. Shoulder shrug is normal.  Motor exam:  Normal tone, muscle bulk and symmetric  strength in all extremities. Sensory:  Fine touch, pinprick and vibration were tested in all extremities.  Proprioception was normal. Coordination: Rapid alternating movements in the fingers/hands were normal. Finger-to-nose maneuver  normal without evidence of ataxia, dysmetria or tremor. Gait and station: Patient walks without assistive device -unassisted up to the exam table. Wider based gait due to obesity/   Strength within normal limits. Stance is stable and normal. Tandem gait is unfragmented. Romberg testing is negative  Deep tendon reflexes: in the upper and lower extremities are symmetric and intact. No clonus/ Babinski maneuver response is downgoing.   Assessment the patient has a diagnosis of juvenile myoclonic epilepsy but also by description of her spouse and others and has absence Seizures. She has frequent migraines.  Plan the VNS stimulator is supposed to reduce the number of epileptic seizures in daytime as well as at nighttime given its AutoSet setting sensitive to the patient's heart rate. Patients VNS was reset in 4 modalities. Current seizure frequency is 1-2 a week.   Porfirio Mylar Coraima Tibbs MD 10/07/2014   Cc Dr Huston Foley , Dr Lucila Maine.

## 2014-10-07 NOTE — Patient Instructions (Signed)
Zolpidem tablets What is this medicine? ZOLPIDEM (zole PI dem) is used to treat insomnia. This medicine helps you to fall asleep and sleep through the night. This medicine may be used for other purposes; ask your health care provider or pharmacist if you have questions. COMMON BRAND NAME(S): Ambien What should I tell my health care provider before I take this medicine? They need to know if you have any of these conditions: -depression -history of a drug or alcohol abuse problem -liver disease -lung or breathing disease -suicidal thoughts -an unusual or allergic reaction to zolpidem, other medicines, foods, dyes, or preservatives -pregnant or trying to get pregnant -breast-feeding How should I use this medicine? Take this medicine by mouth with a glass of water. Follow the directions on the prescription label. It is better to take this medicine on an empty stomach and only when you are ready for bed. Do not take your medicine more often than directed. If you have been taking this medicine for several weeks and suddenly stop taking it, you may get unpleasant withdrawal symptoms. Your doctor or health care professional may want to gradually reduce the dose. Do not stop taking this medicine on your own. Always follow your doctor or health care professional's advice. A special MedGuide will be given to you by the pharmacist with each prescription and refill. Be sure to read this information carefully each time. Talk to your pediatrician regarding the use of this medicine in children. Special care may be needed. Overdosage: If you think you have taken too much of this medicine contact a poison control center or emergency room at once. NOTE: This medicine is only for you. Do not share this medicine with others. What if I miss a dose? This does not apply. This medicine should only be taken immediately before going to sleep. Do not take double or extra doses. What may interact with this  medicine? -herbal medicines like kava kava, melatonin, St. John's wort and valerian -medicines for fungal infections like ketoconazole, fluconazole, or itraconazole -medicines for treating depression or other mental problems -other medicines given for sleep -some medicines for Parkinson' s disease or other movement disorders -some medicines used to treat HIV infection or AIDS, like ritonavir This list may not describe all possible interactions. Give your health care provider a list of all the medicines, herbs, non-prescription drugs, or dietary supplements you use. Also tell them if you smoke, drink alcohol, or use illegal drugs. Some items may interact with your medicine. What should I watch for while using this medicine? Visit your doctor or health care professional for regular checks on your progress. Keep a regular sleep schedule by going to bed at about the same time each night. Avoid caffeine-containing drinks in the evening hours. When sleep medicines are used every night for more than a few weeks, they may stop working. Talk to your doctor if you still have trouble sleeping. Do not take this medicine unless you are able to get a full night's sleep before you must be active again. You may not be able to remember things that you do in the hours after you take this medicine. Some people have reported driving, making phone calls, or preparing and eating food while asleep after taking sleep medicine. Take this medicine right before going to sleep. Tell your doctor if you are have any problems with your memory. After you stop taking this medicine, you may have trouble falling asleep. This is called rebound insomnia. This problem usually goes away   on its own after 1 or 2 nights. You may get drowsy or dizzy. Do not drive, use machinery, or do anything that needs mental alertness until you know how this medicine affects you. Do not stand or sit up quickly, especially if you are an older patient. This  reduces the risk of dizzy or fainting spells. Alcohol may interfere with the effect of this medicine. Avoid alcoholic drinks. This medicine may cause a decrease in mental alertness the morning after use, even if you feel that you are fully awake. Tell your doctor if you will need to perform activities requiring full alertness, such as driving, the next morning after you have taken this medicine. If you or your family notice any changes in your behavior, or if you have any unusual or disturbing thoughts, call your doctor right away. What side effects may I notice from receiving this medicine? Side effects that you should report to your doctor or health care professional as soon as possible: -allergic reactions like skin rash, itching or hives, swelling of the face, lips, or tongue -changes in vision -confusion -depressed mood -feeling faint or lightheaded, falls -hallucinations -problems with balance, speaking, walking -restlessness, excitability, or feelings of agitation -unusual activities while asleep like driving, eating, making phone calls Side effects that usually do not require medical attention (report to your doctor or health care professional if they continue or are bothersome): -diarrhea -dizziness, or daytime drowsiness, sometimes called a hangover effect -headache This list may not describe all possible side effects. Call your doctor for medical advice about side effects. You may report side effects to FDA at 1-800-FDA-1088. Where should I keep my medicine? Keep out of the reach of children. This medicine can be abused. Keep your medicine in a safe place to protect it from theft. Do not share this medicine with anyone. Selling or giving away this medicine is dangerous and against the law. Store at room temperature between 20 and 25 degrees C (68 and 77 degrees F). Throw away any unused medicine after the expiration date. NOTE: This sheet is a summary. It may not cover all possible  information. If you have questions about this medicine, talk to your doctor, pharmacist, or health care provider.  2015, Elsevier/Gold Standard. (2012-01-31 16:54:48)  

## 2014-10-08 ENCOUNTER — Ambulatory Visit: Payer: BLUE CROSS/BLUE SHIELD | Admitting: Neurology

## 2014-10-13 ENCOUNTER — Other Ambulatory Visit: Payer: Self-pay | Admitting: Neurology

## 2014-10-13 DIAGNOSIS — R519 Headache, unspecified: Secondary | ICD-10-CM

## 2014-10-13 DIAGNOSIS — R51 Headache: Principal | ICD-10-CM

## 2014-10-13 MED ORDER — BUTALBITAL-APAP-CAFFEINE 50-325-40 MG PO TABS: 1.0000 | ORAL_TABLET | Freq: Four times a day (QID) | ORAL | Status: DC | PRN

## 2014-10-13 NOTE — Telephone Encounter (Signed)
Pt's husband called and would like to know if pt can have something for pain for pts migraines. Please call and advise [phone]

## 2014-10-13 NOTE — Telephone Encounter (Signed)
Called husband . I have explained in my last office visit with the patient that she was referred her solely for seizure management.  Her seizures have greatly improved since she has a vagal nerve stimulator implanted. 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  This expectation has not come true.  I will offer her a referral to a headache specialist Dr. Neale Burly.  Dr. Anne Hahn had tried Thorazine with her which she also could not find helpful. Her husband  requests Stadol nasal spray,  which I'm reluctant to fill as she has frequent severe headaches and could abuse the medication.  There were 4 refills on stadol written on 06-20-14 by [facility]- these must have been used up. I will give her 20 fioricet, no refills and a referral.  Traci [name]. MD

## 2014-11-12 ENCOUNTER — Encounter: Payer: Self-pay | Admitting: Neurology

## 2014-11-12 ENCOUNTER — Ambulatory Visit (INDEPENDENT_AMBULATORY_CARE_PROVIDER_SITE_OTHER): Payer: BLUE CROSS/BLUE SHIELD | Admitting: Neurology

## 2014-11-12 VITALS — BP 134/90 | HR 81 | Ht 62.0 in | Wt 242.0 lb

## 2014-11-12 DIAGNOSIS — G43711 Chronic migraine without aura, intractable, with status migrainosus: Secondary | ICD-10-CM

## 2014-11-12 MED ORDER — SUMATRIPTAN SUCCINATE 11 MG/NOSEPC NA EXHP: 2.0000 | INHALANT_POWDER | Freq: Once | NASAL | Status: DC

## 2014-11-12 MED ORDER — VERAPAMIL HCL 40 MG PO TABS: 40.0000 mg | ORAL_TABLET | Freq: Three times a day (TID) | ORAL | Status: DC

## 2014-11-12 MED ORDER — ZOLMITRIPTAN 5 MG NA SOLN: 1.0000 | NASAL | Status: DC | PRN

## 2014-11-12 NOTE — Patient Instructions (Signed)
Overall you are doing fairly well but I do want to suggest a few things today:   Remember to drink plenty of fluid, eat healthy meals and do not skip any meals. Try to eat protein with a every meal and eat a healthy snack such as fruit or nuts in between meals. Try to keep a regular sleep-wake schedule and try to exercise daily, particularly in the form of walking, 20-30 minutes a day, if you can.   As far as your medications are concerned, I would like to suggest: Verapamil  three times a day Follow up for spehenocath zomig or Onzetra at onset of headache  As far as diagnostic testing: MRI vs CT will call  I would like to see you back in one week for sphenocath, sooner if we need to. Please call us with any interim questions, concerns, problems, updates or refill requests.   Please also call us for any test results so we can go over those with you on the phone.  My clinical assistant and will answer any of your questions and relay your messages to me and also relay most of my messages to you.   Our phone number is [phone]. We also have an after hours call service for urgent matters and there is a physician on-call for urgent questions. For any emergencies you know to call 911 or go to the nearest emergency room

## 2014-11-12 NOTE — Progress Notes (Signed)
[facility]    Provider:  Dr Lucia Gaskins Referring Provider: Heywood Bene, MD Primary Care Physician:  Lucila Maine, MD  CC:  migraines  HPI:  Traci [name] is a 39 y.o. female here as a referral from Dr. Lorin Picket for migraines. PMHx juvenile myoclonic epilepsy s/p VNS.   Migraines for many years. She has 15-20 headaches monthly. 8 or more migraines a month. Symptoms are right sided,pounding and throbbing. She can feel her heartbeat in her eye. She has moments of sharp and stabbing pain. Headaches started in middle school. Getting worse. endorses nausea and vomiting, light sensitivity and sound sensitivity. She has tried botox in the past and it didn't help at previous neurologist. Jamal Maes it twice. She is on Topamax for epilepsy as well as Vimpat. Tried Depakote in the past. Propranolol was also tried. Verapamil was not tried. Fioricet tried for acute magaement. She was on butorphenol nose spray at one point but overused it. Has not tried nortriptyline or amitriptyline. Migraines worsened a year ago. Previous to this her headaches were sporadic. Unclear what triggered the headaches or why they have been worsening. Headaches are worse the week of her menstrual cycle. She doesn't snore at night. She has trouble initiating sleep. Headaches start shortly after she wakes up. She has tried triptans as well as injectable imitrex. She takes ambien at night to help with sleep. No triggers or patterns that she can distinguish. No weakness or neck stiffness or fever/chills or other systemic signs.   Review of Systems: Patient complains of symptoms per HPI as well as the following symptoms: dizziness, headache, seizure, passing out, neck pain, insomnia, frequent waking, nausea, vomiting. Pertinent negatives per HPI. All others negative.   Social History   Social History  . Marital Status: Married    Spouse Name: Traci [name]  . Number of Children: 2  . Years of Education: Some colle    Occupational History  . Not on file.   Social History Main Topics  . Smoking status: Never Smoker   . Smokeless tobacco: Not on file  . Alcohol Use: No  . Drug Use: No  . Sexual Activity: Not on file   Other Topics Concern  . Not on file   Social History Narrative   Lives at home with husband, Traci [name]   Daily caffeine consumption- 1 cup    Family History  Problem Relation Age of Onset  . Cancer Mother   . Diabetes Father   . Cancer Maternal Grandmother   . Cancer Maternal Grandfather   . Diabetes Paternal Grandmother   . Diabetes Paternal Grandfather   . Migraines Neg Hx     Past Medical History  Diagnosis Date  . Seizures   . Headache   . History of kidney stones   . Interstitial cystitis     Past Surgical History  Procedure Laterality Date  . Knee surgery Right   . Cholecystectomy    . Appendectomy    . Cesarean section      2  . Laparoscopy    . Tubal ligation    . Vagus nerve stimulator insertion N/A 07/04/2014    Procedure: VAGAL NERVE STIMULATOR IMPLANT;  Surgeon: Lisbeth Renshaw, MD;  Location: [facility];  Service: Neurosurgery;  Laterality: N/A;    Current Outpatient Prescriptions  Medication Sig Dispense Refill  . ibuprofen (ADVIL,MOTRIN) 200 MG tablet Take 200 mg by mouth every 6 (six) hours as needed for mild pain or moderate pain.    Marland Kitchen  lacosamide (VIMPAT) 200 MG TABS tablet Take 0.5-1 tablets (100-200 mg total) by mouth 2 (two) times daily. 1 in am and 1/2 in PM 60 tablet 5  . topiramate (TOPAMAX) 25 MG tablet 75 mg at night 90 tablet 5  . zolpidem (AMBIEN CR) 12.5 MG CR tablet Take 1 tablet (12.5 mg total) by mouth at bedtime as needed for sleep. 30 tablet 2  . promethazine (PHENERGAN) 12.5 MG tablet Take 1 tablet (12.5 mg total) by mouth every 8 (eight) hours as needed for nausea or vomiting. (Patient not taking: Reported on 11/12/2014) 20 tablet 0  . SUMAtriptan Succinate (ONZETRA XSAIL) 11 MG/NOSEPC EXHP Place 2 sprays into the nose once. 2  each 0  . verapamil (CALAN) 40 MG tablet Take 1 tablet (40 mg total) by mouth 3 (three) times daily. 90 tablet 6  . zolmitriptan (ZOMIG) 5 MG nasal solution Place 1 spray into the nose as needed for migraine. 6 Units 0   No current facility-administered medications for this visit.    Allergies as of 11/12/2014  . (No Known Allergies)    Vitals: BP 134/90 mmHg  Pulse 81  Ht 5\' 2"  (1.575 m)  Wt 242 lb (109.77 kg)  BMI 44.25 kg/m2 Last Weight:  Wt Readings from Last 1 Encounters:  11/12/14 242 lb (109.77 kg)   Last Height:   Ht Readings from Last 1 Encounters:  11/12/14 5\' 2"  (1.575 m)   Physical exam: Exam: Gen: NAD, conversant, well nourised, obese, well groomed                     CV: RRR, no MRG. No Carotid Bruits. No peripheral edema, warm, nontender Eyes: Conjunctivae clear without exudates or hemorrhage  Neuro: Detailed Neurologic Exam  Speech:    Speech is normal; fluent and spontaneous with normal comprehension.  Cognition:    The patient is oriented to person, place, and time;  Cranial Nerves:    The pupils are equal, round, and reactive to light. The fundi are flat. Visual fields are full to finger confrontation. Extraocular movements are intact. Trigeminal sensation is intact and the muscles of mastication are normal. The face is symmetric. The palate elevates in the midline. Hearing intact. Voice is normal. Shoulder shrug is normal. The tongue has normal motion without fasciculations.   Gait:    Heel-toe and tandem gait are normal.   Motor Observation:    No asymmetry, no atrophy, and no involuntary movements noted. Tone:    Normal muscle tone.    Posture:    Posture is normal. normal erect    Strength:    Strength is V/V in the upper and lower limbs.      Sensation: intact to LT     Assessment/Plan:  39 year old with chronic migraines, intractable. She has seen previous neurologists and tried multiple medications for both preventative and acute  management. Can try Verapamil: Will start Verapamil. Discussed side effects which can include Dizziness, slow heartbeat, constipation, stomach upset, nausea, headache, and tiredness. If any of these effects persist or worsen or you experience anything else please stop medication and call us. Also gave UpToDate patient handout on the medication. Will try ZOmig or Onzetra atonset of headache, provided sample. If they work for acute management we can order them. I would like an MRI of the brain given worsening headaches but unsure of this can be done with her VNS. Will discuss with Dr. Vickey Huger. She tried botox in the past, said  it did not help. May reconsider.   Naomie Dean, MD  [facility] [address], [place] [address]  Phone [phone] Fax [phone]  A total of 30 minutes was spent face-to-face with this patient. Over half this time was spent on counseling patient on the migraine diagnosis and different diagnostic and therapeutic options available.

## 2014-11-16 ENCOUNTER — Encounter: Payer: Self-pay | Admitting: Neurology

## 2014-11-16 DIAGNOSIS — G43901 Migraine, unspecified, not intractable, with status migrainosus: Secondary | ICD-10-CM | POA: Insufficient documentation

## 2014-11-16 DIAGNOSIS — G43711 Chronic migraine without aura, intractable, with status migrainosus: Secondary | ICD-10-CM | POA: Insufficient documentation

## 2014-11-24 ENCOUNTER — Ambulatory Visit (INDEPENDENT_AMBULATORY_CARE_PROVIDER_SITE_OTHER): Payer: BLUE CROSS/BLUE SHIELD | Admitting: Neurology

## 2014-11-24 ENCOUNTER — Encounter: Payer: Self-pay | Admitting: Neurology

## 2014-11-24 ENCOUNTER — Telehealth: Payer: Self-pay | Admitting: Neurology

## 2014-11-24 ENCOUNTER — Other Ambulatory Visit: Payer: Self-pay | Admitting: Neurology

## 2014-11-24 VITALS — BP 129/89 | HR 78 | Ht 62.0 in | Wt 237.4 lb

## 2014-11-24 DIAGNOSIS — R51 Headache: Principal | ICD-10-CM

## 2014-11-24 DIAGNOSIS — G43011 Migraine without aura, intractable, with status migrainosus: Secondary | ICD-10-CM

## 2014-11-24 DIAGNOSIS — R519 Headache, unspecified: Secondary | ICD-10-CM

## 2014-11-24 DIAGNOSIS — H539 Unspecified visual disturbance: Secondary | ICD-10-CM

## 2014-11-24 NOTE — Telephone Encounter (Signed)
Traci [name] call patient and let her know that her VNS is compatible with MRI of the brain so I went ahead and ordered it as we dicussed during our first meeting. Thanks!

## 2014-11-24 NOTE — Telephone Encounter (Signed)
Called and spoke w/ pt to let her know VNS is compatible with  MRI brain so Dr. Lucia Gaskins ordered it. Someone should be calling to schedule her. She verbalized understanding.

## 2014-11-24 NOTE — Telephone Encounter (Signed)
Thank you :)

## 2014-11-24 NOTE — Telephone Encounter (Signed)
Yes , she can . Magan McCoy from VNS has given me a report that they are compatible. CD

## 2014-11-24 NOTE — Progress Notes (Signed)
    [facility] PROCEDURE NOTE  HPI: Traci [name] is a 39 y.o. female here as a referral from Dr. Lorin Picket for migraines. PMHx juvenile myoclonic epilepsy s/p VNS.   Migraines for many years. She has 15-20 headaches monthly. 8 or more migraines a month. Symptoms are right sided,pounding and throbbing. She can feel her heartbeat in her eye. She has moments of sharp and stabbing pain. Headaches started in middle school. Getting worse. endorses nausea and vomiting, light sensitivity and sound sensitivity. She has tried botox in the past and it didn't help at previous neurologist. Jamal Maes it twice. She is on Topamax for epilepsy as well as Vimpat. Tried Depakote in the past. Propranolol was also tried. Verapamil was not tried. Fioricet tried for acute magaement. She was on butorphenol nose spray at one point but overused it. Has not tried nortriptyline or amitriptyline. Migraines worsened a year ago. Previous to this her headaches were sporadic. Unclear what triggered the headaches or why they have been worsening. Headaches are worse the week of her menstrual cycle. She doesn't snore at night. She has trouble initiating sleep. Headaches start shortly after she wakes up. She has tried triptans as well as injectable imitrex. She takes ambien at night to help with sleep. No triggers or patterns that she can distinguish. No weakness or neck stiffness or fever/chills or other systemic signs.   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 She has seen previous neurologists and tried multiple medications for both preventative and acute management. Can try Verapamil: Will start Verapamil. Discussed side effects which can include Dizziness, slow heartbeat, constipation, stomach upset, nausea, headache, and tiredness. If any of these effects persist or worsen or you experience anything else please stop medication and call us. Also gave UpToDate patient handout on the medication. Will try ZOmig or Onzetra atonset of  headache, provided sample. If they work for acute management we can order them. I would like an MRI of the brain given worsening headaches but unsure of this can be done with her VNS. Will discuss with Dr. Vickey Huger. She tried botox in the past, said it did not help. May reconsider.   Procedure: The patient was placed in the supine position. A temperature strip was added to the cheek area after the area was cleaned with alcohol. The Sphenocath was lubricated with gel, and placed in the right naris. The catheter was inserted above the middle turbinate to the posterior nasal cavity, and then withdrawn 1 cm. The catheter was deployed and rotated approximately 20 towards the nose. 2-1/2 mL of 2% lidocaine was deployed. The patient was asked to swallow during the injection. The patient demonstrated erythema of the sclera of the eye on this side, and an increase in the cheek temperature was noted from 92 F to 96 F.  This process was repeated on the left side, with similar results. The increase in cheek temperature was documented from 62 F to 48 F.  The patient tolerated the procedure well. No complications of the procedure were noted. The patient was kept in the supine position for 8 minutes following the procedure. She was given small sips of water after sitting up following the procedure.  Lidocaine 2% NDC 16109-604-54  Expiration date: 12/2017 Lot number: [id number]  Anson Fret

## 2014-11-24 NOTE — Telephone Encounter (Signed)
Dr. Lucia Gaskins-- Carollee Herter said in order for pt to have MRI, she would have to have VNS shut off and then do the MRI and have a doctor turn it back on. Then fu with neurologist. Lennette Bihari is receiving a fax and will have it tomorrow from company to explain procedure needed to do this.

## 2014-11-24 NOTE — Telephone Encounter (Signed)
Dr. Vickey Huger -   I saw your patient today for sphenocath. I also started her on Verapamil. I would like to order an MRI of the brain due to worsning headaches. Can you tell me please if she can have an MRI of the brain with a VNS? Thank you.

## 2014-11-27 NOTE — Telephone Encounter (Signed)
Pt scheduled 0ctober 11 for hospital. [employer] is going to monitor VNS and turn on/off.

## 2014-12-02 ENCOUNTER — Telehealth: Payer: Self-pay | Admitting: *Deleted

## 2014-12-02 NOTE — Telephone Encounter (Signed)
Called and spoke to pt and offered 12pm tomorrow with Dr Anne Hahn for sphenocath because Dr. Lucia Gaskins is out of the office due to a family emergency. Told her to check in at 1145am. She said this would be okay. She wanted to have sphenocath done same day as MRI on 10/11 but dr Lucia Gaskins was full that day. She is having MRI at 1145am that morning. She is coming to our office at 1015 to have VNS turned off.

## 2014-12-03 ENCOUNTER — Encounter: Payer: Self-pay | Admitting: Neurology

## 2014-12-03 ENCOUNTER — Ambulatory Visit (INDEPENDENT_AMBULATORY_CARE_PROVIDER_SITE_OTHER): Payer: 59 | Admitting: Neurology

## 2014-12-03 ENCOUNTER — Ambulatory Visit: Payer: BLUE CROSS/BLUE SHIELD | Admitting: Neurology

## 2014-12-03 VITALS — BP 108/88 | HR 92

## 2014-12-03 DIAGNOSIS — G43711 Chronic migraine without aura, intractable, with status migrainosus: Secondary | ICD-10-CM

## 2014-12-03 NOTE — Procedures (Signed)
    [facility] PROCEDURE NOTE  History: Traci [name] is a 39 year old patient with a history of intractable migraine headaches since a teenager. She is having virtually daily headaches, medications and Botox injections previously were not effective. The patient comes in for a Sphenocath injection. She indicates that the injection last week resulted in one or 2 days without headache.  Procedure: The patient was placed in the supine position. A temperature strip was added to the cheek area after the area was cleaned with alcohol. The Sphenocath was lubricated with gel, and placed in the right naris. The catheter was inserted above the middle turbinate to the posterior nasal cavity, and then withdrawn 1 cm. The catheter was deployed and rotated approximately 20 towards the nose. 2-1/2 mL of 2% lidocaine was deployed. The patient was asked to swallow during the injection. The patient demonstrated erythema of the sclera of the eye on this side, and an increase in the cheek temperature was noted from 94 F to 96 F.  This process was repeated on the left side, with similar results. The increase in cheek temperature was documented from 13 F to 96 F.  The patient tolerated the procedure well. No complications of the procedure were noted. The patient was kept in the supine position for 8 minutes following the procedure. She was given small sips of water after sitting up following the procedure.  Lidocaine 2% NDC 19147-829-56  Expiration date: 11/19 Lot number: [id number]  [name]

## 2014-12-09 ENCOUNTER — Ambulatory Visit (INDEPENDENT_AMBULATORY_CARE_PROVIDER_SITE_OTHER): Payer: 59 | Admitting: Neurology

## 2014-12-09 ENCOUNTER — Ambulatory Visit (HOSPITAL_COMMUNITY)
Admission: RE | Admit: 2014-12-09 | Discharge: 2014-12-09 | Disposition: A | Discharge status: Home / Self Care | Payer: 59 | Source: Ambulatory Visit | Attending: Neurology | Admitting: Neurology

## 2014-12-09 ENCOUNTER — Telehealth: Payer: Self-pay | Admitting: *Deleted

## 2014-12-09 ENCOUNTER — Other Ambulatory Visit: Payer: Self-pay

## 2014-12-09 ENCOUNTER — Encounter: Payer: Self-pay | Admitting: Neurology

## 2014-12-09 VITALS — BP 136/95 | HR 119 | Temp 98.8°F | Ht 62.0 in

## 2014-12-09 DIAGNOSIS — H539 Unspecified visual disturbance: Secondary | ICD-10-CM | POA: Diagnosis present

## 2014-12-09 DIAGNOSIS — R51 Headache: Secondary | ICD-10-CM | POA: Insufficient documentation

## 2014-12-09 DIAGNOSIS — G40309 Generalized idiopathic epilepsy and epileptic syndromes, not intractable, without status epilepticus: Secondary | ICD-10-CM | POA: Diagnosis not present

## 2014-12-09 DIAGNOSIS — G43011 Migraine without aura, intractable, with status migrainosus: Secondary | ICD-10-CM

## 2014-12-09 DIAGNOSIS — G40409 Other generalized epilepsy and epileptic syndromes, not intractable, without status epilepticus: Secondary | ICD-10-CM

## 2014-12-09 DIAGNOSIS — R519 Headache, unspecified: Secondary | ICD-10-CM

## 2014-12-09 DIAGNOSIS — Z9689 Presence of other specified functional implants: Secondary | ICD-10-CM

## 2014-12-09 NOTE — Progress Notes (Signed)
Sphenocath procedure:  Temperature strip on cheeks: Before procedure: Left=100 degrees, Right=100 degrees After procedure: Left=102 degrees, Right= 102 degrees After procedure:  Lidocaine 2%- 5mL total Lot: [id number] Expiration: 12/2017 NDC: 45409-811-91

## 2014-12-09 NOTE — Telephone Encounter (Signed)
Spoke w/ pt about normal MRI results. Pt verbalized understanding.

## 2014-12-09 NOTE — Progress Notes (Signed)
Traci [name] returned from her MRI brain and for the procedure her VNS stimulator had to be switched off. System diagnostic data as of 1400 hrs. show the model to be ASPIRE S 106, serial number is [id number], implanted on 07-04-14, impedance value is 3912ohms. Prior to today's switch off the patient had an output of 0.5 mA, signal frequency was 20 Hz, pulse width was 250 s, signal on time 30 seconds take off time 3 minutes. I will stimulation set for an output current of 0.625 mA air pulse with 2 or 50 s, signal on time 60 seconds. Magnet output 0.750 mA air, pulse width 250 s signal on time 60 seconds.   Artist and will be bumped up to 0.75, normal multiple now be at 0.625 mA and output current will be set to 0.875 mA.  Since the patient's stimulator is new there is no battery life interrogation necessary ( 100%).   We also reviewed the tachycardia detection since implanted titration the patient had 1059 magnet activations for tachycardia.   Melvyn Novas , MD

## 2014-12-09 NOTE — Progress Notes (Signed)
VNS reps are here for the VNS turn off prior to pt's MRI. Received verbal orders from Dr. Vickey Huger for the VNS reps to turn the VNS off. Dr. Vickey Huger said she will be present when the reps turn the VNS back on. VNS reps aware of this order.

## 2014-12-09 NOTE — Telephone Encounter (Signed)
-----   Message from Anson Fret, MD sent at 12/09/2014  4:09 PM EDT ----- MRI of the brain normal, please let patient know thanks.

## 2014-12-09 NOTE — Progress Notes (Signed)
    [facility] PROCEDURE NOTE  HPI: Traci [name] is a 39 y.o. female here as a referral from Dr. Lorin Picket for migraines. PMHx juvenile myoclonic epilepsy s/p VNS.   Migraines for many years. She has 15-20 headaches monthly. 8 or more migraines a month. Symptoms are right sided,pounding and throbbing. She can feel her heartbeat in her eye. She has moments of sharp and stabbing pain. Headaches started in middle school. Getting worse. endorses nausea and vomiting, light sensitivity and sound sensitivity. She has tried botox in the past and it didn't help at previous neurologist. Jamal Maes it twice. She is on Topamax for epilepsy as well as Vimpat. Tried Depakote in the past. Propranolol was also tried. Verapamil was not tried. Fioricet tried for acute magaement. She was on butorphenol nose spray at one point but overused it. Has not tried nortriptyline or amitriptyline. Migraines worsened a year ago. Previous to this her headaches were sporadic. Unclear what triggered the headaches or why they have been worsening. Headaches are worse the week of her menstrual cycle. She doesn't snore at night. She has trouble initiating sleep. Headaches start shortly after she wakes up. She has tried triptans as well as injectable imitrex. She takes ambien at night to help with sleep. No triggers or patterns that she can distinguish. No weakness or neck stiffness or fever/chills or other systemic signs.   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 She has seen previous neurologists and tried multiple medications for both preventative and acute management. Can try Verapamil: Will start Verapamil. Discussed side effects which can include Dizziness, slow heartbeat, constipation, stomach upset, nausea, headache, and tiredness. If any of these effects persist or worsen or you experience anything else please stop medication and call us. Also gave UpToDate patient handout on the medication. Will try ZOmig or Onzetra atonset of  headache, provided sample. If they work for acute management we can order them. I would like an MRI of the brain given worsening headaches but unsure of this can be done with her VNS. Will discuss with Dr. Vickey Huger. She tried botox in the past, said it did not help. May reconsider.  Procedure: The patient was placed in the supine position. A temperature strip was added to the cheek area after the area was cleaned with alcohol. The Sphenocath was lubricated with gel, and placed in the right naris. The catheter was inserted above the middle turbinate to the posterior nasal cavity, and then withdrawn 1 cm. The catheter was deployed and rotated approximately 20 towards the nose. 2-1/2 mL of 2% lidocaine was deployed. The patient was asked to swallow during the injection. The patient demonstrated erythema of the sclera of the eye on this side, and an increase in the cheek temperature was noted from 100 F to 102 F.  This process was repeated on the left side, with similar results. The increase in cheek temperature was documented from 100 F to 102 F.  The patient tolerated the procedure well. No complications of the procedure were noted. The patient was kept in the supine position for 8 minutes following the procedure. She was given small sips of water after sitting up following the procedure.  Sphenocath procedure:  Temperature strip on cheeks:  Before procedure: Left=100 degrees, Right=100 degrees  After procedure: Left=102 degrees, Right= 102 degrees  After procedure:  Lidocaine 2%- 5mL total  Lot: [id number]  Expiration: 12/2017  NDC: 29562-130-86   Anson Fret

## 2014-12-10 ENCOUNTER — Emergency Department (HOSPITAL_COMMUNITY): Payer: 59

## 2014-12-10 ENCOUNTER — Encounter (HOSPITAL_COMMUNITY): Payer: Self-pay | Admitting: Family Medicine

## 2014-12-10 ENCOUNTER — Emergency Department (HOSPITAL_COMMUNITY)
Admission: EM | Admit: 2014-12-10 | Discharge: 2014-12-11 | Disposition: A | Discharge status: Home / Self Care | Payer: 59 | Attending: Emergency Medicine | Admitting: Emergency Medicine

## 2014-12-10 DIAGNOSIS — R1013 Epigastric pain: Secondary | ICD-10-CM

## 2014-12-10 DIAGNOSIS — Z79899 Other long term (current) drug therapy: Secondary | ICD-10-CM | POA: Diagnosis not present

## 2014-12-10 DIAGNOSIS — K219 Gastro-esophageal reflux disease without esophagitis: Secondary | ICD-10-CM | POA: Insufficient documentation

## 2014-12-10 DIAGNOSIS — Z3202 Encounter for pregnancy test, result negative: Secondary | ICD-10-CM | POA: Diagnosis not present

## 2014-12-10 DIAGNOSIS — Z87442 Personal history of urinary calculi: Secondary | ICD-10-CM | POA: Insufficient documentation

## 2014-12-10 LAB — CBC
HCT: 42.7 % (ref 36.0–46.0)
Hemoglobin: 14.3 g/dL (ref 12.0–15.0)
MCH: 30.3 pg (ref 26.0–34.0)
MCHC: 33.5 g/dL (ref 30.0–36.0)
MCV: 90.5 fL (ref 78.0–100.0)
PLATELETS: 362 10*3/uL (ref 150–400)
RBC: 4.72 MIL/uL (ref 3.87–5.11)
RDW: 12.5 % (ref 11.5–15.5)
WBC: 8.9 10*3/uL (ref 4.0–10.5)

## 2014-12-10 LAB — URINALYSIS, ROUTINE W REFLEX MICROSCOPIC
BILIRUBIN URINE: NEGATIVE
GLUCOSE, UA: NEGATIVE mg/dL
KETONES UR: 15 mg/dL — AB
NITRITE: NEGATIVE
PROTEIN: NEGATIVE mg/dL
Specific Gravity, Urine: 1.014 (ref 1.005–1.030)
Urobilinogen, UA: 0.2 mg/dL (ref 0.0–1.0)
pH: 6 (ref 5.0–8.0)

## 2014-12-10 LAB — COMPREHENSIVE METABOLIC PANEL
ALBUMIN: 3.8 g/dL (ref 3.5–5.0)
ALK PHOS: 67 U/L (ref 38–126)
ALT: 31 U/L (ref 14–54)
AST: 26 U/L (ref 15–41)
Anion gap: 12 (ref 5–15)
BILIRUBIN TOTAL: 0.4 mg/dL (ref 0.3–1.2)
BUN: 13 mg/dL (ref 6–20)
CALCIUM: 9.7 mg/dL (ref 8.9–10.3)
CO2: 21 mmol/L — ABNORMAL LOW (ref 22–32)
CREATININE: 0.75 mg/dL (ref 0.44–1.00)
Chloride: 104 mmol/L (ref 101–111)
GFR calc Af Amer: >60 mL/min (ref >60)
GFR calc non Af Amer: >60 mL/min (ref >60)
Glucose, Bld: 94 mg/dL (ref 65–99)
Potassium: 3.8 mmol/L (ref 3.5–5.1)
SODIUM: 137 mmol/L (ref 135–145)
TOTAL PROTEIN: 7.8 g/dL (ref 6.5–8.1)

## 2014-12-10 LAB — LIPASE, BLOOD: Lipase: 41 U/L (ref 22–51)

## 2014-12-10 LAB — URINE MICROSCOPIC-ADD ON

## 2014-12-10 LAB — POC OCCULT BLOOD, ED: FECAL OCCULT BLD: NEGATIVE

## 2014-12-10 LAB — I-STAT BETA HCG BLOOD, ED (MC, WL, AP ONLY): I-stat hCG, quantitative: <5 m[IU]/mL (ref <5)

## 2014-12-10 MED ORDER — ONDANSETRON HCL 4 MG/2ML IJ SOLN
4.0000 mg | Freq: Once | INTRAMUSCULAR | Status: AC
  Administered 2014-12-10: 4 mg via INTRAVENOUS
  Filled 2014-12-10: qty 2

## 2014-12-10 MED ORDER — IOHEXOL 300 MG/ML  SOLN
100.0000 mL | Freq: Once | INTRAMUSCULAR | Status: AC | PRN
  Administered 2014-12-10: 100 mL via INTRAVENOUS

## 2014-12-10 MED ORDER — GI COCKTAIL ~~LOC~~
30.0000 mL | Freq: Once | ORAL | Status: AC
  Administered 2014-12-11: 30 mL via ORAL
  Filled 2014-12-10: qty 30

## 2014-12-10 MED ORDER — MORPHINE SULFATE (PF) 4 MG/ML IV SOLN
4.0000 mg | Freq: Once | INTRAVENOUS | Status: AC
  Administered 2014-12-10: 4 mg via INTRAVENOUS
  Filled 2014-12-10: qty 1

## 2014-12-10 MED ORDER — FENTANYL CITRATE (PF) 100 MCG/2ML IJ SOLN
50.0000 ug | Freq: Once | INTRAMUSCULAR | Status: AC
  Administered 2014-12-10: 50 ug via INTRAVENOUS
  Filled 2014-12-10: qty 2

## 2014-12-10 MED ORDER — LACOSAMIDE 50 MG PO TABS
100.0000 mg | ORAL_TABLET | Freq: Once | ORAL | Status: AC
  Administered 2014-12-10: 100 mg via ORAL
  Filled 2014-12-10: qty 2

## 2014-12-10 MED ORDER — SODIUM CHLORIDE 0.9 % IV BOLUS (SEPSIS)
1000.0000 mL | Freq: Once | INTRAVENOUS | Status: AC
Start: 2014-12-10 — End: 2014-12-10
  Administered 2014-12-10: 1000 mL via INTRAVENOUS

## 2014-12-10 MED ORDER — TOPIRAMATE 25 MG PO TABS
75.0000 mg | ORAL_TABLET | Freq: Once | ORAL | Status: AC
  Administered 2014-12-10: 75 mg via ORAL
  Filled 2014-12-10: qty 3

## 2014-12-10 MED ORDER — FENTANYL CITRATE (PF) 100 MCG/2ML IJ SOLN
50.0000 ug | Freq: Once | INTRAMUSCULAR | Status: AC
  Administered 2014-12-11: 50 ug via INTRAVENOUS
  Filled 2014-12-10: qty 2

## 2014-12-10 MED ORDER — SODIUM CHLORIDE 0.9 % IV SOLN
80.0000 mg | Freq: Once | INTRAVENOUS | Status: DC
  Filled 2014-12-10: qty 80

## 2014-12-10 MED ORDER — SODIUM CHLORIDE 0.9 % IV SOLN
80.0000 mg | Freq: Once | INTRAVENOUS | Status: AC
  Administered 2014-12-10: 80 mg via INTRAVENOUS

## 2014-12-10 NOTE — ED Notes (Signed)
PA at bedside with patient/spouse.

## 2014-12-10 NOTE — ED Notes (Signed)
Pt here for abd pain over the past week. sts worsening today. sts RUQ pain with N,V. sts dizzy.

## 2014-12-10 NOTE — ED Notes (Signed)
Occult card bedside. 

## 2014-12-10 NOTE — ED Notes (Signed)
Patient transported to CT via stretcher.

## 2014-12-10 NOTE — ED Notes (Signed)
Patient transported to X-ray 

## 2014-12-10 NOTE — ED Provider Notes (Signed)
CSN: [id number]     Arrival date & time 12/10/14  1601 History   First MD Initiated Contact with Patient 12/10/14 1854     Chief Complaint  Patient presents with  . Abdominal Pain     (Consider location/radiation/quality/duration/timing/severity/associated sxs/prior Treatment) The history is provided by the patient, medical records and a significant other. No language interpreter was used.     CAMELA WICH is a 39 y.o. female  with a hx of seizures, headache, IC presents to the Emergency Department complaining of gradual, persistent, progressively worsening epigastric abd pain onset 11am.  Pt reports 5 episodes of emesis.  Pt reports 3 episodes of NBNB emesis.  On the 4th time there were flecks and a small streak.  On the 5th time there was a quarter sized clot.  Pt reports the pain is sharp, burning, rated at a 9/10.  Pt with hx of cholecystectomy in 2007.  Pt denies hx of PUD.  She reports nothing aggravates or alleviates the pain.  Pt reports that if she doesn't eat the pain is much worse.  She reports GERD symptoms and early satiety.  Pt denies fever, chills, neck pain, chest pain, SOB, weakness, dizziness, syncope, dysuria.  Pt reports lose stools x 3 weeks as well.  Pt denies recent travel.  She reports beginning Verapamil 3 weeks ago.  She was given an unknown antibiotic and vicodin.  She has not been treated with any PPI in the past.     Past Medical History  Diagnosis Date  . Seizures (HCC)   . Headache   . History of kidney stones   . Interstitial cystitis    Past Surgical History  Procedure Laterality Date  . Knee surgery Right   . Cholecystectomy    . Appendectomy    . Cesarean section      2  . Laparoscopy    . Tubal ligation    . Vagus nerve stimulator insertion N/A 07/04/2014    Procedure: VAGAL NERVE STIMULATOR IMPLANT;  Surgeon: Lisbeth Renshaw, MD;  Location: [facility];  Service: Neurosurgery;  Laterality: N/A;   Family History  Problem Relation Age of  Onset  . Cancer Mother   . Diabetes Father   . Cancer Maternal Grandmother   . Cancer Maternal Grandfather   . Diabetes Paternal Grandmother   . Diabetes Paternal Grandfather   . Migraines Neg Hx    Social History  Substance Use Topics  . Smoking status: Never Smoker   . Smokeless tobacco: None  . Alcohol Use: No   OB History    No data available     Review of Systems  Constitutional: Negative for fever, diaphoresis, appetite change, fatigue and unexpected weight change.  HENT: Negative for mouth sores.   Eyes: Negative for visual disturbance.  Respiratory: Negative for cough, chest tightness, shortness of breath and wheezing.   Cardiovascular: Negative for chest pain.  Gastrointestinal: Positive for nausea, vomiting and abdominal pain. Negative for diarrhea and constipation.  Endocrine: Negative for polydipsia, polyphagia and polyuria.  Genitourinary: Negative for dysuria, urgency, frequency and hematuria.  Musculoskeletal: Negative for back pain and neck stiffness.  Skin: Negative for rash.  Allergic/Immunologic: Negative for immunocompromised state.  Neurological: Negative for syncope, light-headedness and headaches.  Hematological: Does not bruise/bleed easily.  Psychiatric/Behavioral: Negative for sleep disturbance. The patient is not nervous/anxious.       Allergies  Review of patient's allergies indicates no known allergies.  Home Medications   Prior to Admission medications  Medication Sig Start Date End Date Taking? Authorizing Provider  ibuprofen (ADVIL,MOTRIN) 200 MG tablet Take 200 mg by mouth every 6 (six) hours as needed for mild pain or moderate pain.   Yes Historical Provider, MD  lacosamide (VIMPAT) 200 MG TABS tablet Take 0.5-1 tablets (100-200 mg total) by mouth 2 (two) times daily. 1 in am and 1/2 in PM 10/07/14  Yes Melvyn Novasarmen Dohmeier, MD  topiramate (TOPAMAX) 25 MG tablet 75 mg at night 10/07/14  Yes Melvyn Novasarmen Dohmeier, MD  verapamil (CALAN) 40 MG tablet  Take 1 tablet (40 mg total) by mouth 3 (three) times daily. 11/12/14  Yes Anson FretAntonia B Ahern, MD  zolpidem (AMBIEN CR) 12.5 MG CR tablet Take 1 tablet (12.5 mg total) by mouth at bedtime as needed for sleep. 10/07/14  Yes Carmen Dohmeier, MD  ondansetron (ZOFRAN ODT) 4 MG disintegrating tablet 4mg  ODT q4 hours prn nausea/vomit 12/11/14   Jedidiah Demartini, PA-C  oxyCODONE-acetaminophen (PERCOCET) 5-325 MG tablet Take 1-2 tablets by mouth every 6 (six) hours as needed for severe pain. 12/11/14   Joshuan Bolander, PA-C  pantoprazole (PROTONIX) 40 MG tablet Take 1 tablet (40 mg total) by mouth daily. 12/11/14   Lakiesha Ralphs, PA-C   BP 100/66 mmHg  Pulse 84  Temp(Src) 98.8 F (37.1 C) (Oral)  Resp 18  SpO2 97%  LMP 11/25/2014 Physical Exam  Constitutional: She appears well-developed and well-nourished. No distress.  Awake, alert, nontoxic appearance  HENT:  Head: Normocephalic and atraumatic.  Mouth/Throat: Oropharynx is clear and moist. No oropharyngeal exudate.  Eyes: Conjunctivae are normal. No scleral icterus.  Neck: Normal range of motion. Neck supple.  Cardiovascular: Normal rate, regular rhythm and intact distal pulses.   Pulmonary/Chest: Effort normal and breath sounds normal. No respiratory distress. She has no wheezes.  Equal chest expansion  Abdominal: Soft. Bowel sounds are normal. She exhibits no distension and no mass. There is tenderness in the right upper quadrant and epigastric area. There is guarding. There is no rebound.  Tenderness and guarding in the epigastrium and right upper quadrant abdomen otherwise soft  No tenderness in the other quadrants  Musculoskeletal: Normal range of motion. She exhibits no edema.  Neurological: She is alert.  Speech is clear and goal oriented Moves extremities without ataxia  Skin: Skin is warm and dry. She is not diaphoretic.  Psychiatric: She has a normal mood and affect.  Nursing note and vitals reviewed.   ED Course   Procedures (including critical care time) Labs Review Labs Reviewed  COMPREHENSIVE METABOLIC PANEL - Abnormal; Notable for the following:    CO2 21 (*)    All other components within normal limits  URINALYSIS, ROUTINE W REFLEX MICROSCOPIC (NOT [facility]) - Abnormal; Notable for the following:    APPearance CLOUDY (*)    Hgb urine dipstick SMALL (*)    Ketones, ur 15 (*)    Leukocytes, UA LARGE (*)    All other components within normal limits  URINE MICROSCOPIC-ADD ON - Abnormal; Notable for the following:    Squamous Epithelial / LPF MANY (*)    Bacteria, UA FEW (*)    All other components within normal limits  LIPASE, BLOOD  CBC  I-STAT BETA HCG BLOOD, ED ([facility], [facility], [facility] ONLY)  POC OCCULT BLOOD, ED    Imaging Review Mr Brain Wo Contrast  12/09/2014  CLINICAL DATA:  Worsening headache with vision changes. Epilepsy with vagal nerve stimulator. EXAM: MRI HEAD WITHOUT CONTRAST TECHNIQUE: Multiplanar, multiecho pulse sequences of the brain  and surrounding structures were obtained without intravenous contrast. COMPARISON:  08/28/2006 FINDINGS: Calvarium and upper cervical spine: No focal marrow signal abnormality. Orbits: No significant findings. Sinuses and Mastoids: Clear. Brain: Unremarkable. No acute or remote infarct, hemorrhage, hydrocephalus, or mass lesion. No evidence of large vessel occlusion. No white matter lesions. Normal cerebral volume. IMPRESSION: Negative brain MRI.  No explanation for headache. Electronically Signed   By: Marnee Spring M.D.   On: 12/09/2014 15:35   Ct Abdomen Pelvis W Contrast  12/10/2014  CLINICAL DATA:  39 year old female with acute right abdominal and pelvic pain for 1 week with nausea and vomiting. History of cholecystectomy and appendectomy. EXAM: CT ABDOMEN AND PELVIS WITH CONTRAST TECHNIQUE: Multidetector CT imaging of the abdomen and pelvis was performed using the standard protocol following bolus administration of intravenous contrast. CONTRAST:   OMNIPAQUE IOHEXOL 300 MG/ML  SOLN COMPARISON:  04/26/2014 CT FINDINGS: Lower chest:  Unremarkable Hepatobiliary: The liver is unremarkable. The patient is status post cholecystectomy. There is no evidence of biliary dilatation. Pancreas: Unremarkable Spleen: Unremarkable Adrenals/Urinary Tract: A 2 mm left upper pole calculus and 4 mm left lower pole calculus identified. There is no evidence of hydronephrosis or obstructing urinary calculi. The right kidney, adrenal glands and bladder are unremarkable. Stomach/Bowel: Unremarkable. There is no evidence of bowel obstruction or focal bowel wall thickening. Vascular/Lymphatic: Unremarkable. No enlarged lymph nodes or abdominal aortic aneurysm. Reproductive: The uterus and adnexal regions are within normal limits. Other: No free fluid, abscess or pneumoperitoneum. Musculoskeletal: No acute or suspicious abnormalities. Moderate degenerative disc disease and spondylosis at L5-S1 noted. IMPRESSION: No evidence of acute abnormality. Nonobstructing left renal calculi. Electronically Signed   By: Harmon Pier M.D.   On: 12/10/2014 23:31   Dg Abd Acute W/chest  12/10/2014  CLINICAL DATA:  Abdominal pain with nausea and vomiting for 1 week EXAM: DG ABDOMEN ACUTE W/ 1V CHEST COMPARISON:  CT abdomen and pelvis April 26, 2014 FINDINGS: PA chest: Lungs are clear. Heart size and pulmonary vascularity are normal. No adenopathy. There is a stimulator with leads extending to the level of C7 on the left. Supine and upright abdomen: There is moderate stool in the colon. There is no bowel dilatation or air-fluid level suggesting obstruction. No free air. There is a phlebolith in the right pelvis. There are surgical clips in the right upper quadrant. There is a 2 mm calculus in the lower pole of the left kidney. IMPRESSION: Moderate stool in the colon. The bowel gas pattern is normal. No obstruction or free air. Small lower pole left renal calculus. Lungs clear. Electronically  Signed   By: Bretta Bang III M.D.   On: 12/10/2014 20:51   I have personally reviewed and evaluated these images and lab results as part of my medical decision-making.   EKG Interpretation None      MDM   Final diagnoses:  Epigastric abdominal pain  Gastroesophageal reflux disease, esophagitis presence not specified   Jolayne Panther presents with epigastric pain intermittently for the last several weeks worsening in the last 24 hours. Patient also with nausea vomiting. Patient with complaints of hematemesis however more consistent with Mallory-Weiss tear based on its occurrence after several episodes of hard retching.  Patient with history of cholecystectomy and appendectomy. Tender to palpation in the right upper quadrant. She reports outpatient ultrasound of the abdomen at the onset of pain without abnormality at that time. Patient has a long-standing history of GERD however this has not been treated since  she was pregnant 9 years ago.  Symptoms most consistent with peptic ulcer disease.    9 PM Patient without significant relief from morphine. She remains very uncomfortable still. Labs reassuring. Fecal occult negative. No evidence of urinary tract infection. Will obtain CT scan.  12:23 AM Patient resting more comfortably here. No further emesis. CT scan is without acute abnormality. No evidence of dilated common bile duct, diverticulitis, colitis, masses. On repeat exam, abdomen remains soft. No further guarding. No rebound or peritoneal signs. We'll discharge home with treatment for GERD and close follow-up with gastroenterology. She is to also follow-up with her primary care physician. Strict return precautions given patient is afebrile, non-tachycardic and without hypotension.   BP 100/66 mmHg  Pulse 84  Temp(Src) 98.8 F (37.1 C) (Oral)  Resp 18  SpO2 97%  LMP 11/25/2014   Dierdre Forth, PA-C 12/11/14 1610  Lorre Nick, MD 12/14/14 [phone]

## 2014-12-11 MED ORDER — ONDANSETRON 4 MG PO TBDP: ORAL_TABLET | ORAL | Status: DC

## 2014-12-11 MED ORDER — PANTOPRAZOLE SODIUM 40 MG PO TBEC: 40.0000 mg | DELAYED_RELEASE_TABLET | Freq: Every day | ORAL | Status: DC

## 2014-12-11 MED ORDER — OXYCODONE-ACETAMINOPHEN 5-325 MG PO TABS: 1.0000 | ORAL_TABLET | Freq: Four times a day (QID) | ORAL | Status: DC | PRN

## 2014-12-11 NOTE — Discharge Instructions (Signed)
1. Medications: zofran, percocet, protonix usual home medications 2. Treatment: rest, drink plenty of fluids, advance diet slowly 3. Follow Up: Please followup with your primary doctor in 2 days for discussion of your diagnoses and further evaluation after today's visit; please also follow up with gastroenterology with the referral provided; Please return to the ER for persistent vomiting, high fevers or worsening symptoms    Gastroesophageal Reflux Disease, Adult Normally, food travels down the esophagus and stays in the stomach to be digested. However, when a person has gastroesophageal reflux disease (GERD), food and stomach acid move back up into the esophagus. When this happens, the esophagus becomes sore and inflamed. Over time, GERD can create small holes (ulcers) in the lining of the esophagus.  CAUSES This condition is caused by a problem with the muscle between the esophagus and the stomach (lower esophageal sphincter, or LES). Normally, the LES muscle closes after food passes through the esophagus to the stomach. When the LES is weakened or abnormal, it does not close properly, and that allows food and stomach acid to go back up into the esophagus. The LES can be weakened by certain dietary substances, medicines, and medical conditions, including:  Tobacco use.  Pregnancy.  Having a hiatal hernia.  Heavy alcohol use.  Certain foods and beverages, such as coffee, chocolate, onions, and peppermint. RISK FACTORS This condition is more likely to develop in:  People who have an increased body weight.  People who have connective tissue disorders.  People who use NSAID medicines. SYMPTOMS Symptoms of this condition include:  Heartburn.  Difficult or painful swallowing.  The feeling of having a lump in the throat.  Abitter taste in the mouth.  Bad breath.  Having a large amount of saliva.  Having an upset or bloated stomach.  Belching.  Chest pain.  Shortness of  breath or wheezing.  Ongoing (chronic) cough or a night-time cough.  Wearing away of tooth enamel.  Weight loss. Different conditions can cause chest pain. Make sure to see your health care provider if you experience chest pain. DIAGNOSIS Your health care provider will take a medical history and perform a physical exam. To determine if you have mild or severe GERD, your health care provider may also monitor how you respond to treatment. You may also have other tests, including:  An endoscopy toexamine your stomach and esophagus with a small camera.  A test thatmeasures the acidity level in your esophagus.  A test thatmeasures how much pressure is on your esophagus.  A barium swallow or modified barium swallow to show the shape, size, and functioning of your esophagus. TREATMENT The goal of treatment is to help relieve your symptoms and to prevent complications. Treatment for this condition may vary depending on how severe your symptoms are. Your health care provider may recommend:  Changes to your diet.  Medicine.  Surgery. HOME CARE INSTRUCTIONS Diet  Follow a diet as recommended by your health care provider. This may involve avoiding foods and drinks such as:  Coffee and tea (with or without caffeine).  Drinks that containalcohol.  Energy drinks and sports drinks.  Carbonated drinks or sodas.  Chocolate and cocoa.  Peppermint and mint flavorings.  Garlic and onions.  Horseradish.  Spicy and acidic foods, including peppers, chili powder, curry powder, vinegar, hot sauces, and barbecue sauce.  Citrus fruit juices and citrus fruits, such as oranges, lemons, and limes.  Tomato-based foods, such as red sauce, chili, salsa, and pizza with red sauce.  [place]  and fatty foods, such as donuts, french fries, potato chips, and high-fat dressings.  High-fat meats, such as hot dogs and fatty cuts of red and white meats, such as rib eye steak, sausage, ham, and  bacon.  High-fat dairy items, such as whole milk, butter, and cream cheese.  Eat small, frequent meals instead of large meals.  Avoid drinking large amounts of liquid with your meals.  Avoid eating meals during the 2-3 hours before bedtime.  Avoid lying down right after you eat.  Do not exercise right after you eat. General Instructions  Pay attention to any changes in your symptoms.  Take over-the-counter and prescription medicines only as told by your health care provider. Do not take aspirin, ibuprofen, or other NSAIDs unless your health care provider told you to do so.  Do not use any tobacco products, including cigarettes, chewing tobacco, and e-cigarettes. If you need help quitting, ask your health care provider.  Wear loose-fitting clothing. Do not wear anything tight around your waist that causes pressure on your abdomen.  Raise (elevate) the head of your bed 6 inches (15cm).  Try to reduce your stress, such as with yoga or meditation. If you need help reducing stress, ask your health care provider.  If you are overweight, reduce your weight to an amount that is healthy for you. Ask your health care provider for guidance about a safe weight loss goal.  Keep all follow-up visits as told by your health care provider. This is important. SEEK MEDICAL CARE IF:  You have new symptoms.  You have unexplained weight loss.  You have difficulty swallowing, or it hurts to swallow.  You have wheezing or a persistent cough.  Your symptoms do not improve with treatment.  You have a hoarse voice. SEEK IMMEDIATE MEDICAL CARE IF:  You have pain in your arms, neck, jaw, teeth, or back.  You feel sweaty, dizzy, or light-headed.  You have chest pain or shortness of breath.  You vomit and your vomit looks like blood or coffee grounds.  You faint.  Your stool is bloody or black.  You cannot swallow, drink, or eat.   This information is not intended to replace advice  given to you by your health care provider. Make sure you discuss any questions you have with your health care provider.   Document Released: 11/24/2004 Document Revised: 11/05/2014 Document Reviewed: 06/11/2014 Elsevier Interactive Patient Education [employer]

## 2014-12-16 ENCOUNTER — Encounter: Payer: Self-pay | Admitting: Neurology

## 2014-12-16 ENCOUNTER — Ambulatory Visit (INDEPENDENT_AMBULATORY_CARE_PROVIDER_SITE_OTHER): Payer: 59 | Admitting: Neurology

## 2014-12-16 VITALS — BP 139/104 | HR 95 | Ht 62.0 in | Wt 234.8 lb

## 2014-12-16 DIAGNOSIS — G43709 Chronic migraine without aura, not intractable, without status migrainosus: Secondary | ICD-10-CM

## 2014-12-16 MED ORDER — LACOSAMIDE 200 MG PO TABS: 100.0000 mg | ORAL_TABLET | Freq: Two times a day (BID) | ORAL | Status: DC

## 2014-12-16 MED ORDER — VERAPAMIL HCL ER 120 MG PO TBCR
120.0000 mg | EXTENDED_RELEASE_TABLET | Freq: Every day | ORAL | Status: DC
Start: 2014-12-16 — End: 2014-12-31

## 2014-12-16 MED ORDER — TOPIRAMATE ER 100 MG PO CAP24: 100.0000 mg | ORAL_CAPSULE | Freq: Every day | ORAL | Status: DC

## 2014-12-16 NOTE — Progress Notes (Signed)
Sphenocath procedure:  Temperature strip on cheeks:  Before procedure: Left=94 degrees, Right=92 degrees  After procedure: Left=96 degrees, Right= 96 degrees   Lidocaine 2%- 5mL total  Lot: [id number]  Expiration: 12/2017  NDC: [id number]

## 2014-12-16 NOTE — Patient Instructions (Signed)
Change Verapamil from 40mg  three times a day to 120mg  ER at night Change Topiramate IR 75mg  at night to Topiramate ER 100mg  at night

## 2014-12-16 NOTE — Progress Notes (Signed)
    SPHENOCATH PROCEDURE NOTE  History: She has had only one severe migraine in the last week on Sunday. She did not have any other bad headaches that week. She has had 4 spenocaths and sees improvement. Will try an increase the interval to 2 weeks.   HPI: Traci [name] is a 39 y.o. female here as a referral from Dr. Lorin PicketScott for migraines. PMHx juvenile myoclonic epilepsy s/p VNS.   Migraines for many years. She has 15-20 headaches monthly. 8 or more migraines a month. Symptoms are right sided,pounding and throbbing. She can feel her heartbeat in her eye. She has moments of sharp and stabbing pain. Headaches started in middle school. Getting worse. endorses nausea and vomiting, light sensitivity and sound sensitivity. She has tried botox in the past and it didn't help at previous neurologist. Jamal Maesried it twice. She is on Topamax for epilepsy as well as Vimpat. Tried Depakote in the past. Propranolol was also tried. Verapamil was not tried. Fioricet tried for acute magaement. She was on butorphenol nose spray at one point but overused it. Has not tried nortriptyline or amitriptyline. Migraines worsened a year ago. Previous to this her headaches were sporadic. Unclear what triggered the headaches or why they have been worsening. Headaches are worse the week of her menstrual cycle. She doesn't snore at night. She has trouble initiating sleep. Headaches start shortly after she wakes up. She has tried triptans as well as injectable imitrex. She takes ambien at night to help with sleep. No triggers or patterns that she can distinguish. No weakness or neck stiffness or fever/chills or other systemic signs.   Procedure: The patient was placed in the supine position. A temperature strip was added to the cheek area after the area was cleaned with alcohol. The Sphenocath was lubricated with gel, and placed in the right naris. The catheter was inserted above the middle turbinate to the posterior nasal cavity, and then  withdrawn 1 cm. The catheter was deployed and rotated approximately 20 towards the nose. 2-1/2 mL of 2% lidocaine was deployed. The patient was asked to swallow during the injection. The patient demonstrated erythema of the sclera of the eye on this side, and an increase in the cheek temperature was noted from 92 F to 96 F.  This process was repeated on the left side, with similar results. The increase in cheek temperature was documented from 6794 F to 96 F.  The patient tolerated the procedure well. No complications of the procedure were noted. The patient was kept in the supine position for 8 minutes following the procedure. She was given small sips of water after sitting up following the procedure.  Lidocaine 2% NDC [id number] Lidocaine 2%- 5mL total  Lot: [id number]  Expiration: 12/2017  NDC: 30160-109-3263323-486-27  Anson FretAhern, Antonia B

## 2014-12-26 HISTORY — PX: UPPER GASTROINTESTINAL ENDOSCOPY: SHX188

## 2014-12-31 ENCOUNTER — Encounter: Payer: Self-pay | Admitting: Neurology

## 2014-12-31 ENCOUNTER — Ambulatory Visit (INDEPENDENT_AMBULATORY_CARE_PROVIDER_SITE_OTHER): Payer: 59 | Admitting: Neurology

## 2014-12-31 ENCOUNTER — Encounter: Payer: Self-pay | Admitting: *Deleted

## 2014-12-31 VITALS — BP 137/91 | HR 78 | Ht 62.0 in | Wt 235.6 lb

## 2014-12-31 DIAGNOSIS — G43011 Migraine without aura, intractable, with status migrainosus: Secondary | ICD-10-CM

## 2014-12-31 DIAGNOSIS — G43711 Chronic migraine without aura, intractable, with status migrainosus: Secondary | ICD-10-CM | POA: Diagnosis not present

## 2014-12-31 DIAGNOSIS — G43919 Migraine, unspecified, intractable, without status migrainosus: Secondary | ICD-10-CM | POA: Insufficient documentation

## 2014-12-31 MED ORDER — DICLOFENAC POTASSIUM(MIGRAINE) 50 MG PO PACK: 50.0000 mg | PACK | Freq: Once | ORAL | Status: DC | PRN

## 2014-12-31 MED ORDER — VERAPAMIL HCL ER 180 MG PO TBCR: 180.0000 mg | EXTENDED_RELEASE_TABLET | Freq: Every day | ORAL | Status: DC

## 2014-12-31 NOTE — Progress Notes (Signed)
[facility]  Interval update 12/31/2014: Migraines are decreased. She is receiving good benefit from these procedures.   History: Traci [name] is a 39 y.o. female here as a referral from Dr. Lorin PicketScott for migraines. PMHx juvenile myoclonic epilepsy s/p VNS. Migraines were daily and  Since last sphenocath procedure she has only had one migraines. This is an 80% reduction in migraines.   Migraines for many years. She has 15-20 headaches monthly. 8 or more migraines a month. Symptoms are right sided,pounding and throbbing. She can feel her heartbeat in her eye. She has moments of sharp and stabbing pain. Headaches started in middle school. Getting worse. endorses nausea and vomiting, light sensitivity and sound sensitivity. She has tried botox in the past and it didn't help at previous neurologist. Jamal Maesried it twice. She is on Topamax for epilepsy as well as Vimpat. Tried Depakote in the past. Propranolol was also tried. Verapamil was not tried. Fioricet tried for acute magaement. She was on butorphenol nose spray at one point but overused it. Has not tried nortriptyline or amitriptyline. Migraines worsened a year ago. Previous to this her headaches were sporadic. Unclear what triggered the headaches or why they have been worsening. Headaches are worse the week of her menstrual cycle. She doesn't snore at night. She has trouble initiating sleep. Headaches start shortly after she wakes up. She has tried triptans as well as injectable imitrex. She takes ambien at night to help with sleep. No triggers or patterns that she can distinguish. No weakness or neck stiffness or fever/chills or other systemic signs.   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 She has seen previous neurologists and tried multiple medications for both preventative and acute management. Can try Verapamil: Will start Verapamil. Discussed side effects which can include Dizziness, slow heartbeat, constipation, stomach  upset, nausea, headache, and tiredness. If any of these effects persist or worsen or you experience anything else please stop medication and call us. Also gave UpToDate patient handout on the medication. Will try ZOmig or Onzetra atonset of headache, provided sample. If they work for acute management we can order them. I would like an MRI of the brain given worsening headaches but unsure of this can be done with her VNS. Will discuss with Dr. Vickey Hugerohmeier. She tried botox in the past, said it did not help. May reconsider.  Procedure: The patient was placed in the supine position. A temperature strip was added to the cheek area after the area was cleaned with alcohol. The Sphenocath was lubricated with gel, and placed in the right naris. The catheter was inserted above the middle turbinate to the posterior nasal cavity, and then withdrawn 1 cm. The catheter was deployed and rotated approximately 20 towards the nose. 2-1/2 mL of 2% lidocaine was deployed. The patient was asked to swallow during the injection. The patient demonstrated erythema of the sclera of the eye on this side, and an increase in the cheek temperature was noted from 94 F to 96  F.  This process was repeated on the left side, with similar results. The increase in cheek temperature was documented from 8394 F to 96 F.  The patient tolerated the procedure well. No complications of the procedure were noted. The patient was kept in the supine position for 8 minutes following the procedure. She was given small sips of water after sitting up following the procedure.  Lidocaine 2% NDC [id number]  Expiration date: [id number] Lot number: [id number]  Anson Fret

## 2014-12-31 NOTE — Patient Instructions (Signed)
Overall you are doing fairly well but I do want to suggest a few things today:   Remember to drink plenty of fluid, eat healthy meals and do not skip any meals. Try to eat protein with a every meal and eat a healthy snack such as fruit or nuts in between meals. Try to keep a regular sleep-wake schedule and try to exercise daily, particularly in the form of walking, 20-30 minutes a day, if you can.   As far as your medications are concerned, I would like to suggest: Increase Verapamil as discussed  I would like to see you back in 2 weeks, sooner if we need to. Please call us with any interim questions, concerns, problems, updates or refill requests.   Please also call us for any test results so we can go over those with you on the phone.  My clinical assistant and will answer any of your questions and relay your messages to me and also relay most of my messages to you.   Our phone number is [phone]. We also have an after hours call service for urgent matters and there is a physician on-call for urgent questions. For any emergencies you know to call 911 or go to the nearest emergency room

## 2014-12-31 NOTE — Progress Notes (Signed)
[facility] [place] [facility] at [phone]. Received fax confirmation.

## 2014-12-31 NOTE — Progress Notes (Signed)
Lidocaine 2%- 5 mL total Lot: [id number] Expiration: 12/2017 NDC:63323-486-27

## 2015-01-01 DIAGNOSIS — Z0271 Encounter for disability determination: Secondary | ICD-10-CM

## 2015-01-02 DIAGNOSIS — Z0271 Encounter for disability determination: Secondary | ICD-10-CM

## 2015-01-13 ENCOUNTER — Telehealth: Payer: Self-pay

## 2015-01-13 ENCOUNTER — Encounter: Payer: Self-pay | Admitting: Neurology

## 2015-01-13 ENCOUNTER — Ambulatory Visit (INDEPENDENT_AMBULATORY_CARE_PROVIDER_SITE_OTHER): Payer: 59 | Admitting: Neurology

## 2015-01-13 VITALS — BP 110/82 | HR 76 | Resp 20 | Ht 62.0 in | Wt 236.0 lb

## 2015-01-13 DIAGNOSIS — G43A1 Cyclical vomiting, intractable: Secondary | ICD-10-CM | POA: Diagnosis not present

## 2015-01-13 DIAGNOSIS — G40B19 Juvenile myoclonic epilepsy, intractable, without status epilepticus: Secondary | ICD-10-CM

## 2015-01-13 DIAGNOSIS — G44209 Tension-type headache, unspecified, not intractable: Secondary | ICD-10-CM | POA: Insufficient documentation

## 2015-01-13 DIAGNOSIS — Z9689 Presence of other specified functional implants: Secondary | ICD-10-CM | POA: Diagnosis not present

## 2015-01-13 DIAGNOSIS — R1115 Cyclical vomiting syndrome unrelated to migraine: Secondary | ICD-10-CM | POA: Insufficient documentation

## 2015-01-13 MED ORDER — DICLOFENAC POTASSIUM(MIGRAINE) 50 MG PO PACK: PACK | ORAL | Status: DC

## 2015-01-13 NOTE — Progress Notes (Signed)
[facility] PROCEDURE NOTE  Interval update 12/31/2014: Migraines are decreased. She is receiving good benefit from these procedures.   History per Dr Lucia Gaskins, : Traci [name] is a 39 y.o. female here as a referral from Dr. Lorin Picket for migraines. PMHx juvenile myoclonic epilepsy s/p VNS. Migraines were daily and  Since last sphenocath procedure she has only had one migraines. This is an 80% reduction in migraines.   Migraines for many years. She has 15-20 headaches monthly. 8 or more migraines a month. Symptoms are right sided,pounding and throbbing. She can feel her heartbeat in her eye. She has moments of sharp and stabbing pain. Headaches started in middle school. Getting worse. endorses nausea and vomiting, light sensitivity and sound sensitivity. She has tried botox in the past and it didn't help at previous neurologist. Jamal Maes it twice. She is on Topamax for epilepsy as well as Vimpat. Tried Depakote in the past. Propranolol was also tried. Verapamil was not tried. Fioricet tried for acute magaement. She was on butorphenol nose spray at one point but overused it. Has not tried nortriptyline or amitriptyline. Migraines worsened a year ago. Previous to this her headaches were sporadic. Unclear what triggered the headaches or why they have been worsening. Headaches are worse the week of her menstrual cycle. She doesn't snore at night. She has trouble initiating sleep. Headaches start shortly after she wakes up. She has tried triptans as well as injectable imitrex. She takes ambien at night to help with sleep. No triggers or patterns that she can distinguish. No weakness or neck stiffness or fever/chills or other systemic signs.   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 She has seen previous neurologists and tried multiple medications for both preventative and acute management. Can try Verapamil: Will start Verapamil. Discussed side effects which can include Dizziness, slow heartbeat,  constipation, stomach upset, nausea, headache, and tiredness. If any of these effects persist or worsen or you experience anything else please stop medication and call us. Also gave UpToDate patient handout on the medication. Will try ZOmig or Onzetra atonset of headache, provided sample. If they work for acute management we can order them. I would like an MRI of the brain given worsening headaches but unsure of this can be done with her VNS. Will discuss with Dr. Vickey Huger. She tried botox in the past.  , Mrs. Thunder returned from her MRI brain and for the procedure her VNS stimulator had to be switched off. System diagnostic data as of 1400 hrs. show the model to be ASPIRE S 106, serial number is [id number], implanted on 07-04-14, impedance value is 390 ohms. Prior to today's switch off the patient had an output of 0.5 mA, signal frequency was 20 Hz, pulse width was 250 s, signal on time 30 seconds take off time 3 minutes. I will stimulation set for an output current of 0.625 mA air pulse with 2 or 50 s, signal on time 60 seconds. Magnet output 0.750 mA air, pulse width 250 s signal on time 60 seconds. Bumped up to 0.75 ,  amplitude  now be at 0.625 mA and output current will be set to 0.875 mA.  Since the patient's stimulator is new there is no battery life interrogation necessary ( 100%).   We also reviewed the tachycardia detection since implantation in  6th of May 2016_ the patient had 1059 magnet activations for tachycardia.   Interval history from 01-13-15, The patient reports that she had multiple seizures since last being  seen here. In her last visit we had increased her output current signal frequency. The patient reports that her last seizure was just yesterday. In September she had sought help at the hospital emergency room after developing abdominal cramping and vomiting. She was diagnosed with a hiatal hernia and esophageal erosions. The erosions often follow vomiting and wrenching. Be me  today to interrogate her vagal nerve stimulator as well as seeing if any additional changes have to be made.   Procedure:   Interrogation: Total of 1160 magnet activations for tachycardia, 60 autostimulations on average per day since last visit. She used to be at 4% now she is at 14%. She's currently at 0.625 mA for output current. Signal frequency is 20 Hz, pulse width is 250 s, signal on time 30 seconds, signal off time is 3 minutes. Magnet output current 0.87 mA, pulse width 250 s, signal on time 60 seconds. Auto stimulation settings; output current 0.75 mA, pulse width 250 s signal on time 60 seconds.   We adjusted today the output current to 0.75 mA, magnet output current 1.0 mA, auto-stimulation setting current 0.875 milliamperes.  Battery lifetime is full.  Device diagnostic system;  Diagnostic data from 11-15 short normal communication,  current delivery and impedance.  Patent swiped the magnet and the new settings were implemented.  General: The patient is awake, alert and appears not in acute distress. The patient is well groomed. Head: Normocephalic, atraumatic. Neck is supple. Respiratory: Lungs are clear to auscultation. Skin:  Without evidence of edema, or rash Trunk: BMI is  elevated and patient  has normal posture.   Neurologic exam : The patient is awake and alert, oriented to place and time.  Memory subjective   described as intact. There is a normal attention span & concentration ability. Speech is fluent without  Dysarthria, but right after a VNS stimulation the patient is hoarse with some dysphonia, she also has some tightness at the throat and neck, no aphasia. Mood and affect are demure, quiet. Usually her husbands speaks for her.   Cranial nerves: Pupils are equal and briskly reactive to light. Funduscopic exam without   evidence of pallor or edema. Extraocular movements  in vertical and horizontal planes intact and without nystagmus. Visual fields by finger  perimetry are intact. Hearing to finger rub intact.  Facial sensation intact to fine touch. Facial motor strength is symmetric and tongue and uvula move midline.  Motor exam: Normal tone and normal muscle bulk and symmetric normal strength in all extremities.  Sensory:  Fine touch, pinprick and vibration were tested in all extremities. Proprioception is normal.  Coordination: Rapid alternating movements in the fingers/hands is tested and normal. Finger-to-nose maneuver tested and normal without evidence of ataxia, dysmetria or tremor.  Gait and station: Patient walks without assistive device and is able and assisted stool climb up to the exam table. Strength within normal limits. Stance is stable and normal. Steps are unfragmented. Romberg testing is normal.  Deep tendon reflexes: in the upper and lower extremities are symmetric and intact. Babinski maneuver response is  downgoing.   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 She had insufficient response to several medications. VNS implantation decreased seizure frequency from 12  a month to 2 a month,  the seizures feel shorter  with a shorter postictum. The typical post ictal for Mrs. Krawczyk was nausea and headaches. She did not feel necessarily confused. Her husband had witnessed multiple spells before with about a 5-10 minutes total recovery time following a seizure. Now only 2-3 minutes, some without postictum.    Successful titration of VNS , 4 or more functions.   2)The patient's almost chronic headaches has benefited from that the injections with Dr. Lucia Gaskins. She now takes verapamil as a preventative medication for vascular headaches.       Fadi Menter   Rv with Lucia Gaskins next week.

## 2015-01-13 NOTE — Telephone Encounter (Signed)
Pt is in office and reports that the diclofenac powder prescribed by Dr. Lucia GaskinsAhern on 11/2 did not go to the pharmacy. I checked it, and it appears that the medication was not received by the pharmacy. She is wanting to know how and where to pick up this medication.

## 2015-01-13 NOTE — Telephone Encounter (Signed)
I called the patient back.  Got no answer on home line, left message.  Called cell, got no answer, VM has not been set up, unable to leave message.  There are 2 pharmacies on file.  It appears [facility]  We have resent the Rx there, receipt confirmed by pharmacy.  The message I left for the patient relayed this info.  Asked that she call us back if there is a different pharmacy she's like to use instead.

## 2015-01-15 NOTE — Telephone Encounter (Signed)
[facility], Keri/Pharmacist got on the line to advise [employer] needed PA for Vimpat and PA request was faxed to our office 01/05/15, they have not heard back from us regarding PA. Patient is completely out of Vimpat. Husband is there waiting.

## 2015-01-15 NOTE — Telephone Encounter (Signed)
[facility]  He processed a trial claim, and said Vimpat is covered under the plan, and the patient should be able to get it at the pharmacy without any issues.  I called the pharmacy and spoke with pharmacist who reprocessed the Rx and confirmed it did go through without any problem.  Perhaps it was a glitch?  Says they will notify patient when Rx is ready for pick up.

## 2015-01-27 ENCOUNTER — Ambulatory Visit (INDEPENDENT_AMBULATORY_CARE_PROVIDER_SITE_OTHER): Payer: 59 | Admitting: Neurology

## 2015-01-27 ENCOUNTER — Encounter: Payer: Self-pay | Admitting: Neurology

## 2015-01-27 ENCOUNTER — Ambulatory Visit (INDEPENDENT_AMBULATORY_CARE_PROVIDER_SITE_OTHER): Payer: Self-pay | Admitting: Neurology

## 2015-01-27 VITALS — BP 138/92 | HR 87 | Ht 62.0 in | Wt 238.0 lb

## 2015-01-27 VITALS — BP 133/95 | HR 101 | Ht 62.0 in | Wt 238.2 lb

## 2015-01-27 DIAGNOSIS — G40309 Generalized idiopathic epilepsy and epileptic syndromes, not intractable, without status epilepticus: Secondary | ICD-10-CM

## 2015-01-27 DIAGNOSIS — G40409 Other generalized epilepsy and epileptic syndromes, not intractable, without status epilepticus: Secondary | ICD-10-CM | POA: Diagnosis not present

## 2015-01-27 DIAGNOSIS — G43009 Migraine without aura, not intractable, without status migrainosus: Secondary | ICD-10-CM

## 2015-01-27 DIAGNOSIS — Z9689 Presence of other specified functional implants: Secondary | ICD-10-CM | POA: Diagnosis not present

## 2015-01-27 DIAGNOSIS — G40B19 Juvenile myoclonic epilepsy, intractable, without status epilepticus: Secondary | ICD-10-CM | POA: Diagnosis not present

## 2015-01-27 NOTE — Progress Notes (Signed)
[facility]  Interval update 12/31/2014: Migraines are decreased. She is receiving good benefit from these procedures.   History per Dr Lucia GaskinsAhern, : Traci PantherStephanie N [name] is a 39 y.o. female here as a referral from Dr. Lorin PicketScott for migraines. PMHx juvenile myoclonic epilepsy s/p VNS. Migraines were daily and  Since last sphenocath procedure she has only had one migraines. This is an 80% reduction in migraines.   Migraines for many years. She has 15-20 headaches monthly. 8 or more migraines a month. Symptoms are right sided,pounding and throbbing. She can feel her heartbeat in her eye. She has moments of sharp and stabbing pain. Headaches started in middle school. Getting worse. endorses nausea and vomiting, light sensitivity and sound sensitivity. She has tried botox in the past and it didn't help at previous neurologist. Jamal Maesried it twice. She is on Topamax for epilepsy as well as Vimpat. Tried Depakote in the past. Propranolol was also tried. Verapamil was not tried. Fioricet tried for acute magaement. She was on butorphenol nose spray at one point but overused it. Has not tried nortriptyline or amitriptyline. Migraines worsened a year ago. Previous to this her headaches were sporadic. Unclear what triggered the headaches or why they have been worsening. Headaches are worse the week of her menstrual cycle. She doesn't snore at night. She has trouble initiating sleep. Headaches start shortly after she wakes up. She has tried triptans as well as injectable imitrex. She takes ambien at night to help with sleep. No triggers or patterns that she can distinguish. No weakness or neck stiffness or fever/chills or other systemic signs.   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 She has seen previous neurologists and tried multiple medications for both preventative and acute management. Can try Verapamil: Will start Verapamil. Discussed side effects which can include Dizziness, slow heartbeat,  constipation, stomach upset, nausea, headache, and tiredness. If any of these effects persist or worsen or you experience anything else please stop medication and call us. Also gave UpToDate patient handout on the medication. Will try ZOmig or Onzetra atonset of headache, provided sample. If they work for acute management we can order them. I would like an MRI of the brain given worsening headaches but unsure of this can be done with her VNS. Will discuss with Dr. Vickey Hugerohmeier. She tried botox in the past. Traci [name] returned from her MRI brain and for the procedure her VNS stimulator had to be switched off. System diagnostic data as of 1400 hrs. show the model to be ASPIRE S 106, serial number is [id number], implanted on 07-04-14, impedance value is 390 ohms. Prior to today's switch off the patient had an output of 0.5 mA, signal frequency was 20 Hz, pulse width was 250 s, signal on time 30 seconds take off time 3 minutes. I will stimulation set for an output current of 0.625 mA air pulse with 2 or 50 s, signal on time 60 seconds. Magnet output 0.750 mA air, pulse width 250 s signal on time 60 seconds. Bumped up to 0.75 ,  amplitude  now be at 0.625 mA and output current will be set to 0.875 mA.  Since the patient's stimulator is new there is no battery life interrogation necessary ( 100%).  We also reviewed the tachycardia detection since implantation in  6th of May 2016- the patient had 1059 magnet activations for tachycardia.   Interval history from 01-13-15, The patient reports that she had multiple seizures since last being seen here. In  her last visit we had increased her output current signal frequency. The patient reports that her last seizure was just yesterday. In September she had sought help at the hospital emergency room after developing abdominal cramping and vomiting. She was diagnosed with a hiatal hernia and esophageal erosions. The erosions often follow vomiting and wrenching. Be me today to  interrogate her vagal nerve stimulator as well as seeing if any additional changes have to be made.  She had insufficient response to several medications. VNS implantation decreased seizure frequency from 12  a month to 2 a month,  The seizures feel shorter with a shorter postictum. The typical post ictal for Traci [name] was nausea and headaches. She did not feel necessarily confused. Her husband had witnessed multiple spells before with about a 5-10 minutes total recovery time following a seizure. Now only 2-3 minutes, some without postictum. Interrogation 01-13-15 : Total of 1160 magnet activations for tachycardia, 60 autostimulations on average per day since last visit. She used to be at 4% now she is at 14%. She's currently at 0.625 mA for output current. Signal frequency is 20 Hz, pulse width is 250 s, signal on time 30 seconds, signal off time is 3 minutes. Magnet output current 0.87 mA, pulse width 250 s, signal on time 60 seconds. Auto stimulation settings; output current 0.75 mA, pulse width 250 s signal on time 60 seconds.   We adjusted today the output current to 0.75 mA, magnet output current 1.0 mA, auto-stimulation setting current 0.875 milliamperes.   01-27-15 The patient returns today after a flurry of seizures the first one occurring right on 1116 the morning after her last interrogation and titration of her VNS stimulator. She had 3 more seizures since. These seem to be very prolonged protracted over several minutes and characterized in 3 phases which the husband was able to capture on a video. Initially the patient does hypoventilation leading with her eyes wide open no blink reflex is noted, she seems to not move, pulse she maybe tonically extended. The video captured her lying on the kitchen floor. After about 4 minutes 3-4 minutes of hyperventilation she started moaning than her eyes became closed for several minutes then a third phase began when the patient was able to turn over  she was not responding to any verbal stimuli and the video also captured her husband trying to use the magnet on a VNS stimulator which did not change the outcome. The seizure is protracted and characterized by non-convulsive activity. There may have been a convulsions prior to going to the ground which was not captured. She is nauseated, very quiet and looks at of she cried. The patient is frustrated and depressed, demure. The activity on video is not typical for JME .  She felt the changes in settings were not affecting her well being other wise.  Interrogation of the VNS stimulator:  11 - 29 -16 ; The patient had 60 Auto-stimulations per day since her last visit an average of 13% per day for percent circumflex therapy time since the previous office visit from November 15. Magnet stimulation was not documented except for 11- 16-16, there is no documented magnet stimulation otherwise. Patient reported swiping twice daily. No records of this found. Adjusting today the VNS therapy to an output current in milliampere of 1.0, magnet output current to 1.25 mA air out of stimulation to 1.125 mA, battery life does not have to be interrogated in a new device like this one. Her grooming was  completed change for 3 settings. The patient reports feeling the stimulation. She will swipe at home with a new magnet provided by Mr. Girtha Hake, as her swipes were not recorded.     Procedure:  11 - 29 -16 ; The patient had 60 Auto-stimulations per day since her last visit an average of 13% per day for percent circumflex therapy time since the previous office visit from November 15. Magnet stimulation was not documented except for 11- 16-16, there is no documented magnet stimulation otherwise. Patient reported swiping twice daily. No records of this found. Adjusting today the VNS therapy to an output current in milliampere of 1.0, magnet output current to 1.25 mA air out of stimulation to 1.125 mA, battery life does not  have to be interrogated in a new device like this one. Her grooming was completed change for 3 settings. The patient reports feeling the stimulation. She will swipe at home with a new magnet provided by Mr. Girtha Hake, as her swipes were not recorded.      Battery lifetime is full.  Device diagnostic system;  Diagnostic data from 11-15 short normal communication,  current delivery and impedance.  Patent swiped the magnet and the new settings were implemented.  General: The patient is awake, alert and appears not in acute distress. The patient is well groomed. Head: Normocephalic, atraumatic. Neck is supple. Respiratory: Lungs are clear to auscultation. Skin:  Without evidence of edema, or rash Trunk: BMI is  elevated and patient  has normal posture.   Neurologic exam : The patient is awake and alert, oriented to place and time.  Memory subjective   described as intact. There is a normal attention span & concentration ability. Speech is fluent without  Dysarthria, but right after a VNS stimulation the patient is hoarse with some dysphonia, she also has some tightness at the throat and neck, no aphasia. Mood and affect are demure, quiet. Usually her husbands speaks for her.   Cranial nerves: Pupils are equal and briskly reactive to light. Funduscopic exam without   evidence of pallor or edema. Extraocular movements  in vertical and horizontal planes intact and without nystagmus. Visual fields by finger perimetry are intact. Hearing to finger rub intact.  Facial sensation intact to fine touch. Facial motor strength is symmetric and tongue and uvula move midline.  Motor exam: Normal tone and normal muscle bulk and symmetric normal strength in all extremities.    Assessment:  After physical and neurologic examination, review of laboratory studies, imaging, neurophysiology testing and pre-existing records, assessment will be reviewed on the problem list and Plan;  1)Seizures may be a mix of non  epileptic and epileptic seizures in this JME patient.   Successful titration of VNS , 4 or more functions. Increase Milli amperes for auto , magnet and device stimulation, 3 or more settings changed and 4 interrogated.   2)The patient's almost chronic headaches has benefited from that the injections with Dr. Lucia Gaskins. She now takes verapamil as a preventative medication for vascular headaches.  3) the patient reports a flurry of seizures after last adjustment   Device diagnostics were completed the device is running okay as collecting to the magnet of the stimulated. Out of stimulation is felt strongly by the patient. The changes in 3 or more settings are recorded above, battery life is considered full. Over 95%.      Evaluna Utke   Rv with Lucia Gaskins next month .

## 2015-01-27 NOTE — Progress Notes (Signed)
Appointment cancelled

## 2015-02-09 ENCOUNTER — Ambulatory Visit: Payer: 59 | Admitting: Neurology

## 2015-02-09 ENCOUNTER — Telehealth: Payer: Self-pay

## 2015-02-09 NOTE — Telephone Encounter (Signed)
Spoke to Traci [name] regarding today's appt. She reports that she needs her VNS checked but needs to reschedule since her husband can't drive her today. Viviann SpareSteven VNS rep aware. New appt made for her on 12/27 at 10:30. Traci [name] and Viviann SpareSteven aware.

## 2015-02-24 ENCOUNTER — Ambulatory Visit: Payer: 59 | Admitting: Neurology

## 2015-03-23 ENCOUNTER — Ambulatory Visit (INDEPENDENT_AMBULATORY_CARE_PROVIDER_SITE_OTHER): Payer: 59 | Admitting: Neurology

## 2015-03-23 ENCOUNTER — Telehealth: Payer: Self-pay

## 2015-03-23 ENCOUNTER — Encounter: Payer: Self-pay | Admitting: Neurology

## 2015-03-23 VITALS — BP 98/70 | HR 74 | Resp 20 | Ht 62.0 in | Wt 233.0 lb

## 2015-03-23 DIAGNOSIS — R112 Nausea with vomiting, unspecified: Secondary | ICD-10-CM | POA: Insufficient documentation

## 2015-03-23 DIAGNOSIS — G40B09 Juvenile myoclonic epilepsy, not intractable, without status epilepticus: Secondary | ICD-10-CM

## 2015-03-23 DIAGNOSIS — G40309 Generalized idiopathic epilepsy and epileptic syndromes, not intractable, without status epilepticus: Secondary | ICD-10-CM | POA: Diagnosis not present

## 2015-03-23 DIAGNOSIS — G43A0 Cyclical vomiting, not intractable: Secondary | ICD-10-CM | POA: Diagnosis not present

## 2015-03-23 DIAGNOSIS — G40B19 Juvenile myoclonic epilepsy, intractable, without status epilepticus: Secondary | ICD-10-CM | POA: Diagnosis not present

## 2015-03-23 NOTE — Telephone Encounter (Signed)
Can you get this pt scheduled for her chest xrays at [facility]? Or call her and let her know what to do?

## 2015-03-23 NOTE — Addendum Note (Signed)
Addended by: Melvyn Novas on: 03/23/2015 01:58 PM   Modules accepted: Orders

## 2015-03-23 NOTE — Progress Notes (Signed)
[facility]  Interval update 12/31/2014: Migraines are decreased. She is receiving good benefit from these procedures.   History per Dr Lucia GaskinsAhern, : [name] is a 40 y.o. female here as a referral from Dr. Lorin PicketScott for migraines. PMHx juvenile myoclonic epilepsy s/p VNS. Migraines were daily and  Since last sphenocath procedure she has only had one migraines. This is an 80% reduction in migraines.   Migraines for many years. She has 15-20 headaches monthly. 8 or more migraines a month. Symptoms are right sided,pounding and throbbing. She can feel her heartbeat in her eye. She has moments of sharp and stabbing pain. Headaches started in middle school. Getting worse. endorses nausea and vomiting, light sensitivity and sound sensitivity. She has tried botox in the past and it didn't help at previous neurologist. Jamal Maesried it twice. She is on Topamax for epilepsy as well as Vimpat. Tried Depakote in the past. Propranolol was also tried. Verapamil was not tried. Fioricet tried for acute magaement. She was on butorphenol nose spray at one point but overused it. Has not tried nortriptyline or amitriptyline. Migraines worsened a year ago. Previous to this her headaches were sporadic. Unclear what triggered the headaches or why they have been worsening. Headaches are worse the week of her menstrual cycle. She doesn't snore at night. She has trouble initiating sleep. Headaches start shortly after she wakes up. She has tried triptans as well as injectable imitrex. She takes ambien at night to help with sleep. No triggers or patterns that she can distinguish. No weakness or neck stiffness or fever/chills or other systemic signs.   40 year old with chronic migraines, intractable. She has seen previous neurologists and tried multiple medications for both preventative and acute management. Can try Verapamil: Will start Verapamil. Discussed side effects which can include Dizziness, slow heartbeat,  constipation, stomach upset, nausea, headache, and tiredness. If any of these effects persist or worsen or you experience anything else please stop medication and call us. Also gave UpToDate patient handout on the medication. Will try ZOmig or Onzetra atonset of headache, provided sample. If they work for acute management we can order them. I would like an MRI of the brain given worsening headaches but unsure of this can be done with her VNS. Will discuss with Dr. Vickey Hugerohmeier. She tried botox in the past. [name] returned from her MRI brain and for the procedure her VNS stimulator had to be switched off. System diagnostic data as of 1400 hrs. show the model to be ASPIRE S 106, serial number is [id number], implanted on 07-04-14, impedance value is 390 ohms. Prior to today's switch off the patient had an output of 0.5 mA, signal frequency was 20 Hz, pulse width was 250 s, signal on time 30 seconds take off time 3 minutes. I will stimulation set for an output current of 0.625 mA air pulse with 2 or 50 s, signal on time 60 seconds. Magnet output 0.750 mA air, pulse width 250 s signal on time 60 seconds. Bumped up to 0.75 ,  amplitude  now be at 0.625 mA and output current will be set to 0.875 mA.  Since the patient's stimulator is new there is no battery life interrogation necessary ( 100%).  We also reviewed the tachycardia detection since implantation in  6th of May 2016- the patient had 1059 magnet activations for tachycardia.   Interval history from 01-13-15, The patient reports that she had multiple seizures since last being seen here. In  her last visit we had increased her output current signal frequency. The patient reports that her last seizure was just yesterday. In September she had sought help at the hospital emergency room after developing abdominal cramping and vomiting. She was diagnosed with a hiatal hernia and esophageal erosions. The erosions often follow vomiting and wrenching. Be me today to  interrogate her vagal nerve stimulator as well as seeing if any additional changes have to be made.  She had insufficient response to several medications. VNS implantation decreased seizure frequency from 12  a month to 2 a month,  The seizures feel shorter with a shorter postictum. The typical post ictal for [name] was nausea and headaches. She did not feel necessarily confused. Her husband had witnessed multiple spells before with about a 5-10 minutes total recovery time following a seizure. Now only 2-3 minutes, some without postictum. Interrogation 01-13-15 : Total of 1160 magnet activations for tachycardia, 60 autostimulations on average per day since last visit. She used to be at 4% now she is at 14%. She's currently at 0.625 mA for output current. Signal frequency is 20 Hz, pulse width is 250 s, signal on time 30 seconds, signal off time is 3 minutes. Magnet output current 0.87 mA, pulse width 250 s, signal on time 60 seconds. Auto stimulation settings; output current 0.75 mA, pulse width 250 s signal on time 60 seconds.   We adjusted today the output current to 0.75 mA, magnet output current 1.0 mA, auto-stimulation setting current 0.875 milliamperes.   01-27-15 The patient returns today after a flurry of seizures the first one occurring right on 1116 the morning after her last interrogation and titration of her VNS stimulator. She had 3 more seizures since. These seem to be very prolonged protracted over several minutes and characterized in 3 phases which the husband was able to capture on a video. Initially the patient does hypoventilation leading with her eyes wide open no blink reflex is noted, she seems to not move, pulse she maybe tonically extended. The video captured her lying on the kitchen floor. After about 4 minutes 3-4 minutes of hyperventilation she started moaning than her eyes became closed for several minutes then a third phase began when the patient was able to turn over  she was not responding to any verbal stimuli and the video also captured her husband trying to use the magnet on a VNS stimulator which did not change the outcome. The seizure is protracted and characterized by non-convulsive activity. There may have been a convulsions prior to going to the ground which was not captured. She is nauseated, very quiet and looks at of she cried. The patient is frustrated and depressed, demure. The activity on video is not typical for JME .  She felt the changes in settings were not affecting her well being other wise.  Interrogation of the VNS stimulator:  11 - 29 -16 ; The patient had 60 Auto-stimulations per day since her last visit an average of 13% per day for percent circumflex therapy time since the previous office visit from November 15. Magnet stimulation was not documented except for 11- 16-16, there is no documented magnet stimulation otherwise. Patient reported swiping twice daily. No records of this found. Adjusting today the VNS therapy to an output current in milliampere of 1.0, magnet output current to 1.25 mA air out of stimulation to 1.125 mA, battery life does not have to be interrogated in a new device like this one. Her grooming was  completed change for 3 settings. The patient reports feeling the stimulation. She will swipe at home with a new magnet provided by Mr. Girtha Hake, as her swipes were not recorded.     Procedure:  11 - 29 -16 ; The patient had 60 Auto-stimulations per day since her last visit an average of 13% per day for percent circumflex therapy time since the previous office visit from November 15. Magnet stimulation was not documented except for 11- 16-16, there is no documented magnet stimulation otherwise. Patient reported swiping twice daily. No records of this found. Adjusting today the VNS therapy to an output current in milliampere of 1.0, magnet output current to 1.25 mA air out of stimulation to 1.125 mA, battery life does not  have to be interrogated in a new device like this one. Her grooming was completed change for 3 settings. The patient reports feeling the stimulation. She will swipe at home with a new magnet provided by Mr. Girtha Hake, as her swipes were not recorded.      Battery lifetime is full.  Device diagnostic system;  Diagnostic data from 11-15 short normal communication,  current delivery and impedance.  Patent swiped the magnet and the new settings were implemented.  General: The patient is awake, alert and appears not in acute distress. The patient is well groomed. Head: Normocephalic, atraumatic. Neck is supple. Respiratory: Lungs are clear to auscultation. Skin:  Without evidence of edema, or rash Trunk: BMI is  elevated and patient  has normal posture.   Neurologic exam : The patient is awake and alert, oriented to place and time.  Memory subjective   described as intact. There is a normal attention span & concentration ability. Speech is fluent without  Dysarthria, but right after a VNS stimulation the patient is hoarse with some dysphonia, she also has some tightness at the throat and neck, no aphasia. Mood and affect are demure, quiet. Usually her husbands speaks for her.   Cranial nerves: Pupils are equal and briskly reactive to light. Funduscopic exam without   evidence of pallor or edema. Extraocular movements  in vertical and horizontal planes intact and without nystagmus. Visual fields by finger perimetry are intact. Hearing to finger rub intact.  Facial sensation intact to fine touch. Facial motor strength is symmetric and tongue and uvula move midline.  Motor exam: Normal tone and normal muscle bulk and symmetric normal strength in all extremities.    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 .   Successful titration of VNS , 4 or more functions.   Increased Milli amperes for auto , magnet and device stimulation, 3 or more settings changed and 4 interrogated.     03/23/2015: 2:49 PM model IDS and as this is an as prior as our machine 106 serial number is [id number] implanted on 07/04/2014.  Communication intact output current status okay current delivered 1.000 mA , lead impedance is okay impedance value was 3362 and no interference noted. Outer systems frequency is 63 per day, total number of magnet activations are 1668. Incremental increase in magnet output be increased the output current in milliampere from 1.0-1.125 today signal frequency is 20 Hz magnet output current in milliampere from 1.250-1.375 with a pulse width of 250 s  out of stimulation output current will be  From 1.125 increased to 1.250 pulse width 250 s    2)The patient's almost chronic headaches has benefited from that the injections with Dr. Lucia Gaskins. She took a shenoid cath treatment, times 5 improved HA>  She now takes verapamil as a preventative medication for vascular headaches.   3) the patient reports a flurry of seizures after last adjustment . She now has one day about 2 weeks ago when she suffered 3 seizures in one day followed by significant abdominal pain and nausea. She is seeing Dr. Redgie Grayer for GI work up-  she relates her seizure spells to her stomach health and symptoms. There will be more stress-induced complications.  Device diagnostics were completed . 5 interrogations and 3 resettings.  The device is running okay.  auto- stimulation is felt strongly by the patient.  X ray to verify that the VNS is in the right spot and formed a normal scar.  The changes in 3 or more settings are recorded above, battery life is considered full at over 95%.      Nelton Amsden   Rv with Lucia Gaskins next month .

## 2015-03-24 NOTE — Telephone Encounter (Signed)
Faxed orders for X ray to [facility]. Telephone [phone] fax [phone]. Patient can walk in Monday - Friday 7:00 am to 5:00 pm. Spoke to patient she is aware of details.

## 2015-04-21 ENCOUNTER — Encounter: Payer: Self-pay | Admitting: Neurology

## 2015-04-21 ENCOUNTER — Other Ambulatory Visit: Payer: Self-pay

## 2015-04-21 ENCOUNTER — Ambulatory Visit (INDEPENDENT_AMBULATORY_CARE_PROVIDER_SITE_OTHER): Payer: 59 | Admitting: Neurology

## 2015-04-21 VITALS — BP 118/84 | HR 88 | Resp 20 | Ht 62.0 in | Wt 233.0 lb

## 2015-04-21 DIAGNOSIS — Z9689 Presence of other specified functional implants: Secondary | ICD-10-CM | POA: Diagnosis not present

## 2015-04-21 DIAGNOSIS — G40B19 Juvenile myoclonic epilepsy, intractable, without status epilepticus: Secondary | ICD-10-CM

## 2015-04-21 DIAGNOSIS — G40A19 Absence epileptic syndrome, intractable, without status epilepticus: Secondary | ICD-10-CM

## 2015-04-21 NOTE — Progress Notes (Signed)
[facility]  Interval update 12/31/2014: Migraines are decreased. She is receiving good benefit from these procedures.   History per Dr Lucia GaskinsAhern, : Traci [name] is a 40 y.o. female here as a referral from Dr. Lorin PicketScott for migraines. PMHx juvenile myoclonic epilepsy s/p VNS. Migraines were daily and  Since last sphenocath procedure she has only had one migraines. This is an 80% reduction in migraines.   Migraines for many years. She has 15-20 headaches monthly. 8 or more migraines a month. Symptoms are right sided,pounding and throbbing. She can feel her heartbeat in her eye. She has moments of sharp and stabbing pain. Headaches started in middle school. Getting worse. endorses nausea and vomiting, light sensitivity and sound sensitivity. She has tried botox in the past and it didn't help at previous neurologist. Jamal Maesried it twice. She is on Topamax for epilepsy as well as Vimpat. Tried Depakote in the past. Propranolol was also tried. Verapamil was not tried. Fioricet tried for acute magaement. She was on butorphenol nose spray at one point but overused it. Has not tried nortriptyline or amitriptyline. Migraines worsened a year ago. Previous to this her headaches were sporadic. Unclear what triggered the headaches or why they have been worsening. Headaches are worse the week of her menstrual cycle. She doesn't snore at night. She has trouble initiating sleep. Headaches start shortly after she wakes up. She has tried triptans as well as injectable imitrex. She takes ambien at night to help with sleep. No triggers or patterns that she can distinguish. No weakness or neck stiffness or fever/chills or other systemic signs.   40 year old with chronic migraines, intractable. She has seen previous neurologists and tried multiple medications for both preventative and acute management. Can try Verapamil: Will start Verapamil. Discussed side effects which can include Dizziness, slow heartbeat,  constipation, stomach upset, nausea, headache, and tiredness. If any of these effects persist or worsen or you experience anything else please stop medication and call us. Also gave UpToDate patient handout on the medication. Will try ZOmig or Onzetra atonset of headache, provided sample. If they work for acute management we can order them. I would like an MRI of the brain given worsening headaches but unsure of this can be done with her VNS. Will discuss with Dr. Vickey Hugerohmeier. She tried botox in the past. Traci [name] returned from her MRI brain and for the procedure her VNS stimulator had to be switched off. System diagnostic data as of 1400 hrs. show the model to be ASPIRE S 106, serial number is [id number], implanted on 07-04-14, impedance value is 390 ohms. Prior to today's switch off the patient had an output of 0.5 mA, signal frequency was 20 Hz, pulse width was 250 s, signal on time 30 seconds take off time 3 minutes. I will stimulation set for an output current of 0.625 mA air pulse with 2 or 50 s, signal on time 60 seconds. Magnet output 0.750 mA air, pulse width 250 s signal on time 60 seconds. Bumped up to 0.75 ,  amplitude  now be at 0.625 mA and output current will be set to 0.875 mA.  Since the patient's stimulator is new there is no battery life interrogation necessary ( 100%).  We also reviewed the tachycardia detection since implantation in  6th of May 2016- the patient had 1059 magnet activations for tachycardia.   Interval history from 01-13-15, The patient reports that she had multiple seizures since last being seen here. In  her last visit we had increased her output current signal frequency. The patient reports that her last seizure was just yesterday. In September she had sought help at the hospital emergency room after developing abdominal cramping and vomiting. She was diagnosed with a hiatal hernia and esophageal erosions. The erosions often follow vomiting and wrenching. Be me today to  interrogate her vagal nerve stimulator as well as seeing if any additional changes have to be made.  She had insufficient response to several medications. VNS implantation decreased seizure frequency from 12  a month to 2 a month,  The seizures feel shorter with a shorter postictum. The typical post ictal for Traci [name] was nausea and headaches. She did not feel necessarily confused. Her husband had witnessed multiple spells before with about a 5-10 minutes total recovery time following a seizure. Now only 2-3 minutes, some without postictum. Interrogation 01-13-15 : Total of 1160 magnet activations for tachycardia, 60 autostimulations on average per day since last visit. She used to be at 4% now she is at 14%. She's currently at 0.625 mA for output current. Signal frequency is 20 Hz, pulse width is 250 s, signal on time 30 seconds, signal off time is 3 minutes. Magnet output current 0.87 mA, pulse width 250 s, signal on time 60 seconds. Auto stimulation settings; output current 0.75 mA, pulse width 250 s signal on time 60 seconds.   We adjusted today the output current to 0.75 mA, magnet output current 1.0 mA, auto-stimulation setting current 0.875 milliamperes.   01-27-15 The patient returns today after a flurry of seizures the first one occurring right on 1116 the morning after her last interrogation and titration of her VNS stimulator. She had 3 more seizures since. These seem to be very prolonged protracted over several minutes and characterized in 3 phases which the husband was able to capture on a video. Initially the patient does hypoventilation leading with her eyes wide open no blink reflex is noted, she seems to not move, pulse she maybe tonically extended. The video captured her lying on the kitchen floor. After about 4 minutes 3-4 minutes of hyperventilation she started moaning than her eyes became closed for several minutes then a third phase began when the patient was able to turn over  she was not responding to any verbal stimuli and the video also captured her husband trying to use the magnet on a VNS stimulator which did not change the outcome. The seizure is protracted and characterized by non-convulsive activity. There may have been a convulsions prior to going to the ground which was not captured. She is nauseated, very quiet and looks at of she cried. The patient is frustrated and depressed, demure. The activity on video is not typical for JME .  She felt the changes in settings were not affecting her well being other wise.  Interrogation of the VNS stimulator:  11 - 29 -16 ; The patient had 60 Auto-stimulations per day since her last visit an average of 13% per day for percent circumflex therapy time since the previous office visit from November 15. Magnet stimulation was not documented except for 11- 16-16, there is no documented magnet stimulation otherwise. Patient reported swiping twice daily. No records of this found. Adjusting today the VNS therapy to an output current in milliampere of 1.0, magnet output current to 1.25 mA air out of stimulation to 1.125 mA, battery life does not have to be interrogated in a new device like this one. Her grooming was  completed change for 3 settings. The patient reports feeling the stimulation. She will swipe at home with a new magnet provided by Mr. Girtha Hake, as her swipes were not recorded.     Procedure:  11 - 29 -16 ; The patient had 60 Auto-stimulations per day since her last visit an average of 13% per day for percent circumflex therapy time since the previous office visit from November 15. Magnet stimulation was not documented except for 11- 16-16, there is no documented magnet stimulation otherwise. Patient reported swiping twice daily. No records of this found. Adjusting today the VNS therapy to an output current in milliampere of 1.0, magnet output current to 1.25 mA air out of stimulation to 1.125 mA, battery life does not  have to be interrogated in a new device like this one. Her grooming was completed change for 3 settings. The patient reports feeling the stimulation. She will swipe at home with a new magnet provided by Mr. Girtha Hake, as her swipes were not recorded.      Battery lifetime is full.  Device diagnostic system;  Diagnostic data from 11-15 short normal communication,  current delivery and impedance.  Patent swiped the magnet and the new settings were implemented.  General: The patient is awake, alert and appears not in acute distress. The patient is well groomed. Head: Normocephalic, atraumatic. Neck is supple. Respiratory: Lungs are clear to auscultation. Skin:  Without evidence of edema, or rash Trunk: BMI is  elevated and patient  has normal posture.   Neurologic exam : The patient is awake and alert, oriented to place and time.  Memory subjective   described as intact. There is a normal attention span & concentration ability. Speech is fluent without  Dysarthria, but right after a VNS stimulation the patient is hoarse with some dysphonia, she also has some tightness at the throat and neck, no aphasia. Mood and affect are demure, quiet. Usually her husbands speaks for her.   Cranial nerves: Pupils are equal and briskly reactive to light. Funduscopic exam without   evidence of pallor or edema. Extraocular movements  in vertical and horizontal planes intact and without nystagmus. Visual fields by finger perimetry are intact. Hearing to finger rub intact.  Facial sensation intact to fine touch. Facial motor strength is symmetric and tongue and uvula move midline.  Motor exam: Normal tone and normal muscle bulk and symmetric normal strength in all extremities.    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 .   Successful titration of VNS , 4 or more functions.   Increased Milli amperes for auto , magnet and device stimulation, 3 or more settings changed and 4 interrogated.     03/23/2015:  model IDS - 106 , serial number is [id number] implanted on 07/04/2014.  Communication intact output current status okay current delivered 1.000 mA , lead impedance is okay impedance value was 3362 and no interference noted. Outer systems frequency is 63 per day, total number of magnet activations are 1668. Incremental increase in magnet output be increased the output current in milliampere from 1.0-1.125 today signal frequency is 20 Hz magnet output current in milliampere from 1.250-1.375 with a pulse width of 250 s out of stimulation output current will be  From  1.125 increased to 1.250 pulse width 250 s  Interval history from 04/21/2015, patient IDS and as model IDS by her as our 106, implanted on 07/04/2014. The current delivered is 1.125 mA /lead impedance was intact/ impedance value is 3134 ohm. We changed the settings with the interrogation and full battery life indicator. 2.25 m ampere. Output current is 1.25 daily impaired magnet output 1.5 mA output current milliampere  is 1.375 mA at a pulse width of 250 s signal on time 60 seconds   2)The patient's almost chronic headaches has benefited from that the injections with Dr. Lucia Gaskins. She took a shenoid cath treatment, times 5 improved HA! She continues to take  verapamil as a preventative medication for vascular headaches.   3) the patient reports a flurry of seizures after last adjustment .She is now seizure free for a week, 3-5  /month. Had a strep she relates her seizure spells to her stomach health and symptoms. There will be more stress-induced complications.  Device diagnostics were completed . 5 interrogations and 3 resettings.  The device is running okay.  Magnetic  stimulation is felt strongly by the patient, who has hoarseness.  She has not been undergoing the device X rays we ordered, and I will ask her to urgently have these.  X ray to verify that the VNS is in the right spot and formed a normal scar.  The changes in 3 or more settings are recorded above, battery life is considered full at over 95%.      Jesenia Spera   Rv with me VNS in 6-8 weeks. C Traci Gafford.

## 2015-04-22 MED ORDER — ZOLPIDEM TARTRATE ER 12.5 MG PO TBCR: 12.5000 mg | EXTENDED_RELEASE_TABLET | Freq: Every evening | ORAL | Status: DC | PRN

## 2015-04-27 ENCOUNTER — Telehealth: Payer: Self-pay

## 2015-04-27 NOTE — Telephone Encounter (Signed)
Prior authorization for zolpidem tartrate ER 12.5 mg was denied by pt's insurance. I advised pt that out of pocket cost for this medication is about $40 per [email or web address].  Also, per the denial letter, pt needs to have tried zolpidem, zaleplon, or eszopiclone before they will cover the zolpidem ER.  Pt is asking if Dr. Vickey Huger will change her to zolpidem  qhs. Perhaps the prior authorization would be approved for this medication and if not, it will only be around $7 out of pocket.  I advised pt that I would call her back with Dr. Oliva Bustard recommendation.

## 2015-04-28 MED ORDER — ZOLPIDEM TARTRATE 10 MG PO TABS: 10.0000 mg | ORAL_TABLET | Freq: Every evening | ORAL | Status: DC | PRN

## 2015-04-28 NOTE — Telephone Encounter (Signed)
I spoke to pt and advised her that zolpidem  was faxed to her pharmacy. Pt verbalized understanding.

## 2015-06-16 ENCOUNTER — Ambulatory Visit: Payer: 59 | Admitting: Neurology

## 2015-06-23 ENCOUNTER — Other Ambulatory Visit: Payer: Self-pay | Admitting: *Deleted

## 2015-06-23 NOTE — Telephone Encounter (Signed)
Tried pt cell number. VM not set up. Could not LVM. Tried home number. Spoke to pt. Advised we received a refill request from pt pharmacy for vimpat 200mg . She states she takes 100mg  tablet. 2 tablets in the morning and 1.5 tablets in the evening. She verified she does need refill. Advised if we need further clarification we will call. She verbalized understanding.

## 2015-06-24 MED ORDER — LACOSAMIDE 200 MG PO TABS: ORAL_TABLET | ORAL | Status: DC

## 2015-06-24 NOTE — Telephone Encounter (Signed)
Faxed RX for vimpat to [facility]. Received a receipt of confirmation.

## 2015-06-24 NOTE — Telephone Encounter (Signed)
I called [facility]. They report that they have dispensed vimpat 200 mg tablets with the directions to take 1 tablet in the morning and 1/2 tablet in the evening, which is correct per the RX Dr. Vickey Hugerohmeier has written.  I called pt and asked her to look at her vimpat bottle. She confirmed that she is taking vimpat 200 mg tablets, 1 tablet in the morning and 1/2 tablet in the evening. We are all on the same page with regards to this RX. I advised pt that a refill will be [facility] to [facility]. Pt verbalized understanding.

## 2015-07-29 ENCOUNTER — Ambulatory Visit (INDEPENDENT_AMBULATORY_CARE_PROVIDER_SITE_OTHER): Payer: 59 | Admitting: Neurology

## 2015-07-29 ENCOUNTER — Encounter: Payer: Self-pay | Admitting: Neurology

## 2015-07-29 VITALS — BP 122/86 | HR 72 | Resp 20 | Ht 62.0 in | Wt 227.0 lb

## 2015-07-29 DIAGNOSIS — Z9689 Presence of other specified functional implants: Secondary | ICD-10-CM

## 2015-07-29 DIAGNOSIS — G40B19 Juvenile myoclonic epilepsy, intractable, without status epilepticus: Secondary | ICD-10-CM | POA: Diagnosis not present

## 2015-07-29 DIAGNOSIS — G40A19 Absence epileptic syndrome, intractable, without status epilepticus: Secondary | ICD-10-CM | POA: Diagnosis not present

## 2015-07-29 NOTE — Progress Notes (Signed)
[facility]  Interval update 12/31/2014: Migraines are decreased. She is receiving good benefit from these procedures.   History per Dr Lucia GaskinsAhern, : [name] is a 40 y.o. female here as a referral from Dr. Lorin PicketScott for migraines. PMHx juvenile myoclonic epilepsy s/p VNS. Migraines were daily and  Since last sphenocath procedure she has only had one migraines. This is an 80% reduction in migraines.   Migraines for many years. She has 15-20 headaches monthly. 8 or more migraines a month. Symptoms are right sided,pounding and throbbing. She can feel her heartbeat in her eye. She has moments of sharp and stabbing pain. Headaches started in middle school. Getting worse. endorses nausea and vomiting, light sensitivity and sound sensitivity. She has tried botox in the past and it didn't help at previous neurologist. Jamal Maesried it twice. She is on Topamax for epilepsy as well as Vimpat. Tried Depakote in the past. Propranolol was also tried. Verapamil was not tried. Fioricet tried for acute magaement. She was on butorphenol nose spray at one point but overused it. Has not tried nortriptyline or amitriptyline. Migraines worsened a year ago. Previous to this her headaches were sporadic. Unclear what triggered the headaches or why they have been worsening. Headaches are worse the week of her menstrual cycle. She doesn't snore at night. She has trouble initiating sleep. Headaches start shortly after she wakes up. She has tried triptans as well as injectable imitrex. She takes ambien at night to help with sleep. No triggers or patterns that she can distinguish. No weakness or neck stiffness or fever/chills or other systemic signs.   40 year old with chronic migraines, intractable. She has seen previous neurologists and tried multiple medications for both preventative and acute management. Can try Verapamil: Will start Verapamil. Discussed side effects which can include Dizziness, slow heartbeat,  constipation, stomach upset, nausea, headache, and tiredness. If any of these effects persist or worsen or you experience anything else please stop medication and call us. Also gave UpToDate patient handout on the medication. Will try ZOmig or Onzetra atonset of headache, provided sample. If they work for acute management we can order them. I would like an MRI of the brain given worsening headaches but unsure of this can be done with her VNS. Will discuss with Dr. Vickey Hugerohmeier. She tried botox in the past. [name] returned from her MRI brain and for the procedure her VNS stimulator had to be switched off. System diagnostic data as of 1400 hrs. show the model to be ASPIRE S 106, serial number is [id number], implanted on 07-04-14, impedance value is 390 ohms. Prior to today's switch off the patient had an output of 0.5 mA, signal frequency was 20 Hz, pulse width was 250 s, signal on time 30 seconds take off time 3 minutes. I will stimulation set for an output current of 0.625 mA air pulse with 2 or 50 s, signal on time 60 seconds. Magnet output 0.750 mA air, pulse width 250 s signal on time 60 seconds. Bumped up to 0.75 ,  amplitude  now be at 0.625 mA and output current will be set to 0.875 mA.  Since the patient's stimulator is new there is no battery life interrogation necessary ( 100%).  We also reviewed the tachycardia detection since implantation in  6th of May 2016- the patient had 1059 magnet activations for tachycardia.   Interval history from 01-13-15, The patient reports that she had multiple seizures since last being seen here. In  her last visit we had increased her output current signal frequency. The patient reports that her last seizure was just yesterday. In September she had sought help at the hospital emergency room after developing abdominal cramping and vomiting. She was diagnosed with a hiatal hernia and esophageal erosions. The erosions often follow vomiting and wrenching. Be me today to  interrogate her vagal nerve stimulator as well as seeing if any additional changes have to be made.  She had insufficient response to several medications. VNS implantation decreased seizure frequency from 12  a month to 2 a month,  The seizures feel shorter with a shorter postictum. The typical post ictal for Mrs. Kassa was nausea and headaches. She did not feel necessarily confused. Her husband had witnessed multiple spells before with about a 5-10 minutes total recovery time following a seizure. Now only 2-3 minutes, some without postictum. Interrogation 01-13-15 : Total of 1160 magnet activations for tachycardia, 60 autostimulations on average per day since last visit. She used to be at 4% now she is at 14%. She's currently at 0.625 mA for output current. Signal frequency is 20 Hz, pulse width is 250 s, signal on time 30 seconds, signal off time is 3 minutes. Magnet output current 0.87 mA, pulse width 250 s, signal on time 60 seconds. Auto stimulation settings; output current 0.75 mA, pulse width 250 s signal on time 60 seconds.   We adjusted today the output current to 0.75 mA, magnet output current 1.0 mA, auto-stimulation setting current 0.875 milliamperes.   01-27-15 The patient returns today after a flurry of seizures the first one occurring right on 1116 the morning after her last interrogation and titration of her VNS stimulator. She had 3 more seizures since. These seem to be very prolonged protracted over several minutes and characterized in 3 phases which the husband was able to capture on a video. Initially the patient does hypoventilation leading with her eyes wide open no blink reflex is noted, she seems to not move, pulse she maybe tonically extended. The video captured her lying on the kitchen floor. After about 4 minutes 3-4 minutes of hyperventilation she started moaning than her eyes became closed for several minutes then a third phase began when the patient was able to turn over  she was not responding to any verbal stimuli and the video also captured her husband trying to use the magnet on a VNS stimulator which did not change the outcome. The seizure is protracted and characterized by non-convulsive activity. There may have been a convulsions prior to going to the ground which was not captured.She is nauseated, very quiet and looks at of she cried. The patient is frustrated and depressed, demure. The activity on video is not typical for JME .  She felt the changes in settings were not affecting her well being other wise.  Interrogation of the VNS stimulator: The patient had 60 Auto-stimulations per day since her last visit an average of 13% per day for percent circumflex therapy time since the previous office visit from November 15. Magnet stimulation was not documented except for 11- 16-16, there is no documented magnet stimulation otherwise. Patient reported swiping twice daily. No records of this found. Adjusting today the VNS therapy to an output current in milliampere of 1.0, magnet output current to 1.25 mA air out of stimulation to 1.125 mA, battery life does not have to be interrogated in a new device like this one. Her grooming was completed change for 3 settings. The patient  reports feeling the stimulation. She will swipe at home with a new magnet provided by Mr. Girtha HakeBonanza, as her swipes were not recorded.    Procedure:  11 - 29 -16 ;  Magnet stimulation was not documented except for 11- 16-16, there is no documented magnet stimulation otherwise. Patient reported swiping twice daily. No records of this found. Adjusting today the VNS therapy to an output current in milliampere of 1.0, magnet output current to 1.25 mA air out of stimulation to 1.125 mA, battery life does not have to be interrogated in a new device like this one. Her grooming was completed change for 3 settings. The patient reports feeling the stimulation. She will swipe at home with a new magnet provided  by Mr. Girtha HakeBonanza, as her swipes were not recorded.   Interval procedure from 07/29/2015, VNS stimulator interrogation. Patient reported 3 seizures since last intervention, feels that magnet shortened the events. Diagnostic information is obtained impedance value was obtained at 04/28/2005 ohm. Output current in milliampere is 1.25, signal frequency and hertz 20, pulse width 250 s, signal on time 30 seconds signal off time 3 minutes. The output current was adjusted today to 1.375 mA. Output current for the magnet will be 1.625 mA output current for the outer stimulator will be increased to 1.375 mA she has 4-6 out of stimulation events per day as slight decrease from 60 and last visits notes. The patient has occasional dysphonia noted during the stimulation phase of the vagal nerve stimulator.. She swiped the  magnet today - the VNS stimulator is activated. Aura before seizures. This helps to abort a seizure. JME.        Battery lifetime is full.  Device diagnostic system;  Diagnostic data from 11-15 short normal communication,  current delivery and impedance.  Patent swiped the magnet and the new settings were implemented.  General: The patient is awake, alert and appears not in acute distress. The patient is well groomed. Head: Normocephalic, atraumatic. Neck is supple. Respiratory: Lungs are clear to auscultation. Skin:  Without evidence of edema, or rash Trunk: BMI is elevated and patient  has normal posture.   Neurologic exam : The patient is awake and alert, oriented to place and time.  Memory subjective   described as intact. There is a normal attention span & concentration ability. Speech is fluent without  Dysarthria, but right after a VNS stimulation the patient is hoarse with some dysphonia, she also has some tightness at the throat and neck, no aphasia. Mood and affect are demure, quiet. Usually her husbands speaks for her.   Cranial nerves: Pupils are equal and briskly reactive  to light. Funduscopic exam without   evidence of pallor or edema. Extraocular movements  in vertical and horizontal planes intact and without nystagmus. Visual fields by finger perimetry are intact. Hearing to finger rub intact.  Facial sensation intact to fine touch. Facial motor strength is symmetric and tongue and uvula move midline.  Motor exam: Normal tone and normal muscle bulk and symmetric normal strength in all extremities. No myoclonus seen here, but reported to affect her in AM , right after waking up.     Assessment:  After physical and neurologic examination, review of laboratory studies, imaging, neurophysiology testing and pre-existing records, assessment will be reviewed on the problem list and Plan;  1)Seizures may be a mix of non epileptic and epileptic seizures in this JME patient with a lot of stress, IBS and Migraines. .   Successful titration of VNS ,  4 or more functions today    Increased Milli amperes for auto , magnet and device stimulation Mampere , 3 or more settings changed and 4 interrogated.      model IDS - 106 , serial number is [id number] implanted on 07/04/2014.  Communication intact output current status okay current delivered 1.000 mA , lead impedance is okay impedance value was 3362 and no interference noted. Outer systems frequency is 63 per day, total number of magnet activations are 1668. Incremental increase in magnet output be increased the output current in milliampere from 1.0-1.125 today signal frequency is 20 Hz magnet output current in milliampere from 1.250-1.375 with a pulse width of 250 s out of stimulation output current will be 1.125 increased to 1.250 pulse width 250 s  model IDS by her as our 106, implanted on 07/04/2014. The current delivered is 1.125 mA /lead impedance was intact/ impedance value is 3134 ohm. We changed the settings with the interrogation and full battery life indicator. 2.25 m ampere. Output current is 1.25 daily impaired  magnet output 1.5 mA output current milliampere  is 1.375 mA at a pulse width of 250 s signal on time 60 seconds   2)The patient's almost chronic headaches has benefited from that the injections with Dr. Lucia Gaskins. She took a shenoid cath treatment, times 5 improved HA! She continues to take  verapamil as a preventative medication for vascular headaches.   3) the patient reports a flurry of seizures after last adjustment .She is now seizure free for a week, 3-5  /month. Had a strep she relates her seizure spells to her stomach health and symptoms. There will be more stress-induced complications.  Device diagnostics were completed . full interrogations and 4 resettings.  The device is running okay. Magnetic  stimulation is felt strongly by the patient, who has hoarseness.  She has not been undergoing the device X rays we ordered, and I will ask her to urgently have these.  X ray to verify that the VNS is in the right spot and formed a normal scar. The changes in 3 or more settings are recorded above, battery life is considered full at over 95%.   This will conclude our visit today, the patient is supposed to follow-up with me again in 8-12 weeks. It is planned to further increase her output current.     Della Homan   Rv with me VNS in 12-16 weeks.

## 2015-10-15 ENCOUNTER — Ambulatory Visit (INDEPENDENT_AMBULATORY_CARE_PROVIDER_SITE_OTHER): Payer: 59 | Admitting: Neurology

## 2015-10-15 ENCOUNTER — Encounter: Payer: Self-pay | Admitting: Neurology

## 2015-10-15 DIAGNOSIS — G40B19 Juvenile myoclonic epilepsy, intractable, without status epilepticus: Secondary | ICD-10-CM

## 2015-10-15 DIAGNOSIS — G40A19 Absence epileptic syndrome, intractable, without status epilepticus: Secondary | ICD-10-CM | POA: Diagnosis not present

## 2015-10-15 DIAGNOSIS — Z462 Encounter for fitting and adjustment of other devices related to nervous system and special senses: Secondary | ICD-10-CM

## 2015-10-15 DIAGNOSIS — G43711 Chronic migraine without aura, intractable, with status migrainosus: Secondary | ICD-10-CM | POA: Diagnosis not present

## 2015-10-15 DIAGNOSIS — G40309 Generalized idiopathic epilepsy and epileptic syndromes, not intractable, without status epilepticus: Secondary | ICD-10-CM

## 2015-10-15 NOTE — Progress Notes (Signed)
Interval history from 10/15/2015, Lead impedance is intact impedance is at 3080 ohms, current delivered is 1.375 mA air magnet activations 2362, 51 of systems per day on average output current will be increased today from 1.375 mA air to 1.5, magnet output current from 1.625-1.75, O2 stimulation from 1.50-1.625, cardiology detection is on heartbeat detection sensitivity level III, threshold for auto- stimulation is a heart rate change of 40% or more. She repsonded with hoarseness to the increase in settings.   40 year old with chronic migraines, intractable. She has seen previous neurologists and tried multiple medications for both preventative and acute management. Can try Verapamil: Will start Verapamil. Discussed side effects which can include Dizziness, slow heartbeat, constipation, stomach upset, nausea, headache, and tiredness. If any of these effects persist or worsen or you experience anything else please stop medication and call us. Also gave UpToDate patient handout on the medication. Will try ZOmig or Onzetra atonset of headache, provided sample. If they work for acute management we can order them. I would like an MRI of the brain given worsening headaches but unsure of this can be done with her VNS. Will discuss with Dr. Brett Fairy. She tried botox in the past.  Mrs. Stlouis returned from her MRI brain and for the procedure her VNS stimulator had to be switched off.   System diagnostic data as of 1400 hrs. show the model to be ASPIRE S 106, serial number is [id number], implanted on 07-04-14, impedance value is 390 ohms. Prior to today's switch off the patient had an output of 0.5 mA, signal frequency was 20 Hz, pulse width was 250 s, signal on time 30 seconds take off time 3 minutes. I will stimulation set for an output current of 0.625 mA air pulse with 2 or 50 s, signal on time 60 seconds. Magnet output 0.750 mA air, pulse width 250 s signal on time 60 seconds. Bumped up to 0.75 ,  amplitude   now be at 0.625 mA and output current will be set to 0.875 mA.  Since the patient's stimulator is new there is no battery life interrogation necessary ( 100%).  We also reviewed the tachycardia detection since implantation in  38th of May 2016- the patient had 1059 magnet activations for tachycardia.   Interval history from 38-15-16, The patient reports that she had multiple seizures since last being seen here. In her last visit we had increased her output current signal frequency. The patient reports that her last seizure was just yesterday. In September she had sought help at the hospital emergency room after developing abdominal cramping and vomiting. She was diagnosed with a hiatal hernia and esophageal erosions. The erosions often follow vomiting and wrenching. Be me today to interrogate her vagal nerve stimulator as well as seeing if any additional changes have to be made.  She had insufficient response to several medications. VNS implantation decreased seizure frequency from 12  a month to 2 a month,  The seizures feel shorter with a shorter postictum. The typical post ictal for Mrs. Scarfone was nausea and headaches. She did not feel necessarily confused. Her husband had witnessed multiple spells before with about a 5-10 minutes total recovery time following a seizure. Now only 2-3 minutes, some without postictum.  Interrogation 01-13-15 : Total of 1160 magnet activations for tachycardia, 60 autostimulations on average per day since last visit. She used to be at 4% now she is at 14%. She's currently at 0.625 mA for output current. Signal  frequency is 20 Hz, pulse width is 250 s, signal on time 30 seconds, signal off time is 3 minutes. Magnet output current 0.87 mA, pulse width 250 s, signal on time 60 seconds. Auto stimulation settings; output current 0.75 mA, pulse width 250 s signal on time 60 seconds. We adjusted today the output current to 0.75 mA, magnet output current 1.0 mA,  auto-stimulation setting current 0.875 milliamperes.   Procedure:  11 - 29 -16 ;  Magnet stimulation was not documented except for 11- 16-16, there is no documented magnet stimulation otherwise. Patient reported swiping twice daily. No records of this found. Adjusting today the VNS therapy to an output current in milliampere of 1.0, magnet output current to 1.25 mA air out of stimulation to 1.125 mA, battery life does not have to be interrogated in a new device like this one. Her grooming was completed change for 3 settings. The patient reports feeling the stimulation. She will swipe at home with a new magnet provided by Mr. Caren Hazy, as her swipes were not recorded.   Interval procedure from 38/31/2017, VNS stimulator interrogation. Patient reported 3 seizures since last intervention, feels that magnet shortened the events. Diagnostic information is obtained impedance value was obtained at 04/28/2005 ohm. Output current in milliampere is 1.25, signal frequency and hertz 20, pulse width 250 s, signal on time 30 seconds signal off time 3 minutes. The output current was adjusted today to 1.375 mA. Output current for the magnet will be 1.625 mA output current for the outer stimulator will be increased to 1.375 mA she has 4-6 out of stimulation events per day as slight decrease from 60 and last visits notes. The patient has occasional dysphonia noted during the stimulation phase of the vagal nerve stimulator.. She swiped the  magnet today - the VNS stimulator is activated. Aura before seizures. This helps to abort a seizure. JME.        Battery lifetime is full.  Device diagnostic system;  Diagnostic data from 11-15 short normal communication,  current delivery and impedance.  Patent swiped the magnet and the new settings were implemented.  General: The patient is awake, alert and appears not in acute distress. The patient is well groomed. Head: Normocephalic, atraumatic. Neck is supple.  Respiratory: Lungs are clear to auscultation. Skin:  Without evidence of edema, or rash Trunk: BMI is elevated and patient  has normal posture.   Neurologic exam : The patient is awake and alert, oriented to place and time.  Memory subjective   described as intact. There is a normal attention span & concentration ability. Speech is fluent without  Dysarthria, but right after a VNS stimulation the patient is hoarse with some dysphonia, she also has some tightness at the throat and neck, no aphasia. Mood and affect are demure, quiet. Usually her husbands speaks for her.   Cranial nerves: Pupils are equal and briskly reactive to light. Funduscopic exam without   evidence of pallor or edema. Extraocular movements  in vertical and horizontal planes intact and without nystagmus. Visual fields by finger perimetry are intact. Hearing to finger rub intact.  Facial sensation intact to fine touch. Facial motor strength is symmetric and tongue and uvula move midline.  Motor exam: Normal tone and normal muscle bulk and symmetric normal strength in all extremities. No myoclonus seen here, but reported to affect her in AM , right after waking up.     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 .   Successful titration of VNS , 4 or more functions today    Increased Milli amperes for auto , magnet and device stimulation Mampere , 3 or more settings changed and 4 interrogated.   2)The patient's almost chronic headaches has benefited from that the injections with Dr. Jaynee Eagles. She took a shenoid cath treatment, times 5 improved HA! She continues to take verapamil as a preventative medication for vascular headaches.   3) the patient  reports a flurry of seizures after last adjustment .She is now seizure free for a week, 3  /month. Had a strep she relates her seizure spells to her stomach health and symptoms. There will be more stress-induced complications. 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   4)Device diagnostics were completed . Again, we met for a full interrogations and 4 resettings.  The device is running okay. Magnetic stimulation is felt strongly by the patient, who has hoarseness.  She has finally again not undergone the device X rays we ordered, but  the cable must be in the correct place, otherwise she wouldn't respond to resetting. .  The changes in 3 or more settings are recorded above, battery life is considered full at over 95%.  This will conclude our visit today, the patient is supposed to follow-up with me again in 24 weeks. It is planned to further increase her output current.    Kristyn Obyrne, MD   Rv with me:  For VNS in 24  Weeks/ 6 month .

## 2016-07-05 IMAGING — MR MR HEAD W/O CM
9 of 11 series · 36 of 48 positions shown · non-contrast
Comparison: 08/28/2006

CLINICAL DATA: Worsening headache with vision changes. Epilepsy
with vagal nerve stimulator.

EXAM:
MRI HEAD WITHOUT CONTRAST
TECHNIQUE: Multiplanar, multiecho pulse sequences of the brain and surrounding
structures were obtained without intravenous contrast.

[Series 2: T1 · sagittal · 5.0mm · 0.47mm/px · 3 of 23 slices shown]
[im 1/23]
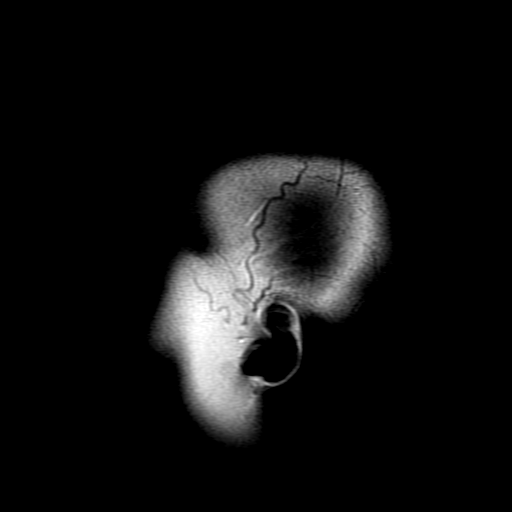
[im 12/23]
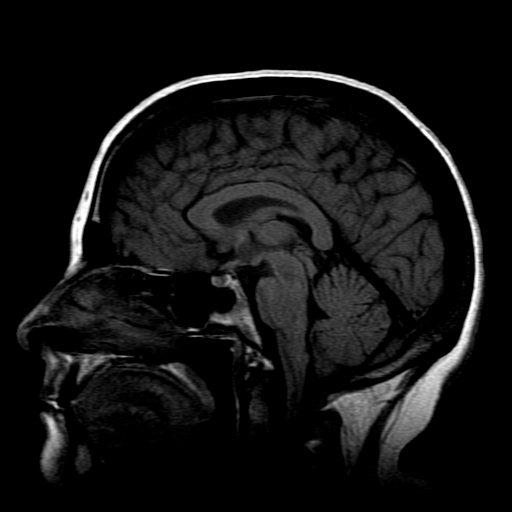
[im 23/23]
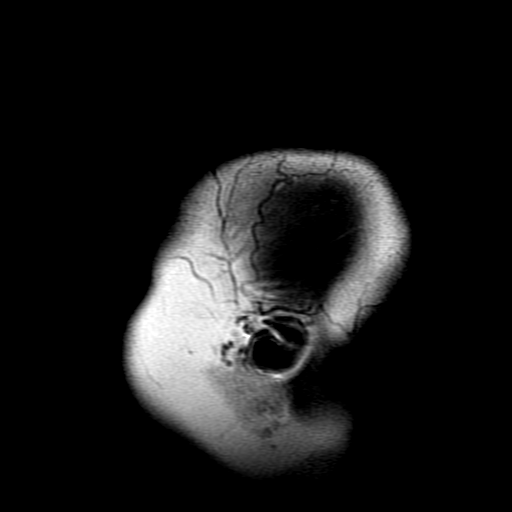

[Series 3: DWI · axial · 3.0mm · 1.09mm/px · z∈[-91,+41]mm · 9 of 90 slices shown (1 of 4)]
[im 1/90]
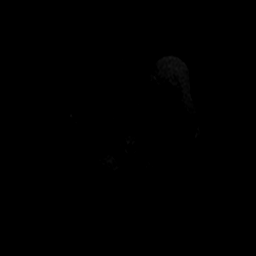
[im 12/90]
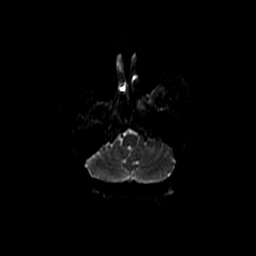
[im 23/90]
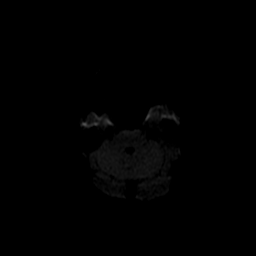
[im 34/90]
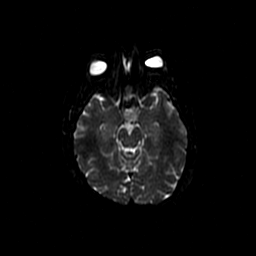
[im 45/90]
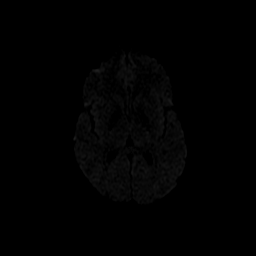
[im 56/90]
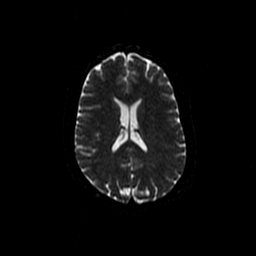
[im 67/90]
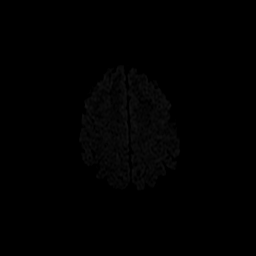
[im 78/90]
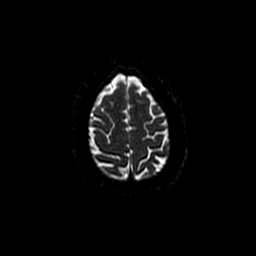
[im 90/90]
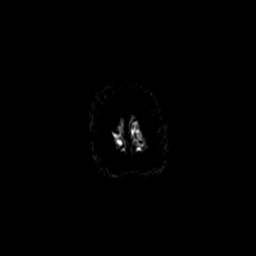

[Series 4: T2 · axial · 5.0mm · 0.43mm/px · z∈[-93,+39]mm · 2 of 23 slices shown (1 of 3)]
[im 1/23]
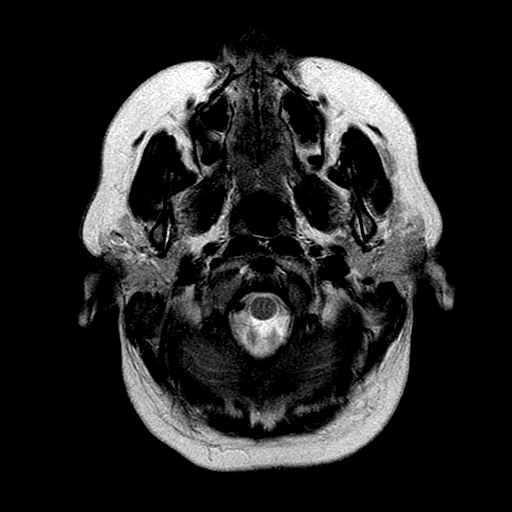
[im 23/23]
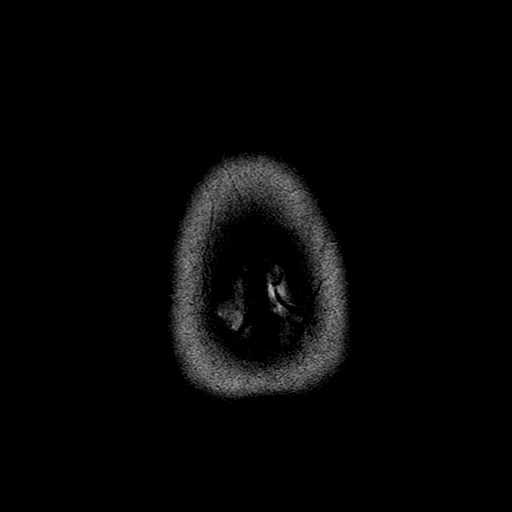

[Series 5: DWI · coronal · 5.0mm · 1.09mm/px · 7 of 64 slices shown (2 of 4)]
[im 1/64]
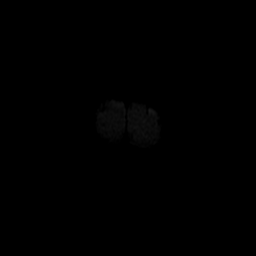
[im 11/64]
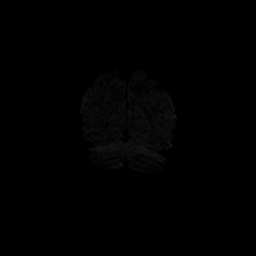
[im 22/64]
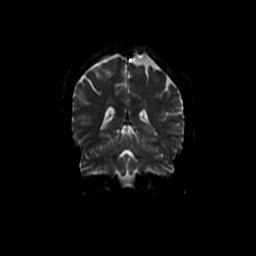
[im 32/64]
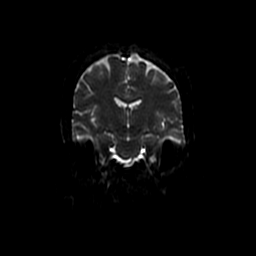
[im 43/64]
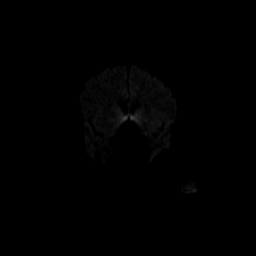
[im 53/64]
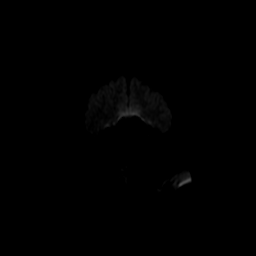
[im 64/64]
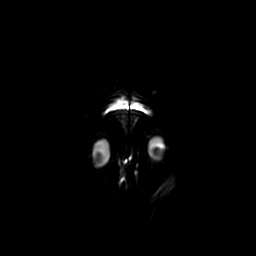

[Series 6: FLAIR · axial · 5.0mm · 0.43mm/px · z∈[-93,+39]mm · 2 of 23 slices shown]
[im 1/23]
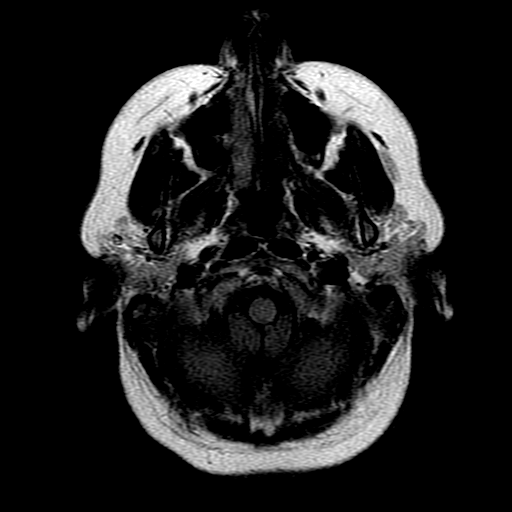
[im 23/23]
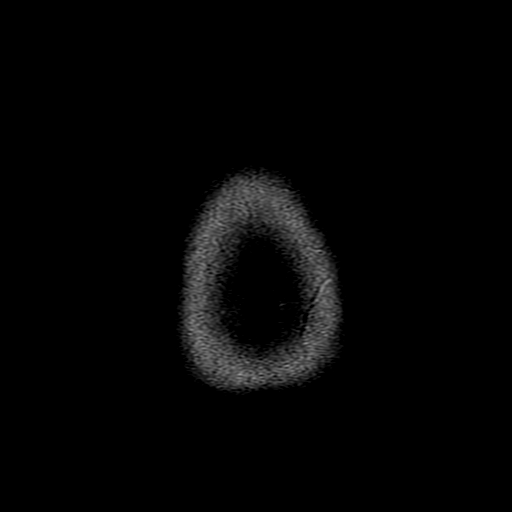

[Series 9: T2 · coronal · 5.0mm · 0.39mm/px · 3 of 25 slices shown (2 of 3)]
[im 1/25]
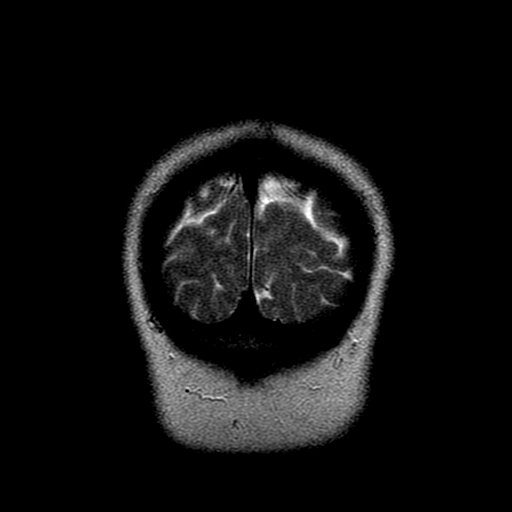
[im 13/25]
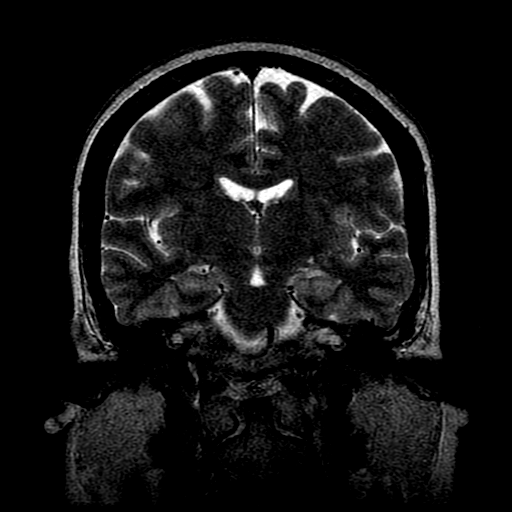
[im 25/25]
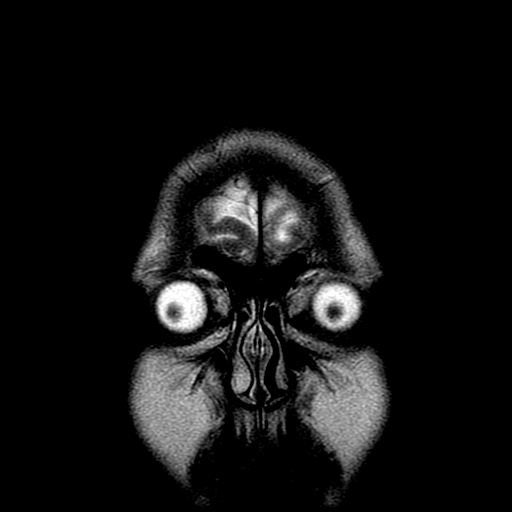

[Series 10: T2 · coronal · 3.0mm · 0.35mm/px · 2 of 24 slices shown (3 of 3)]
[im 1/24]
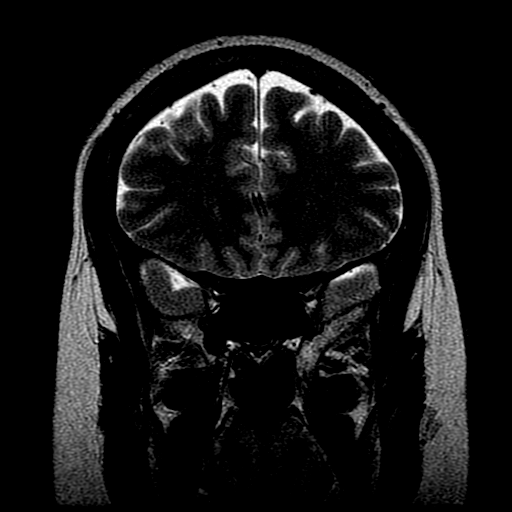
[im 24/24]
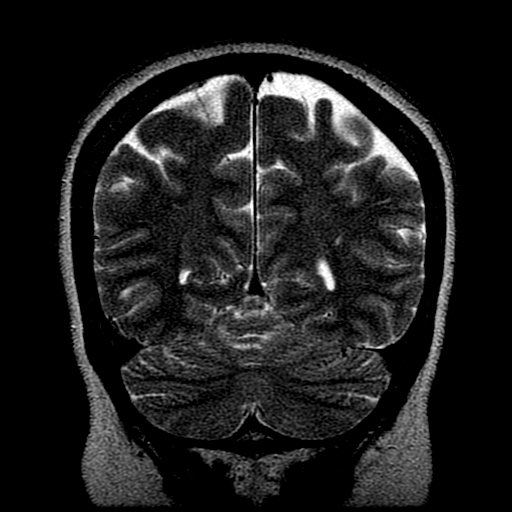

[Series 300: DWI · axial · 3.0mm · 1.09mm/px · z∈[-91,+41]mm · 5 of 45 slices shown (3 of 4)]
[im 1/45]
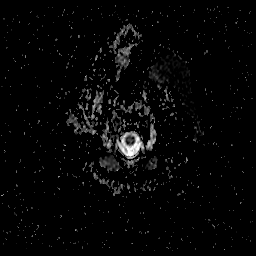
[im 12/45]
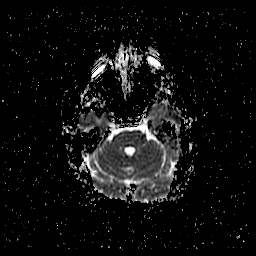
[im 23/45]
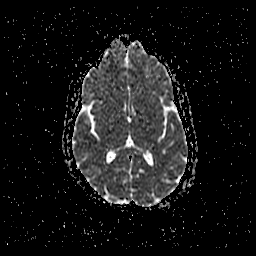
[im 34/45]
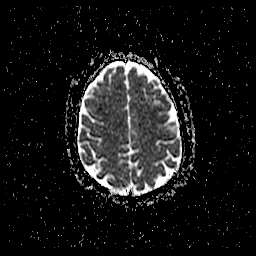
[im 45/45]
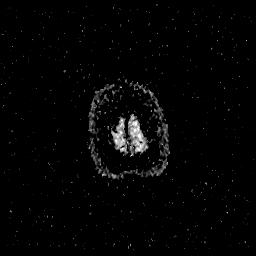

[Series 500: DWI · coronal · 5.0mm · 1.09mm/px · 3 of 32 slices shown (4 of 4)]
[im 1/32]
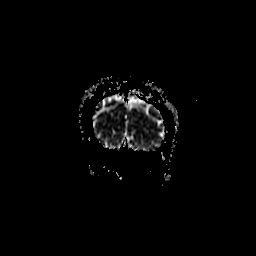
[im 16/32]
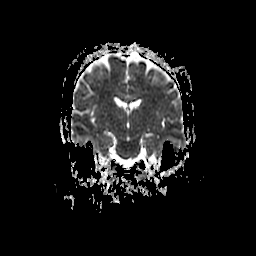
[im 32/32]
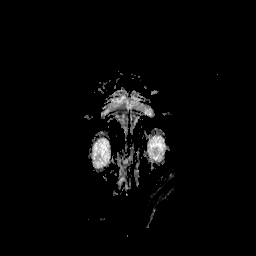

[36 of 48 positions shown; findings below may reference images not displayed]

FINDINGS: Calvarium and upper cervical spine: No focal marrow signal
abnormality.

Orbits: No significant findings.

Sinuses and Mastoids: Clear.

Brain: Unremarkable. No acute or remote infarct, hemorrhage,
hydrocephalus, or mass lesion. No evidence of large vessel
occlusion. No white matter lesions. Normal cerebral volume.
IMPRESSION: Negative brain MRI.  No explanation for headache.

## 2016-07-06 IMAGING — CR DG ABDOMEN ACUTE W/ 1V CHEST
3 series · 3 of 3 positions shown · non-contrast
Comparison: CT abdomen and pelvis April 26, 2014

CLINICAL DATA: Abdominal pain with nausea and vomiting for 1 week

EXAM:
DG ABDOMEN ACUTE W/ 1V CHEST

[chest pa]
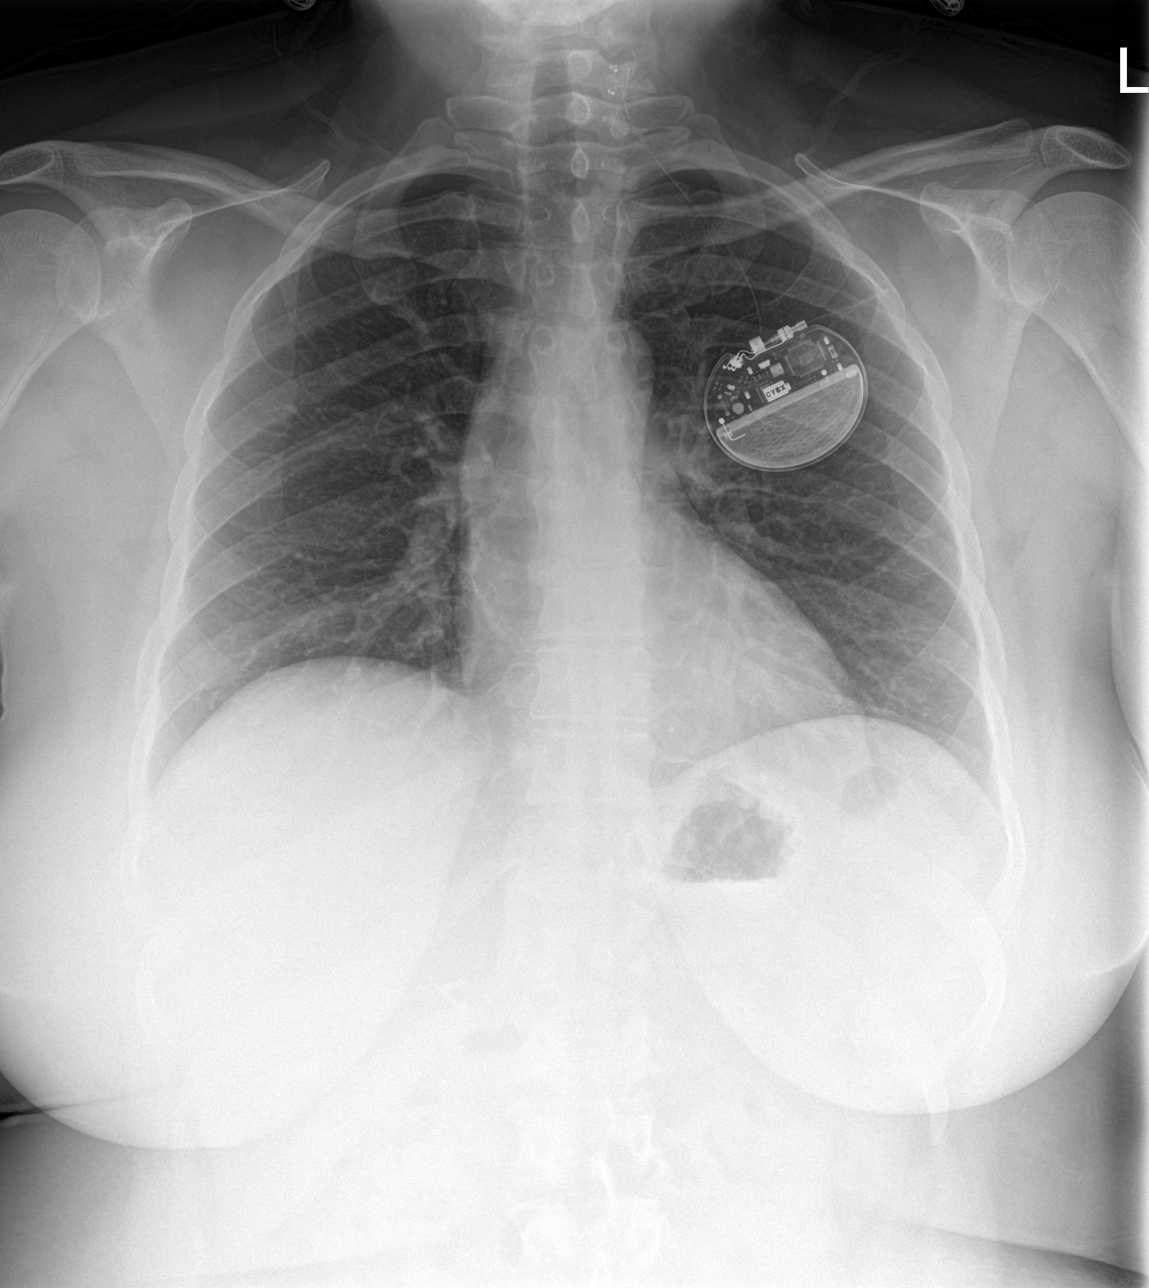

[abdomen erect]
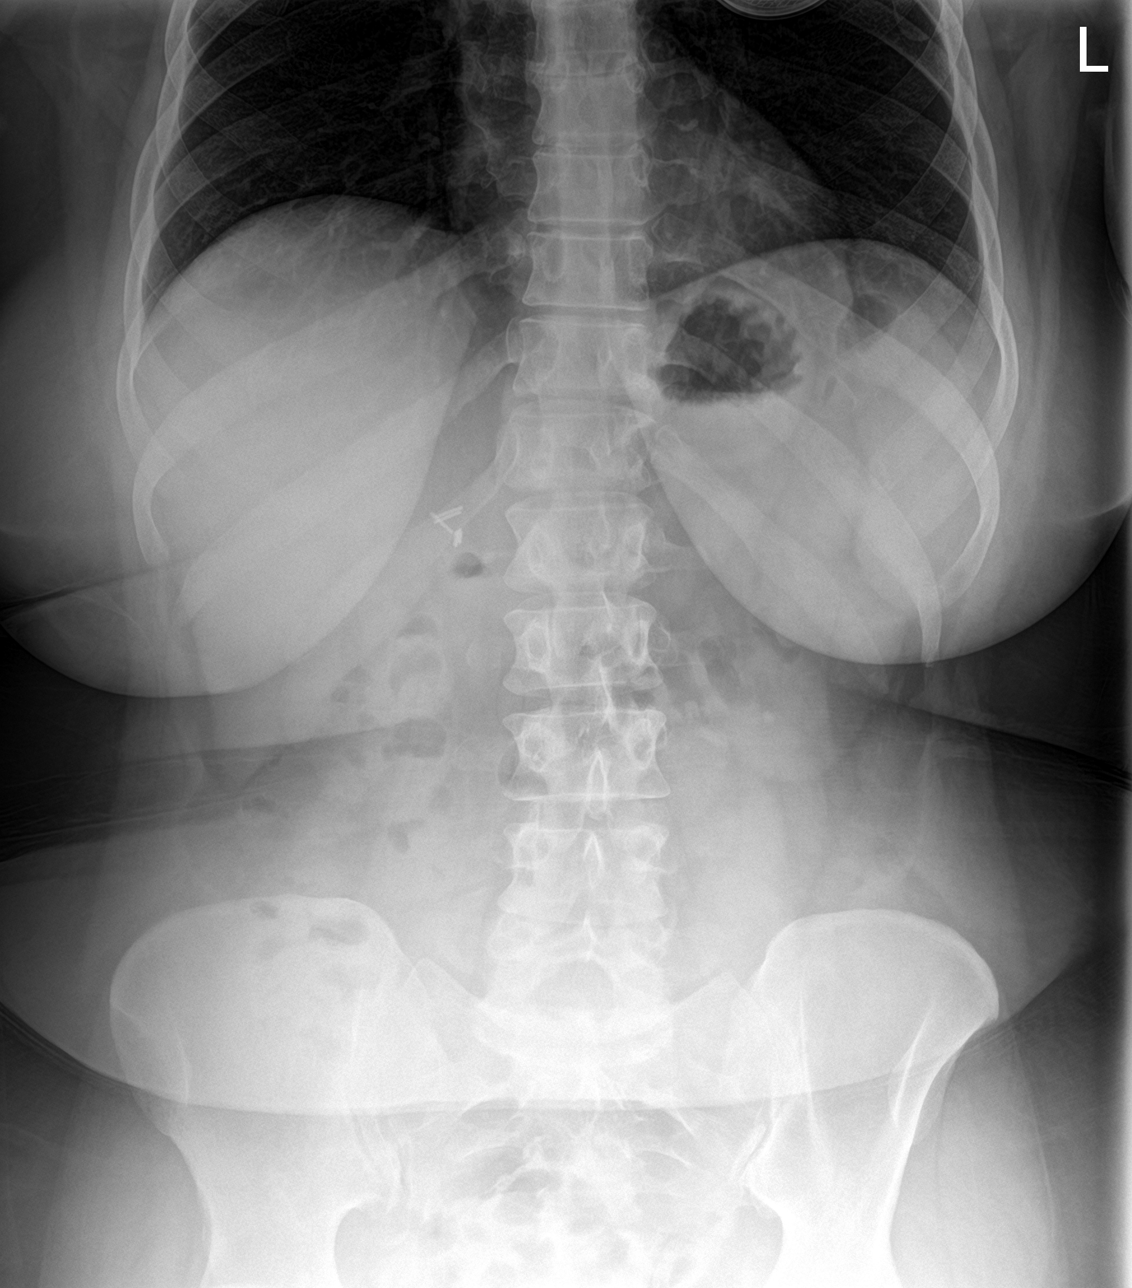

[abdomen supine]
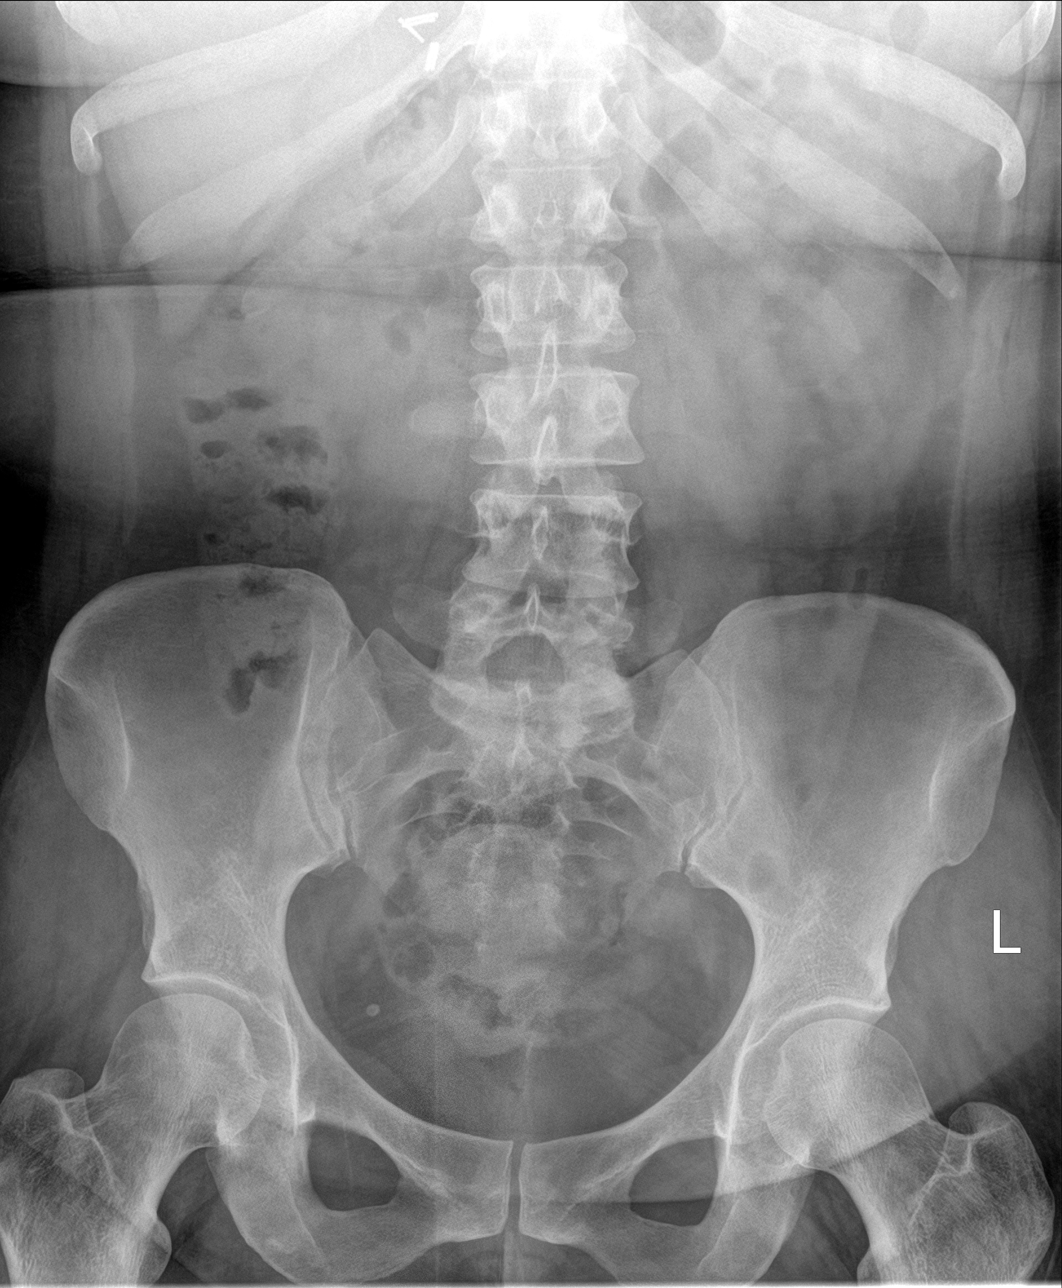

[3 of 3 positions shown; findings below may reference images not displayed]

FINDINGS: PA chest: Lungs are clear. Heart size and pulmonary vascularity are
normal. No adenopathy. There is a stimulator with leads extending to
the level of C7 on the left.

Supine and upright abdomen: There is moderate stool in the colon.
There is no bowel dilatation or air-fluid level suggesting
obstruction. No free air. There is a phlebolith in the right pelvis.
There are surgical clips in the right upper quadrant. There is a 2
mm calculus in the lower pole of the left kidney.
IMPRESSION: Moderate stool in the colon. The bowel gas pattern is normal. No
obstruction or free air. Small lower pole left renal calculus. Lungs
clear.

## 2017-01-12 ENCOUNTER — Telehealth: Payer: Self-pay | Admitting: Neurology

## 2017-01-12 NOTE — Telephone Encounter (Signed)
Pt husband calling in refills for pt for  lacosamide (VIMPAT) 200 MG TABS tablet  Topiramate ER 100 MG CP24  Please call into  [facility] - [place], [place] - [address] [phone] (Phone) [phone] (Fax)

## 2017-01-12 NOTE — Telephone Encounter (Signed)
Called the patient to make her aware that she had not been seen in over a year and I would have to establish a apt before I could refill her medications. There was a cancellation on the schedule for tomorrow at 11:00. Pt stated that she will be able to make that. Told pt to arrive at 10:30 for check in. Pt verbalized understanding.

## 2017-01-13 ENCOUNTER — Telehealth: Payer: Self-pay

## 2017-01-13 ENCOUNTER — Ambulatory Visit: Payer: 59 | Admitting: Neurology

## 2017-01-13 ENCOUNTER — Encounter: Payer: Self-pay | Admitting: Neurology

## 2017-01-13 VITALS — BP 139/86 | HR 72 | Ht 62.0 in | Wt 247.0 lb

## 2017-01-13 DIAGNOSIS — Z9689 Presence of other specified functional implants: Secondary | ICD-10-CM | POA: Diagnosis not present

## 2017-01-13 DIAGNOSIS — G40B09 Juvenile myoclonic epilepsy, not intractable, without status epilepticus: Secondary | ICD-10-CM

## 2017-01-13 MED ORDER — TOPIRAMATE ER 100 MG PO CAP24: 100.0000 mg | ORAL_CAPSULE | Freq: Every day | ORAL | 3 refills | Status: DC

## 2017-01-13 MED ORDER — ZOLPIDEM TARTRATE 10 MG PO TABS: 10.0000 mg | ORAL_TABLET | Freq: Every evening | ORAL | 0 refills | Status: DC | PRN

## 2017-01-13 MED ORDER — PROMETHAZINE HCL 25 MG PO TABS: 25.0000 mg | ORAL_TABLET | Freq: Three times a day (TID) | ORAL | 1 refills | Status: DC | PRN

## 2017-01-13 MED ORDER — LACOSAMIDE 200 MG PO TABS: ORAL_TABLET | ORAL | 3 refills | Status: DC

## 2017-01-13 NOTE — Addendum Note (Signed)
Addended by: Melvyn NovasHMEIER, Shondrea Steinert on: 01/13/2017 12:01 PM   Modules accepted: Orders

## 2017-01-13 NOTE — Progress Notes (Signed)
[facility]  Interval update 12/31/2014: Migraines are decreased. She is receiving good benefit from these procedures.   History per Dr Lucia GaskinsAhern, : [name] is a 41 y.o. female here as a referral from Dr. Lorin PicketScott for migraines. PMHx juvenile myoclonic epilepsy s/p VNS. Migraines were daily and  Since last sphenocath procedure she has only had one migraines. This is an 80% reduction in migraines.   Migraines for many years. She has 15-20 headaches monthly. 8 or more migraines a month. Symptoms are right sided,pounding and throbbing. She can feel her heartbeat in her eye. She has moments of sharp and stabbing pain. Headaches started in middle school. Getting worse. endorses nausea and vomiting, light sensitivity and sound sensitivity. She has tried botox in the past and it didn't help at previous neurologist. Jamal Maesried it twice. She is on Topamax for epilepsy as well as Vimpat. Tried Depakote in the past. Propranolol was also tried. Verapamil was not tried. Fioricet tried for acute magaement. She was on butorphenol nose spray at one point but overused it. Has not tried nortriptyline or amitriptyline. Migraines worsened a year ago. Previous to this her headaches were sporadic. Unclear what triggered the headaches or why they have been worsening. Headaches are worse the week of her menstrual cycle. She doesn't snore at night. She has trouble initiating sleep. Headaches start shortly after she wakes up. She has tried triptans as well as injectable imitrex. She takes ambien at night to help with sleep. No triggers or patterns that she can distinguish. No weakness or neck stiffness or fever/chills or other systemic signs.   41 year old with chronic migraines, intractable. She has seen previous neurologists and tried multiple medications for both preventative and acute management. Can try Verapamil: Will start Verapamil. Discussed side effects which can include Dizziness, slow heartbeat,  constipation, stomach upset, nausea, headache, and tiredness. If any of these effects persist or worsen or you experience anything else please stop medication and call us. Also gave UpToDate patient handout on the medication. Will try ZOmig or Onzetra atonset of headache, provided sample. If they work for acute management we can order them. I would like an MRI of the brain given worsening headaches but unsure of this can be done with her VNS. Will discuss with Dr. Vickey Hugerohmeier. She tried botox in the past. [name] returned from her MRI brain and for the procedure her VNS stimulator had to be switched off. System diagnostic data as of 1400 hrs. show the model to be ASPIRE S 106, serial number is [id number], implanted on 07-04-14, impedance value is 390 ohms. Prior to today's switch off the patient had an output of 0.5 mA, signal frequency was 20 Hz, pulse width was 250 s, signal on time 30 seconds take off time 3 minutes. I will stimulation set for an output current of 0.625 mA air pulse with 2 or 50 s, signal on time 60 seconds. Magnet output 0.750 mA air, pulse width 250 s signal on time 60 seconds. Bumped up to 0.75 ,  amplitude  now be at 0.625 mA and output current will be set to 0.875 mA.  Since the patient's stimulator is new there is no battery life interrogation necessary ( 100%).  We also reviewed the tachycardia detection since implantation in  6th of May 2016- the patient had 1059 magnet activations for tachycardia.   Interval history from 01-13-15, The patient reports that she had multiple seizures since last being seen here. In  her last visit we had increased her output current signal frequency. The patient reports that her last seizure was just yesterday. In September she had sought help at the hospital emergency room after developing abdominal cramping and vomiting. She was diagnosed with a hiatal hernia and esophageal erosions. The erosions often follow vomiting and wrenching. Be me today to  interrogate her vagal nerve stimulator as well as seeing if any additional changes have to be made.  She had insufficient response to several medications. VNS implantation decreased seizure frequency from 12  a month to 2 a month,  The seizures feel shorter with a shorter postictum. The typical post ictal for Mrs. Mcdonell was nausea and headaches. She did not feel necessarily confused. Her husband had witnessed multiple spells before with about a 5-10 minutes total recovery time following a seizure. Now only 2-3 minutes, some without postictum. Interrogation 01-13-15 : Total of 1160 magnet activations for tachycardia, 60 autostimulations on average per day since last visit. She used to be at 4% now she is at 14%. She's currently at 0.625 mA for output current. Signal frequency is 20 Hz, pulse width is 250 s, signal on time 30 seconds, signal off time is 3 minutes. Magnet output current 0.87 mA, pulse width 250 s, signal on time 60 seconds. Auto stimulation settings; output current 0.75 mA, pulse width 250 s signal on time 60 seconds. We adjusted today the output current to 0.75 mA, magnet output current 1.0 mA, auto-stimulation setting current 0.875 milliamperes.   01-27-15 The patient returns today after a flurry of seizures the first one occurring right on 1116 the morning after her last interrogation and titration of her VNS stimulator. She had 3 more seizures since. These seem to be very prolonged protracted over several minutes and characterized in 3 phases which the husband was able to capture on a video. Initially the patient does hypoventilation leading with her eyes wide open no blink reflex is noted, she seems to not move, pulse she maybe tonically extended. The video captured her lying on the kitchen floor. After about 4 minutes 3-4 minutes of hyperventilation she started moaning than her eyes became closed for several minutes then a third phase began when the patient was able to turn over she  was not responding to any verbal stimuli and the video also captured her husband trying to use the magnet on a VNS stimulator which did not change the outcome. The seizure is protracted and characterized by non-convulsive activity. There may have been a convulsions prior to going to the ground which was not captured.She is nauseated, very quiet and looks at of she cried. The patient is frustrated and depressed, demure. The activity on video is not typical for JME .  She felt the changes in settings were not affecting her well being other wise.  Interrogation of the VNS stimulator: The patient had 60 Auto-stimulations per day since her last visit an average of 13% per day for percent circumflex therapy time since the previous office visit from November 15. Magnet stimulation was not documented except for 11- 16-16, there is no documented magnet stimulation otherwise. Patient reported swiping twice daily. No records of this found. Adjusting today the VNS therapy to an output current in milliampere of 1.0, magnet output current to 1.25 mA air out of stimulation to 1.125 mA, battery life does not have to be interrogated in a new device like this one. Her grooming was completed change for 3 settings. The patient reports feeling  the stimulation. She will swipe at home with a new magnet provided by Mr. Girtha HakeBonanza, as her swipes were not recorded.   Interval procedure from 07/29/2015, VNS stimulator interrogation. Patient reported 3 seizures since last intervention, feels that magnet shortened the events. Diagnostic information is obtained impedance value was obtained . Output current in milliampere is 1.25, signal frequency 20 hertz , pulse width 250 s, signal on time 30 seconds signal off time 3 minutes. The output current was adjusted today to 1.375 mA. Output current for the magnet will be 1.625 mA output current for the outer stimulator will be increased to 1.375 mA she has 4-6 out of stimulation events per day  as slight decrease from 60 and last visits notes.The patient has occasional dysphonia noted during the stimulation phase of the vagal nerve stimulator.. She swiped the magnet today - the VNS stimulator is activated.Aura before seizures. This helps to abort a seizure. JME.    Model IDS - 106 , serial number is [id number] implanted on 07/04/2014.The device has an  Auto-stim feature.  Communication intact output current status okay current delivered 1.000 mA , lead impedance is okay impedance value was 3362 and no interference noted. Outer systems frequency is 63 per day, total number of magnet activations are 1668. Incremental increase in magnet output be increased the output current in milliampere from 1.0-1.125 today signal frequency is 20 Hz magnet output current in milliampere from 1.250-1.375 with a pulse width of 250 s out of stimulation output current will be 1.125 increased to 1.250 pulse width 250 s  model IDS by her as our 106, implanted on 07/04/2014. The current delivered is 1.125 mA /lead impedance was intact/ impedance value is 3134 ohm. We changed the settings with the interrogation and full battery life indicator. 2.25 m ampere. Output current is 1.25 daily impaired magnet output 1.5 mA output current milliampere  is 1.375 mA at a pulse width of 250 s signal on time 60 seconds    01-13-2017 I have the pleasure of seeing Claudette LawsStephanie Skop today, she is an established VNS patient for the treatment of juvenile myoclonic epilepsy.  Visit dated 13 January 2017.  Patient needs refills of her antiepileptic as well as an interrogation of her VNS stimulator which has not been taking place over the last 18 months. She swipes the magnet every day, has still hoarseness.  Within I the last 3 month her seizures were rare and clustered along the menstrual cycle. Progesterone dependent. Gynecologist placed her on hormonal birth control.   Ms. Katrinka BlazingSmith not feel that this was helpful.  Seizures clustered  2  days before onset of her menstrual cycle.No myoclonus seen here, but reported to affect her in AM , right after waking up.   Interrogation of the VNS device on 13 January 2017, VNS output current in milliampere is 1.5 signal frequency  20 Hz. ,  pulse width 250 microseconds , signal on time 30 seconds, signal off time 3 minutes.  Auto stimulation output current is 1.625 mg.,  Pulse width is 250 s, signal on time is 60 seconds  Magnet output current is 1.75 mA, pulse width 250 s, signal on time 60 seconds.  Tachycardia detection is on.  Threshold for auto- stimulation is a heart rate change of 40%  Diagnostic information for the device itself patient ID SNS, model ID aspire SR 106 serial #[id number], impedance intact value 295 9 ohm the current delivered was 1.5 mA.   Neurological Exam. General: The patient is  awake, alert and appears not in acute distress. The patient is well groomed. Mallampati 4 - without bite marks on tongue or buccal tissue,  Neck 16 inches.  Runny nose, no tremor , no stuttering.  Head: Normocephalic, atraumatic. Neck is supple. Respiratory: Lungs are clear to auscultation. Skin:  Without evidence of edema, or rash.  Trunk: BMI is elevated and patient  has normal posture. Neurologic exam : The patient is awake and alert, oriented to place and time. Memory subjective described as intact.  There is a normal attention span & concentration ability. Speech is fluent without dysarthria,  but right after a VNS stimulation the patient is hoarse with some dysphonia, she also has some tightness at the throat and neck, no aphasia.  Mood and affect are demure, quiet. Usually her husbands speaks for her.  Cranial nerves: Pupils are equal and briskly reactive to light. Funduscopic exam without evidence of pallor or edema. Extraocular movements  in vertical and horizontal planes intact and without nystagmus. Visual fields by finger perimetry are intact. Hearing to finger rub intact.  Facial  sensation intact to fine touch. Facial motor strength is symmetric and tongue and uvula move midline. Sensory- intact to all primary modalities.  Motor exam: Normal tone and normal muscle bulk and symmetric normal strength in all extremities.     The patient has been twice declined for disability. JME based .she now has a hearing with a judge and is optimistic that she can qualify.  The patient had also a expert witness in form of an occupational health specialist that confirmed that she would not be able to operate machinery, drive, work on an open flame, work with chemicals, so there are a lot of restrictions for her.   Assessment:  After physical and neurologic examination, review of laboratory studies, imaging, neurophysiology testing and pre-existing records,-  1) JME on VNS and medication.  Interrogated, but not reset.    2) Patient with a lot of stress, IBS and Migraines.  The migraines have improved - had one severe in last 3 month , lasting 2 days. She feels that she responded to sphenocath procedure . The patient's almost chronic headaches has benefited from that the injections with Dr. Lucia GaskinsAhern. She took a shenoid cath treatment, times 5 improved HA! No longer on verapamil.    3) obesity - BMI and neck size- high risk for apnea. The patient confirmed that she is not pregnant - refilled Lacosamide/ Vimpat at 200 mg twice daily. Gave a 90 days supply.     Melvyn Novasarmen Kaysie Michelini, M.D.   01-13-2017   [facility]   Rv with me and VNS in 12 month   PCP : [name],

## 2017-01-13 NOTE — Telephone Encounter (Signed)
I received a prior auth request for trokendi. I have completed and submitted the PA and should have a determination within 48-72 hours.

## 2017-01-14 LAB — COMPREHENSIVE METABOLIC PANEL
A/G RATIO: 1.6 (ref 1.2–2.2)
ALT: 22 IU/L (ref 0–32)
AST: 14 IU/L (ref 0–40)
Albumin: 4.4 g/dL (ref 3.5–5.5)
Alkaline Phosphatase: 74 IU/L (ref 39–117)
BILIRUBIN TOTAL: 0.2 mg/dL (ref 0.0–1.2)
BUN/Creatinine Ratio: 25 — ABNORMAL HIGH (ref 9–23)
BUN: 18 mg/dL (ref 6–20)
CHLORIDE: 104 mmol/L (ref 96–106)
CO2: 21 mmol/L (ref 20–29)
Calcium: 9.6 mg/dL (ref 8.7–10.2)
Creatinine, Ser: 0.71 mg/dL (ref 0.57–1.00)
GFR calc Af Amer: 124 mL/min/{1.73_m2} (ref >59)
GFR calc non Af Amer: 108 mL/min/{1.73_m2} (ref >59)
Globulin, Total: 2.8 g/dL (ref 1.5–4.5)
Glucose: 99 mg/dL (ref 65–99)
POTASSIUM: 4.3 mmol/L (ref 3.5–5.2)
Sodium: 141 mmol/L (ref 134–144)
Total Protein: 7.2 g/dL (ref 6.0–8.5)

## 2017-01-16 ENCOUNTER — Telehealth: Payer: Self-pay

## 2017-01-16 NOTE — Telephone Encounter (Signed)
I received a prior auth request for vimpat. I have completed and submitted the PA and should have a determination within 48-72 hours.

## 2017-01-16 NOTE — Telephone Encounter (Signed)
Approved.  Request Reference Number: [id number]. VIMPAT TAB 200MG  is approved through 01/16/2018.

## 2017-01-16 NOTE — Telephone Encounter (Signed)
-----   Message from Melvyn Novasarmen Dohmeier, MD sent at 01/16/2017  8:16 AM EST ----- Normal metabolic panel, ex erpt for higher BUN- which is related to hydration status. Please drink more water.  DR. Lorin PicketScott     CD

## 2017-01-16 NOTE — Telephone Encounter (Signed)
I called patient. No answer and does not have a vm set up so I could not leave a message.

## 2017-01-16 NOTE — Telephone Encounter (Signed)
Approved.   Request Reference Number: [id number]. TROKENDI XR CAP 100MG  is approved through 01/13/2018.

## 2017-01-17 ENCOUNTER — Telehealth: Payer: Self-pay

## 2017-01-17 NOTE — Telephone Encounter (Signed)
Rn call patient but her vm was not set up to leave message. ------

## 2017-01-23 NOTE — Telephone Encounter (Signed)
Unable to leave vm for patient to call back. Vm not set up. This is the second attempt to give results. ------

## 2017-01-24 NOTE — Telephone Encounter (Signed)
Third attempt to give patient lab results. Vm not set up to leave message. ------

## 2017-07-12 ENCOUNTER — Telehealth: Payer: Self-pay | Admitting: Neurology

## 2017-07-12 MED ORDER — TOPIRAMATE 100 MG PO TABS: 50.0000 mg | ORAL_TABLET | Freq: Two times a day (BID) | ORAL | 3 refills | Status: DC

## 2017-07-12 MED ORDER — PROMETHAZINE HCL 25 MG PO TABS: 25.0000 mg | ORAL_TABLET | Freq: Three times a day (TID) | ORAL | 1 refills | Status: DC | PRN

## 2017-07-12 NOTE — Telephone Encounter (Signed)
Pt called she is needing rx for plain topiramate . She will be under medicare as of 07/29/17 and it does not cover trokendi XR. Please call to advise Pt also need rx forpromethazine (PHENERGAN) 25 MG tablet. Pharmacy: [facility]

## 2017-07-12 NOTE — Telephone Encounter (Signed)
I will be happy to change to Topiramate generic and  refill Phenergan/.

## 2017-07-19 NOTE — Telephone Encounter (Signed)
Called the pt back and made her aware that these changes were made for the pt and sent to [facility] pharmacy in [place], [place]. Pt verbalized understanding. Pt had no questions at this time but was encouraged to call back if questions arise.

## 2017-07-19 NOTE — Telephone Encounter (Signed)
Pt requesting a call to discuss the change in medication. Please call to advise

## 2017-12-13 ENCOUNTER — Other Ambulatory Visit: Payer: Self-pay | Admitting: Neurology

## 2018-01-30 DIAGNOSIS — Z6841 Body Mass Index (BMI) 40.0 and over, adult: Secondary | ICD-10-CM | POA: Diagnosis not present

## 2018-01-30 DIAGNOSIS — J40 Bronchitis, not specified as acute or chronic: Secondary | ICD-10-CM | POA: Diagnosis not present

## 2018-01-30 DIAGNOSIS — J329 Chronic sinusitis, unspecified: Secondary | ICD-10-CM | POA: Diagnosis not present

## 2018-02-20 ENCOUNTER — Encounter: Payer: Self-pay | Admitting: Gastroenterology

## 2018-03-08 ENCOUNTER — Encounter: Payer: Self-pay | Admitting: Gastroenterology

## 2018-03-12 DIAGNOSIS — R1011 Right upper quadrant pain: Secondary | ICD-10-CM | POA: Diagnosis not present

## 2018-03-12 DIAGNOSIS — R109 Unspecified abdominal pain: Secondary | ICD-10-CM | POA: Diagnosis not present

## 2018-03-12 DIAGNOSIS — R1031 Right lower quadrant pain: Secondary | ICD-10-CM | POA: Diagnosis not present

## 2018-03-14 ENCOUNTER — Inpatient Hospital Stay (HOSPITAL_COMMUNITY)
Admission: AD | Admit: 2018-03-14 | Discharge: 2018-03-15 | Disposition: A | Discharge status: Home / Self Care | Payer: Medicare HMO | Attending: Obstetrics and Gynecology | Admitting: Obstetrics and Gynecology

## 2018-03-14 ENCOUNTER — Inpatient Hospital Stay (HOSPITAL_COMMUNITY): Payer: Medicare HMO

## 2018-03-14 ENCOUNTER — Encounter (HOSPITAL_COMMUNITY): Payer: Self-pay

## 2018-03-14 DIAGNOSIS — R140 Abdominal distension (gaseous): Secondary | ICD-10-CM

## 2018-03-14 DIAGNOSIS — R109 Unspecified abdominal pain: Secondary | ICD-10-CM | POA: Diagnosis not present

## 2018-03-14 DIAGNOSIS — R1030 Lower abdominal pain, unspecified: Secondary | ICD-10-CM

## 2018-03-14 DIAGNOSIS — M549 Dorsalgia, unspecified: Secondary | ICD-10-CM

## 2018-03-14 DIAGNOSIS — N200 Calculus of kidney: Secondary | ICD-10-CM

## 2018-03-14 DIAGNOSIS — M5489 Other dorsalgia: Secondary | ICD-10-CM

## 2018-03-14 DIAGNOSIS — Z87442 Personal history of urinary calculi: Secondary | ICD-10-CM | POA: Insufficient documentation

## 2018-03-14 DIAGNOSIS — R1011 Right upper quadrant pain: Secondary | ICD-10-CM | POA: Insufficient documentation

## 2018-03-14 DIAGNOSIS — G8929 Other chronic pain: Secondary | ICD-10-CM

## 2018-03-14 DIAGNOSIS — N83201 Unspecified ovarian cyst, right side: Secondary | ICD-10-CM

## 2018-03-14 LAB — COMPREHENSIVE METABOLIC PANEL
ALBUMIN: 4.1 g/dL (ref 3.5–5.0)
ALT: 26 U/L (ref 0–44)
AST: 23 U/L (ref 15–41)
Alkaline Phosphatase: 51 U/L (ref 38–126)
Anion gap: 8 (ref 5–15)
BILIRUBIN TOTAL: 0.8 mg/dL (ref 0.3–1.2)
BUN: 11 mg/dL (ref 6–20)
CO2: 27 mmol/L (ref 22–32)
Calcium: 9.9 mg/dL (ref 8.9–10.3)
Chloride: 103 mmol/L (ref 98–111)
Creatinine, Ser: 0.81 mg/dL (ref 0.44–1.00)
GFR calc Af Amer: >60 mL/min (ref >60)
GFR calc non Af Amer: >60 mL/min (ref >60)
GLUCOSE: 97 mg/dL (ref 70–99)
Potassium: 4 mmol/L (ref 3.5–5.1)
Sodium: 138 mmol/L (ref 135–145)
TOTAL PROTEIN: 7.2 g/dL (ref 6.5–8.1)

## 2018-03-14 LAB — URINALYSIS, ROUTINE W REFLEX MICROSCOPIC
BACTERIA UA: NONE SEEN
Bilirubin Urine: NEGATIVE
Glucose, UA: NEGATIVE mg/dL
Ketones, ur: NEGATIVE mg/dL
Leukocytes, UA: NEGATIVE
Nitrite: NEGATIVE
PROTEIN: NEGATIVE mg/dL
SPECIFIC GRAVITY, URINE: 1.009 (ref 1.005–1.030)
pH: 6 (ref 5.0–8.0)

## 2018-03-14 LAB — CBC
HCT: 41.9 % (ref 36.0–46.0)
Hemoglobin: 14.1 g/dL (ref 12.0–15.0)
MCH: 30.7 pg (ref 26.0–34.0)
MCHC: 33.7 g/dL (ref 30.0–36.0)
MCV: 91.3 fL (ref 80.0–100.0)
Platelets: 310 10*3/uL (ref 150–400)
RBC: 4.59 MIL/uL (ref 3.87–5.11)
RDW: 12.6 % (ref 11.5–15.5)
WBC: 7.1 10*3/uL (ref 4.0–10.5)
nRBC: 0 % (ref 0.0–0.2)

## 2018-03-14 LAB — POCT PREGNANCY, URINE: PREG TEST UR: NEGATIVE

## 2018-03-14 MED ORDER — HYOSCYAMINE SULFATE 0.125 MG SL SUBL
0.1250 mg | SUBLINGUAL_TABLET | Freq: Once | SUBLINGUAL | Status: AC
  Administered 2018-03-14: 0.125 mg via SUBLINGUAL
  Filled 2018-03-14: qty 1

## 2018-03-14 MED ORDER — SIMETHICONE 80 MG PO CHEW
160.0000 mg | CHEWABLE_TABLET | Freq: Once | ORAL | Status: AC
  Administered 2018-03-14: 160 mg via ORAL
  Filled 2018-03-14: qty 2

## 2018-03-14 MED ORDER — METOCLOPRAMIDE HCL 10 MG PO TABS
10.0000 mg | ORAL_TABLET | Freq: Once | ORAL | Status: AC
  Administered 2018-03-14: 10 mg via ORAL
  Filled 2018-03-14: qty 1

## 2018-03-14 MED ORDER — OXYCODONE-ACETAMINOPHEN 5-325 MG PO TABS
2.0000 | ORAL_TABLET | Freq: Once | ORAL | Status: AC
  Administered 2018-03-14: 2 via ORAL
  Filled 2018-03-14: qty 2

## 2018-03-14 NOTE — MAU Provider Note (Addendum)
History     CSN: [id number]  Arrival date and time: 03/14/18 1849  [place]    Chief Complaint  Patient presents with  . Abdominal Pain  . Back Pain   Traci [name] is a 43 y.o. who presents for Abdominal Pain and Back Pain. Patient states she has been having pain since Sunday and was seen in the ER on Monday.  She was told that she has stones in her kidney and ovarian cyst.  Patient unsure of where the cyst was located and rather or not it was ruptured.  Patient states she was given a prescription for zofran and voltaren, which she has been taking without relief.  Patient states that the pain starts in her abdomin and radiates to her lower back.  She states that initially the pain came in waves, but it is now constant.  Patient reports that she does not have her appendix or gallbladder and has had kidney stones in the past.  She endorses current nausea.        OB History    Gravida  3   Para  2   Term  2   Preterm      AB  1   Living  2     SAB  1   TAB      Ectopic      Multiple      Live Births  2           Past Medical History:  Diagnosis Date  . Headache   . History of kidney stones   . Interstitial cystitis   . Seizures (HCC)     Past Surgical History:  Procedure Laterality Date  . APPENDECTOMY    . CESAREAN SECTION     2  . CHOLECYSTECTOMY    . KNEE SURGERY Right   . LAPAROSCOPY    . TUBAL LIGATION    . VAGUS NERVE STIMULATOR INSERTION N/A 07/04/2014   Procedure: VAGAL NERVE STIMULATOR IMPLANT;  Surgeon: Lisbeth Renshaw, MD;  Location: [facility];  Service: Neurosurgery;  Laterality: N/A;    Family History  Problem Relation Age of Onset  . Cancer Mother   . Diabetes Father   . Cancer Maternal Grandmother   . Cancer Maternal Grandfather   . Diabetes Paternal Grandmother   . Diabetes Paternal Grandfather   . Migraines Neg Hx     Social History   Tobacco Use  . Smoking status: Never Smoker  . Smokeless tobacco: Never Used   Substance Use Topics  . Alcohol use: No    Alcohol/week: 0.0 standard drinks  . Drug use: No    Allergies: No Known Allergies  Medications Prior to Admission  Medication Sig Dispense Refill Last Dose  . Diclofenac Potassium 50 MG PACK Take once daily as needed with headache onset. Please take with food 9 each 11 Taking  . lacosamide (VIMPAT) 200 MG TABS tablet Take 1 tablet in the morning and 1/2 tablet in the evening. 180 tablet 3   . pantoprazole (PROTONIX) 40 MG tablet Take 1 tablet (40 mg total) by mouth daily. (Patient taking differently: Take 80 mg by mouth daily. ) 30 tablet 0 Taking  . promethazine (PHENERGAN) 25 MG tablet Take 1 tablet (25 mg total) by mouth every 8 (eight) hours as needed for nausea or vomiting. 90 tablet 1   . sucralfate (CARAFATE) 1 g tablet    Taking  . topiramate (TOPAMAX) 100 MG tablet TAKE 1/2 TABLET(50 MG) BY  MOUTH TWICE DAILY 30 tablet 2   . zolpidem (AMBIEN) 10 MG tablet Take 1 tablet (10 mg total) at bedtime as needed by mouth for sleep. 30 tablet 0     Review of Systems  Constitutional: Negative for chills and fever.  Gastrointestinal: Positive for abdominal pain and nausea. Negative for vomiting.  Genitourinary: Negative for dysuria and urgency.  Musculoskeletal: Positive for back pain.   Physical Exam   Blood pressure (!) 156/107, pulse 83, temperature 98.3 F (36.8 C), temperature source Oral, resp. rate 18, height 5\' 2"  (1.575 m), weight 113.9 kg, last menstrual period 02/27/2018, SpO2 98 %.  Physical Exam  Constitutional: She is oriented to person, place, and time. She appears well-developed and well-nourished. She appears distressed.  Obese  HENT:  Head: Normocephalic and atraumatic.  Eyes: Conjunctivae are normal.  Neck: Normal range of motion.  Cardiovascular: Normal rate, regular rhythm and normal heart sounds.  Respiratory: Effort normal and breath sounds normal.  GI: Soft. Bowel sounds are normal. Hepatosplenomegaly: Right  Side. There is abdominal tenderness in the right upper quadrant and right lower quadrant. There is guarding and CVA tenderness. There is no rigidity, no rebound and negative Murphy's sign.  Musculoskeletal: Normal range of motion.  Neurological: She is alert and oriented to person, place, and time.  Skin: Skin is warm and dry.  Psychiatric: She has a normal mood and affect. Her behavior is normal.    MAU Course  Procedures   MDM PE Labs: UA, CBC, CMP US-Renal/Pelvic Pain Medication  Assessment and Plan  43 year old Right Upper Quadrant pain    -Exam findings discussed -Labs to be collected -Renal and Pelvic US ordered  -Percocet 2 tabs given for pain -Will reassess after completion of US and labs  Follow Up (10:09 PM) -Report given to M. Mayford KnifeWilliams, CNM for assumption of care.  Cherre RobinsJessica L Emly MSN, CNM 03/14/2018, 9:02 PM   States got some relief from Percocet but US procedure brought pain back [place] Renal  Result Date: 03/14/2018 CLINICAL DATA:  Right upper quadrant and low abdominal pain for 3 days. History of renal stones. EXAM: RENAL / URINARY TRACT ULTRASOUND COMPLETE COMPARISON:  Right upper quadrant ultrasound 03/12/2018. CT abdomen and pelvis 03/12/2018. FINDINGS: Right Kidney: Renal measurements: 10 x 4.6 x 5.1 cm = volume: 117 mL . Echogenicity within normal limits. No mass or hydronephrosis visualized. Left Kidney: Renal measurements: 10.6 x 5.6 x 4.6 cm = volume: 136 mL. Echogenicity within normal limits. No mass or hydronephrosis visualized. Bladder: Appears normal for degree of bladder distention. Bilateral urine flow jets seen on color flow Doppler imaging. IMPRESSION: Normal appearance of the kidneys and bladder.  No hydronephrosis. Electronically Signed   By: Burman NievesWilliam  Stevens M.D.   On: 03/14/2018 23:29   [place] Pelvic Complete With Transvaginal  Result Date: 03/14/2018 CLINICAL DATA:  Abdominal pain and back pain for 3 days. Outside ultrasound showed renal stones and  ovarian cyst. EXAM: TRANSABDOMINAL AND TRANSVAGINAL ULTRASOUND OF PELVIS TECHNIQUE: Both transabdominal and transvaginal ultrasound examinations of the pelvis were performed. Transabdominal technique was performed for global imaging of the pelvis including uterus, ovaries, adnexal regions, and pelvic cul-de-sac. It was necessary to proceed with endovaginal exam following the transabdominal exam to visualize the ovaries and endometrium. COMPARISON:  CT abdomen and pelvis 03/12/2018 FINDINGS: Uterus Measurements: 8.9 x 4 x 4.2 cm = volume: 78 mL. Limited visualization. No myometrial mass lesions identified. Uterus is mildly anteverted. Endometrium Endometrium is not well visualized.  Thickness  is not measured. Right ovary A simple appearing cyst is demonstrated in the right adnexa. The ovary is not additionally visualized. The cyst measures 3.1 x 2.6 x 2.5 cm in diameter. This is likely physiologic. Left ovary Left ovary is not visualized. Other findings No abnormal free fluid. Examination is significantly technically limited due to patient's body habitus and bowel gas. IMPRESSION: Limited examination. Visualized uterus appears normal. Endometrium is not visualized. Right adnexal cyst measuring 3.1 cm maximal diameter, likely physiologic. Ovaries are not otherwise visualized. Electronically Signed   By: Burman Nieves M.D.   On: 03/14/2018 23:23   Reviewed findings  No obvious source of pain Excessive bowel gas may suggest that this is a source of pain Will give Levsin and Simethicone Patient requesting Percocet to go home with .  I discussed I don't have a specific reason to prescribe this.  I can give her Simethicone Suggest she call her GI specialist, with whom she is scheduled for endoscopy in February  Again she requests Rx for Percocet for discharge Explained I don't have a finding to base this on so cannot prescribe  Will discharge with SImethicone and Levsin Aviva Signs, CNM

## 2018-03-14 NOTE — MAU Note (Signed)
Pt here for upper abdominal pain that radiates into back. Pt states the pain started on Sunday. Pt states the pain is a constant, sharp pain. Rates pain 9/10. Pt states she went to ED in [place] on Monday and was given Voltaren and Zofran for pain and nausea. Had a CT scan and [place] and was told she had an ovarian cyst and kidney stones. Pt states she has nausea but only vomited x1. Was given a number for doctor to follow up with and she called but has not had her call returned. LMP: 02/27/2018.

## 2018-03-15 ENCOUNTER — Other Ambulatory Visit (HOSPITAL_COMMUNITY): Payer: Self-pay | Admitting: Advanced Practice Midwife

## 2018-03-15 DIAGNOSIS — N200 Calculus of kidney: Secondary | ICD-10-CM

## 2018-03-15 MED ORDER — HYOSCYAMINE SULFATE SL 0.125 MG SL SUBL: 1.0000 | SUBLINGUAL_TABLET | Freq: Three times a day (TID) | SUBLINGUAL | 0 refills | Status: DC | PRN

## 2018-03-15 MED ORDER — SIMETHICONE 80 MG PO CHEW: 80.0000 mg | CHEWABLE_TABLET | Freq: Four times a day (QID) | ORAL | 0 refills | Status: DC | PRN

## 2018-03-15 NOTE — Discharge Instructions (Signed)
Abdominal Bloating When you have abdominal bloating, your abdomen may feel full, tight, or painful. It may also look bigger than normal or swollen (distended). Common causes of abdominal bloating include:  Swallowing air.  Constipation.  Problems digesting food.  Eating too much.  Irritable bowel syndrome. This is a condition that affects the large intestine.  Lactose intolerance. This is an inability to digest lactose, a natural sugar in dairy products.  Celiac disease. This is a condition that affects the ability to digest gluten, a protein found in some grains.  Gastroparesis. This is a condition that slows down the movement of food in the stomach and small intestine. It is more common in people with diabetes mellitus.  Gastroesophageal reflux disease (GERD). This is a digestive condition that makes stomach acid flow back into the esophagus.  Urinary retention. This means that the body is holding onto urine, and the bladder cannot be emptied all the way. Follow these instructions at home: Eating and drinking  Avoid eating too much.  Try not to swallow air while talking or eating.  Avoid eating while lying down.  Avoid these foods and drinks: ? Foods that cause gas, such as broccoli, cabbage, cauliflower, and baked beans. ? Carbonated drinks. ? Hard candy. ? Chewing gum. Medicines  Take over-the-counter and prescription medicines only as told by your health care provider.  Take probiotic medicines. These medicines contain live bacteria or yeasts that can help digestion.  Take coated peppermint oil capsules. Activity  Try to exercise regularly. Exercise may help to relieve bloating that is caused by gas and relieve constipation. General instructions  Keep all follow-up visits as told by your health care provider. This is important. Contact a health care provider if:  You have nausea and vomiting.  You have diarrhea.  You have abdominal pain.  You have unusual  weight loss or weight gain.  You have severe pain, and medicines do not help. Get help right away if:  You have severe chest pain.  You have trouble breathing.  You have shortness of breath.  You have trouble urinating.  You have darker urine than normal.  You have blood in your stools or have dark, tarry stools. Summary  Abdominal bloating means that the abdomen is swollen.  Common causes of abdominal bloating are swallowing air, constipation, and problems digesting food.  Avoid eating too much and avoid swallowing air.  Avoid foods that cause gas, carbonated drinks, hard candy, and chewing gum. This information is not intended to replace advice given to you by your health care provider. Make sure you discuss any questions you have with your health care provider. Document Released: 03/18/2016 Document Revised: 03/18/2016 Document Reviewed: 03/18/2016 Elsevier Interactive Patient Education  2019 [employer]   Abdominal Pain, Adult Abdominal pain can be caused by many things. Often, abdominal pain is not serious and it gets better with no treatment or by being treated at home. However, sometimes abdominal pain is serious. Your health care provider will do a medical history and a physical exam to try to determine the cause of your abdominal pain. Follow these instructions at home:  Take over-the-counter and prescription medicines only as told by your health care provider. Do not take a laxative unless told by your health care provider.  Drink enough fluid to keep your urine clear or pale yellow.  Watch your condition for any changes.  Keep all follow-up visits as told by your health care provider. This is important. Contact a health care  provider if:  Your abdominal pain changes or gets worse.  You are not hungry or you lose weight without trying.  You are constipated or have diarrhea for more than 2-3 days.  You have pain when you urinate or have a bowel  movement.  Your abdominal pain wakes you up at night.  Your pain gets worse with meals, after eating, or with certain foods.  You are throwing up and cannot keep anything down.  You have a fever. Get help right away if:  Your pain does not go away as soon as your health care provider told you to expect.  You cannot stop throwing up.  Your pain is only in areas of the abdomen, such as the right side or the left lower portion of the abdomen.  You have bloody or black stools, or stools that look like tar.  You have severe pain, cramping, or bloating in your abdomen.  You have signs of dehydration, such as: ? Dark urine, very little urine, or no urine. ? Cracked lips. ? Dry mouth. ? Sunken eyes. ? Sleepiness. ? Weakness. This information is not intended to replace advice given to you by your health care provider. Make sure you discuss any questions you have with your health care provider. Document Released: 11/24/2004 Document Revised: 09/04/2015 Document Reviewed: 07/29/2015 Elsevier Interactive Patient Education  2019 [employer]  Ovarian Cyst     An ovarian cyst is a fluid-filled sac that forms on an ovary. The ovaries are small organs that produce eggs in women. Various types of cysts can form on the ovaries. Some may cause symptoms and require treatment. Most ovarian cysts go away on their own, are not cancerous (are benign), and do not cause problems. Common types of ovarian cysts include:  Functional (follicle) cysts. ? Occur during the menstrual cycle, and usually go away with the next menstrual cycle if you do not get pregnant. ? Usually cause no symptoms.  Endometriomas. ? Are cysts that form from the tissue that lines the uterus (endometrium). ? Are sometimes called chocolate cysts because they become filled with blood that turns brown. ? Can cause pain in the lower abdomen during intercourse and during your period.  Cystadenoma cysts. ? Develop from cells  on the outside surface of the ovary. ? Can get very large and cause lower abdomen pain and pain with intercourse. ? Can cause severe pain if they twist or break open (rupture).  Dermoid cysts. ? Are sometimes found in both ovaries. ? May contain different kinds of body tissue, such as skin, teeth, hair, or cartilage. ? Usually do not cause symptoms unless they get very big.  Theca lutein cysts. ? Occur when too much of a certain hormone (human chorionic gonadotropin) is produced and overstimulates the ovaries to produce an egg. ? Are most common after having procedures used to assist with the conception of a baby (in vitro fertilization). What are the causes? Ovarian cysts may be caused by:  Ovarian hyperstimulation syndrome. This is a condition that can develop from taking fertility medicines. It causes multiple large ovarian cysts to form.  Polycystic ovarian syndrome (PCOS). This is a common hormonal disorder that can cause ovarian cysts, as well as problems with your period or fertility. What increases the risk? The following factors may make you more likely to develop ovarian cysts:  Being overweight or obese.  Taking fertility medicines.  Taking certain forms of hormonal birth control.  Smoking. What are the signs or symptoms? Many  ovarian cysts do not cause symptoms. If symptoms are present, they may include:  Pelvic pain or pressure.  Pain in the lower abdomen.  Pain during sex.  Abdominal swelling.  Abnormal menstrual periods.  Increasing pain with menstrual periods. How is this diagnosed? These cysts are commonly found during a routine pelvic exam. You may have tests to find out more about the cyst, such as:  Ultrasound.  X-ray of the pelvis.  CT scan.  MRI.  Blood tests. How is this treated? Many ovarian cysts go away on their own without treatment. Your health care provider may want to check your cyst regularly for 2-3 months to see if it changes. If  you are in menopause, it is especially important to have your cyst monitored closely because menopausal women have a higher rate of ovarian cancer. When treatment is needed, it may include:  Medicines to help relieve pain.  A procedure to drain the cyst (aspiration).  Surgery to remove the whole cyst.  Hormone treatment or birth control pills. These methods are sometimes used to help dissolve a cyst. Follow these instructions at home:  Take over-the-counter and prescription medicines only as told by your health care provider.  Do not drive or use heavy machinery while taking prescription pain medicine.  Get regular pelvic exams and Pap tests as often as told by your health care provider.  Return to your normal activities as told by your health care provider. Ask your health care provider what activities are safe for you.  Do not use any products that contain nicotine or tobacco, such as cigarettes and e-cigarettes. If you need help quitting, ask your health care provider.  Keep all follow-up visits as told by your health care provider. This is important. Contact a health care provider if:  Your periods are late, irregular, or painful, or they stop.  You have pelvic pain that does not go away.  You have pressure on your bladder or trouble emptying your bladder completely.  You have pain during sex.  You have any of the following in your abdomen: ? A feeling of fullness. ? Pressure. ? Discomfort. ? Pain that does not go away. ? Swelling.  You feel generally ill.  You become constipated.  You lose your appetite.  You develop severe acne.  You start to have more body hair and facial hair.  You are gaining weight or losing weight without changing your exercise and eating habits.  You think you may be pregnant. Get help right away if:  You have abdominal pain that is severe or gets worse.  You cannot eat or drink without vomiting.  You suddenly develop a  fever.  Your menstrual period is much heavier than usual. This information is not intended to replace advice given to you by your health care provider. Make sure you discuss any questions you have with your health care provider. Document Released: 02/14/2005 Document Revised: 09/04/2015 Document Reviewed: 07/19/2015 Elsevier Interactive Patient Education  2019 [employer].

## 2018-03-18 ENCOUNTER — Other Ambulatory Visit (HOSPITAL_COMMUNITY): Payer: Self-pay | Admitting: Advanced Practice Midwife

## 2018-03-20 ENCOUNTER — Telehealth: Payer: Self-pay | Admitting: Neurology

## 2018-03-20 NOTE — Telephone Encounter (Signed)
Called the patient to ask if she would be able to come in tomorrow for apt at 10:00 am. Patient is scheduled for 11 am but the VNS lady will be here in the office and she is hoping that the patient could be seen earlier. LVM asking if patient could do this call us back and we would move her apt into the 10 am slot with check in at 9:30 am.

## 2018-03-21 ENCOUNTER — Ambulatory Visit: Payer: 59 | Admitting: Neurology

## 2018-03-21 ENCOUNTER — Ambulatory Visit: Payer: Medicare Other | Admitting: Neurology

## 2018-03-21 ENCOUNTER — Telehealth: Payer: Self-pay | Admitting: Neurology

## 2018-03-21 ENCOUNTER — Encounter: Payer: Self-pay | Admitting: Neurology

## 2018-03-21 VITALS — BP 110/78 | HR 96 | Ht 62.0 in | Wt 247.0 lb

## 2018-03-21 DIAGNOSIS — T50905D Adverse effect of unspecified drugs, medicaments and biological substances, subsequent encounter: Secondary | ICD-10-CM

## 2018-03-21 DIAGNOSIS — G40B09 Juvenile myoclonic epilepsy, not intractable, without status epilepticus: Secondary | ICD-10-CM | POA: Diagnosis not present

## 2018-03-21 DIAGNOSIS — Z9689 Presence of other specified functional implants: Secondary | ICD-10-CM

## 2018-03-21 MED ORDER — ZOLPIDEM TARTRATE 10 MG PO TABS: 10.0000 mg | ORAL_TABLET | Freq: Every evening | ORAL | 0 refills | Status: DC | PRN

## 2018-03-21 MED ORDER — LAMOTRIGINE 50 MG PO TBDP: ORAL_TABLET | ORAL | 1 refills | Status: DC

## 2018-03-21 MED ORDER — PROMETHAZINE HCL 25 MG PO TABS: 25.0000 mg | ORAL_TABLET | Freq: Three times a day (TID) | ORAL | 1 refills | Status: DC | PRN

## 2018-03-21 NOTE — Telephone Encounter (Signed)
Pt is asking if the prescription for lamoTRIgine 50 MG TBDP can be changed from extended release to regular version for insurance purposes

## 2018-03-21 NOTE — Patient Instructions (Addendum)
Lamotrigine tablets What is this medicine? LAMOTRIGINE (la MOE tri jeen) is used to control seizures in adults and children with epilepsy and Lennox-Gastaut syndrome. It is also used in adults to treat bipolar disorder. This medicine may be used for other purposes; ask your health care provider or pharmacist if you have questions. COMMON BRAND NAME(S): Lamictal, Subvenite What should I tell my health care provider before I take this medicine? They need to know if you have any of these conditions: -aseptic meningitis during prior use of lamotrigine -depression -folate deficiency -kidney disease -liver disease -suicidal thoughts, plans, or attempt; a previous suicide attempt by you or a family member -an unusual or allergic reaction to lamotrigine or other seizure medications, other medicines, foods, dyes, or preservatives -pregnant or trying to get pregnant -breast-feeding How should I use this medicine? Take this medicine by mouth with a glass of water. Follow the directions on the prescription label. Do not chew these tablets. If this medicine upsets your stomach, take it with food or milk. Take your doses at regular intervals. Do not take your medicine more often than directed. A special MedGuide will be given to you by the pharmacist with each new prescription and refill. Be sure to read this information carefully each time. Talk to your pediatrician regarding the use of this medicine in children. While this drug may be prescribed for children as young as 2 years for selected conditions, precautions do apply. Overdosage: If you think you have taken too much of this medicine contact a poison control center or emergency room at once. NOTE: This medicine is only for you. Do not share this medicine with others. What if I miss a dose? If you miss a dose, take it as soon as you can. If it is almost time for your next dose, take only that dose. Do not take double or extra doses. What may interact  with this medicine? -atazanavir -carbamazepine -female hormones, including contraceptive or birth control pills -lopinavir -methotrexate -phenobarbital -phenytoin -primidone -pyrimethamine -rifampin -ritonavir -trimethoprim -valproic acid This list may not describe all possible interactions. Give your health care provider a list of all the medicines, herbs, non-prescription drugs, or dietary supplements you use. Also tell them if you smoke, drink alcohol, or use illegal drugs. Some items may interact with your medicine. What should I watch for while using this medicine? Visit your doctor or health care professional for regular checks on your progress. If you take this medicine for seizures, wear a Medic Alert bracelet or necklace. Carry an identification card with information about your condition, medicines, and doctor or health care professional. It is important to take this medicine exactly as directed. When first starting treatment, your dose will need to be adjusted slowly. It may take weeks or months before your dose is stable. You should contact your doctor or health care professional if your seizures get worse or if you have any new types of seizures. Do not stop taking this medicine unless instructed by your doctor or health care professional. Stopping your medicine suddenly can increase your seizures or their severity. Contact your doctor or health care professional right away if you develop a rash while taking this medicine. Rashes may be very severe and sometimes require treatment in the hospital. Deaths from rashes have occurred. Serious rashes occur more often in children than adults taking this medicine. It is more common for these serious rashes to occur during the first 2 months of treatment, but a rash can occur at   any time. You may get drowsy, dizzy, or have blurred vision. Do not drive, use machinery, or do anything that needs mental alertness until you know how this medicine  affects you. To reduce dizzy or fainting spells, do not sit or stand up quickly, especially if you are an older patient. Alcohol can increase drowsiness and dizziness. Avoid alcoholic drinks. If you are taking this medicine for bipolar disorder, it is important to report any changes in your mood to your doctor or health care professional. If your condition gets worse, you get mentally depressed, feel very hyperactive or manic, have difficulty sleeping, or have thoughts of hurting yourself or committing suicide, you need to get help from your health care professional right away. If you are a caregiver for someone taking this medicine for bipolar disorder, you should also report these behavioral changes right away. The use of this medicine may increase the chance of suicidal thoughts or actions. Pay special attention to how you are responding while on this medicine. Your mouth may get dry. Chewing sugarless gum or sucking hard candy, and drinking plenty of water may help. Contact your doctor if the problem does not go away or is severe. Women who become pregnant while using this medicine may enroll in the [place] Antiepileptic Drug Pregnancy Registry by calling 1-888-233-2334. This registry collects information about the safety of antiepileptic drug use during pregnancy. This medicine may cause a decrease in folic acid. You should make sure that you get enough folic acid while you are taking this medicine. Discuss the foods you eat and the vitamins you take with your health care professional. What side effects may I notice from receiving this medicine? Side effects that you should report to your doctor or health care professional as soon as possible: -allergic reactions like skin rash, itching or hives, swelling of the face, lips, or tongue -changes in vision -depressed mood -elevated mood, decreased need for sleep, racing thoughts, impulsive behavior -fever with rash, swollen lymph nodes, or  swelling of the face -loss of balance or coordination -mouth sores -redness, blistering, peeling or loosening of the skin, including inside the mouth -right upper belly pain -seizures -severe muscle pain -signs and symptoms of aseptic meningitis such as stiff neck and sensitivity to light, headache, drowsiness, fever, nausea, vomiting, rash -signs of infection - fever or chills, cough, sore throat, pain or difficulty passing urine -suicidal thoughts or other mood changes -swollen lymph nodes -trouble walking -unusual bruising or bleeding -unusually weak or tired -yellowing of the eyes or skin Side effects that usually do not require medical attention (report to your doctor or health care professional if they continue or are bothersome): -diarrhea -dizziness -dry mouth -stuffy nose -tiredness -tremors -trouble sleeping This list may not describe all possible side effects. Call your doctor for medical advice about side effects. You may report side effects to FDA at 1-800-FDA-1088. Where should I keep my medicine? Keep out of reach of children. Store at room temperature between 15 and 30 degrees C (59 and 86 degrees F). Throw away any unused medicine after the expiration date. NOTE: This sheet is a summary. It may not cover all possible information. If you have questions about this medicine, talk to your doctor, pharmacist, or health care provider.  2019 Elsevier/Gold Standard (2016-10-03 16:07:39)  

## 2018-03-21 NOTE — Progress Notes (Signed)
03-21-2018 : Traci [name] is a 43  y.o. female here as a referral for her PMHx juvenile myoclonic epilepsy s/p VNS. Migraines were daily and  Are nowimproved  This is an 80% reduction in migraines.  There is an over 90 % reduction in seizures.  Traci [name] [facility], Dr. Arville Gohristine Dean for 18 months before she came to us in 2015-2016.  Here she had also SV procedure that for a while controlled migraines under the guidance of Dr. Naomie DeanAntonia Ahern.  We have tried multiple medications and decided on the VNS stimulator.  She has had good experiences with the VNS stimulator and while having had up to 30 spells per month she is now down to 2 or 3 usually at the beginning of her menstrual cycle. Another reduction in stress was her final acceptance for disability, based on her epilepsy syndrome.    We are interrogating the device -03-21-2018, The patient is using an aspire SR 106 model implanted on 03 August 2014 current delivered is 1500 milliampere signal frequency is 20 Hz, pulse width is 250 s, signal on time 30 seconds signal off time 3 minutes., impedance values 2744 ohm. Out of stimulation output current is 1.6 mA pulse width and microseconds 250 signal on time and seconds 60.  Magnet output current is 1.75 mA, pulse width 250 s, signal on time 60 seconds. 75% battery life- up to 5.4 years.   No changes needed. Rv in 12 month       2016 note: HPI. Migraines for many years. She has 15-20 headaches monthly. 8 or more migraines a month. Symptoms are right sided,pounding and throbbing. She can feel her heartbeat in her eye. She has moments of sharp and stabbing pain. Headaches started in middle school. Getting worse. endorses nausea and vomiting, light sensitivity and sound sensitivity. She has tried botox in the past and it didn't help at previous neurologist. Traci [name] it twice. She is on Topamax for epilepsy as well as Vimpat. Tried Depakote  in the past. Propranolol was also tried. Verapamil was not tried. Fioricet tried for acute magaement. She was on butorphenol nose spray at one point but overused it. Has not tried nortriptyline or amitriptyline. Migraines worsened a year ago. Previous to this her headaches were sporadic. Unclear what triggered the headaches or why they have been worsening. Headaches are worse the week of her menstrual cycle. She doesn't snore at night. She has trouble initiating sleep. Headaches start shortly after she wakes up. She has tried triptans as well as injectable imitrex. She takes ambien at night to help with sleep. No triggers or patterns that she can distinguish. No weakness or neck stiffness or fever/chills or other systemic signs.    Traci [name] returned from her MRI brain and for the procedure her VNS stimulator had to be switched off. System diagnostic data as of 1400 hrs. show the model to be ASPIRE S 106, serial number is [id number], implanted on 07-04-14, impedance value is 390 ohms. Prior to today's switch off the patient had an output of 0.5 mA, signal frequency was 20 Hz, pulse width was 250 s, signal on time 30 seconds take off time 3 minutes. I will stimulation set for an output current of 0.625 mA air pulse with 2 or 50 s, signal on time 60 seconds. Magnet output 0.750 mA air, pulse width 250 s signal on time 60 seconds. Bumped up to 0.75 ,  amplitude  now be at 0.625 mA and output current will be set to 0.875 mA.  Since the patient's stimulator is new there is no battery life interrogation necessary ( 100%).  We also reviewed the tachycardia detection since implantation in  6th of May 2016- the patient had 1059 magnet activations for tachycardia.   Interval history from 01-13-15, The patient reports that she had multiple seizures since last being seen here. In her last visit we had increased her output current signal frequency. The patient reports that her last seizure was just yesterday. In  September she had sought help at the hospital emergency room after developing abdominal cramping and vomiting. She was diagnosed with a hiatal hernia and esophageal erosions. The erosions often follow vomiting and wrenching. Be me today to interrogate her vagal nerve stimulator as well as seeing if any additional changes have to be made.  She had insufficient response to several medications. VNS implantation decreased seizure frequency from 12  a month to 2 a month,  The seizures feel shorter with a shorter postictum. The typical post ictal for [name] was nausea and headaches. She did not feel necessarily confused. Her husband had witnessed multiple spells before with about a 5-10 minutes total recovery time following a seizure. Now only 2-3 minutes, some without postictum. Interrogation 01-13-15 : Total of 1160 magnet activations for tachycardia, 60 autostimulations on average per day since last visit. She used to be at 4% now she is at 14%. She's currently at 0.625 mA for output current. Signal frequency is 20 Hz, pulse width is 250 s, signal on time 30 seconds, signal off time is 3 minutes. Magnet output current 0.87 mA, pulse width 250 s, signal on time 60 seconds. Auto stimulation settings; output current 0.75 mA, pulse width 250 s signal on time 60 seconds. We adjusted today the output current to 0.75 mA, magnet output current 1.0 mA, auto-stimulation setting current 0.875 milliamperes.   01-27-15 The patient returns today after a flurry of seizures the first one occurring right on 1116 the morning after her last interrogation and titration of her VNS stimulator. She had 3 more seizures since. These seem to be very prolonged protracted over several minutes and characterized in 3 phases which the husband was able to capture on a video. Initially the patient does hypoventilation leading with her eyes wide open no blink reflex is noted, she seems to not move, pulse she maybe tonically extended.  The video captured her lying on the kitchen floor. After about 4 minutes 3-4 minutes of hyperventilation she started moaning than her eyes became closed for several minutes then a third phase began when the patient was able to turn over she was not responding to any verbal stimuli and the video also captured her husband trying to use the magnet on a VNS stimulator which did not change the outcome. The seizure is protracted and characterized by non-convulsive activity. There may have been a convulsions prior to going to the ground which was not captured.She is nauseated, very quiet and looks at of she cried. The patient is frustrated and depressed, demure. The activity on video is not typical for JME .  She felt the changes in settings were not affecting her well being other wise.  Interrogation of the VNS stimulator: The patient had 60 Auto-stimulations per day since her last visit an average of 13% per day for percent circumflex therapy time since the previous office visit from November 15. Magnet stimulation was not  documented except for 11- 16-16, there is no documented magnet stimulation otherwise. Patient reported swiping twice daily. No records of this found. Adjusting today the VNS therapy to an output current in milliampere of 1.0, magnet output current to 1.25 mA air out of stimulation to 1.125 mA, battery life does not have to be interrogated in a new device like this one. Her grooming was completed change for 3 settings. The patient reports feeling the stimulation. She will swipe at home with a new magnet provided by Mr. Girtha HakeBonanza, as her swipes were not recorded.   Interval procedure from 07/29/2015, VNS stimulator interrogation. Patient reported 3 seizures since last intervention, feels that magnet shortened the events. Diagnostic information is obtained impedance value was obtained . Output current in milliampere is 1.25, signal frequency 20 hertz , pulse width 250 s, signal on time 30 seconds  signal off time 3 minutes. The output current was adjusted today to 1.375 mA. Output current for the magnet will be 1.625 mA output current for the outer stimulator will be increased to 1.375 mA she has 4-6 out of stimulation events per day as slight decrease from 60 and last visits notes.The patient has occasional dysphonia noted during the stimulation phase of the vagal nerve stimulator.. She swiped the magnet today - the VNS stimulator is activated.Aura before seizures. This helps to abort a seizure. JME.    Model IDS - 106 , serial number is [id number] implanted on 07/04/2014.The device has an  Auto-stim feature.  Communication intact output current status okay current delivered 1.000 mA , lead impedance is okay impedance value was 3362 and no interference noted. Outer systems frequency is 63 per day, total number of magnet activations are 1668. Incremental increase in magnet output be increased the output current in milliampere from 1.0-1.125 today signal frequency is 20 Hz magnet output current in milliampere from 1.250-1.375 with a pulse width of 250 s out of stimulation output current will be 1.125 increased to 1.250 pulse width 250 s  model IDS by her as our 106, implanted on 07/04/2014. The current delivered is 1.125 mA /lead impedance was intact/ impedance value is 3134 ohm. We changed the settings with the interrogation and full battery life indicator. 2.25 m ampere. Output current is 1.25 daily impaired magnet output 1.5 mA output current milliampere  is 1.375 mA at a pulse width of 250 s signal on time 60 seconds    01-13-2017 I have the pleasure of seeing Traci [name] today, she is an established VNS patient for the treatment of juvenile myoclonic epilepsy.  Visit dated 13 January 2017.  Patient needs refills of her antiepileptic as well as an interrogation of her VNS stimulator which has not been taking place over the last 18 months. She swipes the magnet every day, has still  hoarseness.  Within I the last 3 month her seizures were rare and clustered along the menstrual cycle. Progesterone dependent. Gynecologist placed her on hormonal birth control.   Ms. Katrinka [name] not feel that this was helpful.  Seizures clustered  2 days before onset of her menstrual cycle.No myoclonus seen here, but reported to affect her in AM , right after waking up.   Interrogation of the VNS device on 13 January 2017, VNS output current in milliampere is 1.5 signal frequency  20 Hz. ,  pulse width 250 microseconds , signal on time 30 seconds, signal off time 3 minutes.  Auto stimulation output current is 1.625 mg.,  Pulse width is 250 s, signal  on time is 60 seconds  Magnet output current is 1.75 mA, pulse width 250 s, signal on time 60 seconds.  Tachycardia detection is on.  Threshold for auto- stimulation is a heart rate change of 40%  Diagnostic information for the device itself patient ID SNS, model ID aspire SR 106 serial #[id number], impedance intact value 295 9 ohm the current delivered was 1.5 mA.   Neurological Exam. General: The patient is awake, alert and appears not in acute distress. The patient is well groomed. Mallampati 4 - without bite marks on tongue or buccal tissue,  Neck 16 inches.  Runny nose, no tremor , no stuttering.  Head: Normocephalic, atraumatic. Neck is supple. Respiratory: Lungs are clear to auscultation. Skin:  Without evidence of edema, or rash.  Trunk: BMI is elevated and patient  has normal posture. Neurologic exam : The patient is awake and alert, oriented to place and time. Memory subjective described as intact.  There is a normal attention span & concentration ability. Speech is fluent without dysarthria,  -right after a witnessed VNS stimulation the patient is hoarse with some dysphonia, she also has some tightness at the throat and neck, no aphasia.  Mood and affect ; she is alter, happy. Cranial nerves: Pupils are equal and briskly reactive to light.  Funduscopic exam without evidence of pallor or edema.   Facial motor strength is symmetric and tongue and uvula move midline. Sensory- intact to all primary modalities.  Motor exam: Normal tone and normal muscle bulk and symmetric normal strength in all extremities. DTR symmetric.    The patient has been accepted for JME based disability. The patient had also a expert witness in form of an occupational health specialist that confirmed that she would not be able to operate machinery, drive, work on an open flame, work with chemicals, so there are a lot of restrictions for her. 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 This is weekly, not daily as it used to be .    Assessment:  After physical and neurologic examination, review of laboratory studies, imaging, neurophysiology testing and pre-existing records,-  1) JME on VNS and medication.  Today the VNS stimulator was Interrogated, but not reset.   2) Patient with less stress, less IBS and Migraines.  The migraines have improved - had one severe in last 6 month , lasting 2 days. She feels that she responded to a spheno-catheter procedure \  3) new side effects reported in relation to  Lacosamide/ Vimpat at 200 mg twice daily. She felt her throat gets swollen, its also not covered by Medicaid.  Continues on TPM.   Melvyn Novas, M.D.   [facility]   Rv with me and VNS rep in 12 month   PCP : Lucila Maine, MD

## 2018-03-22 ENCOUNTER — Other Ambulatory Visit: Payer: Self-pay | Admitting: Neurology

## 2018-03-22 MED ORDER — LAMOTRIGINE 100 MG PO TABS: ORAL_TABLET | ORAL | 0 refills | Status: DC

## 2018-03-22 NOTE — Telephone Encounter (Signed)
I have resent the script to the pharmacy for the medication per patient request. I have corrected the dose to compare to what Dr Dohmeier had previously ordered.

## 2018-03-22 NOTE — Addendum Note (Signed)
Addended by: Judi Cong on: 03/22/2018 08:01 AM   Modules accepted: Orders

## 2018-03-27 ENCOUNTER — Other Ambulatory Visit: Payer: Self-pay | Admitting: Neurology

## 2018-03-28 ENCOUNTER — Other Ambulatory Visit: Payer: Self-pay | Admitting: Neurology

## 2018-04-03 ENCOUNTER — Telehealth: Payer: Self-pay | Admitting: *Deleted

## 2018-04-03 ENCOUNTER — Other Ambulatory Visit: Payer: Self-pay

## 2018-04-03 ENCOUNTER — Encounter: Payer: Self-pay | Admitting: Gastroenterology

## 2018-04-03 ENCOUNTER — Ambulatory Visit (AMBULATORY_SURGERY_CENTER): Payer: Self-pay | Admitting: *Deleted

## 2018-04-03 VITALS — Ht 61.0 in | Wt 246.4 lb

## 2018-04-03 DIAGNOSIS — K2100 Gastro-esophageal reflux disease with esophagitis, without bleeding: Secondary | ICD-10-CM

## 2018-04-03 DIAGNOSIS — K2270 Barrett's esophagus without dysplasia: Secondary | ICD-10-CM

## 2018-04-03 NOTE — Telephone Encounter (Signed)
Hi Traci [name],  This patient has seizures, last on in January 2020.  She is on a new medication.  Patient states she has seizures once a month around her menstrual cycle.  Is she ok for [facility]?

## 2018-04-03 NOTE — Telephone Encounter (Signed)
Karol,  This pt is cleared for anesthetic care at [facility].  Thanks,  Latrena Benegas 

## 2018-04-03 NOTE — Progress Notes (Signed)
Patient denies any allergies to egg or soy products. Patient denies complications with anesthesia/sedation.  Patient denies oxygen use at home and denies diet medications. Patient denies information on endoscopy. 

## 2018-04-17 ENCOUNTER — Ambulatory Visit (AMBULATORY_SURGERY_CENTER): Payer: Medicare HMO | Admitting: Gastroenterology

## 2018-04-17 ENCOUNTER — Encounter: Payer: Self-pay | Admitting: Gastroenterology

## 2018-04-17 VITALS — BP 144/86 | HR 75 | Temp 97.1°F | Resp 16 | Ht 62.0 in | Wt 247.0 lb

## 2018-04-17 DIAGNOSIS — K210 Gastro-esophageal reflux disease with esophagitis: Secondary | ICD-10-CM

## 2018-04-17 DIAGNOSIS — K3189 Other diseases of stomach and duodenum: Secondary | ICD-10-CM | POA: Diagnosis not present

## 2018-04-17 DIAGNOSIS — K2970 Gastritis, unspecified, without bleeding: Secondary | ICD-10-CM | POA: Diagnosis not present

## 2018-04-17 DIAGNOSIS — K2270 Barrett's esophagus without dysplasia: Secondary | ICD-10-CM

## 2018-04-17 DIAGNOSIS — K2210 Ulcer of esophagus without bleeding: Secondary | ICD-10-CM | POA: Diagnosis not present

## 2018-04-17 DIAGNOSIS — K259 Gastric ulcer, unspecified as acute or chronic, without hemorrhage or perforation: Secondary | ICD-10-CM | POA: Diagnosis not present

## 2018-04-17 MED ORDER — PANTOPRAZOLE SODIUM 40 MG PO TBEC: 40.0000 mg | DELAYED_RELEASE_TABLET | Freq: Two times a day (BID) | ORAL | 6 refills | Status: DC

## 2018-04-17 MED ORDER — SODIUM CHLORIDE 0.9 % IV SOLN: 500.0000 mL | Freq: Once | INTRAVENOUS | Status: DC

## 2018-04-17 NOTE — Op Note (Signed)
[facility] Patient Name: Traci [name] Procedure Date: 04/17/2018 10:02 AM MRN: [id number] Endoscopist: Lynann Bologna , MD Age: 44 Referring MD:  Date of Birth: Feb 24, 1976 Gender: Female Account #: [id number] Procedure:                Upper GI endoscopy Indications:              Heartburn, previous history of Barrett's on                            esophageal biopsies. Medicines:                Monitored Anesthesia Care Procedure:                Pre-Anesthesia Assessment:                           - Prior to the procedure, a History and Physical                            was performed, and patient medications and                            allergies were reviewed. The patient's tolerance of                            previous anesthesia was also reviewed. The risks                            and benefits of the procedure and the sedation                            options and risks were discussed with the patient.                            All questions were answered, and informed consent                            was obtained. Prior Anticoagulants: The patient has                            taken no previous anticoagulant or antiplatelet                            agents. ASA Grade Assessment: II - A patient with                            mild systemic disease. After reviewing the risks                            and benefits, the patient was deemed in                            satisfactory condition to undergo the procedure.  After obtaining informed consent, the endoscope was                            passed under direct vision. Throughout the                            procedure, the patient's blood pressure, pulse, and                            oxygen saturations were monitored continuously. The                            Endoscope was introduced through the mouth, and                            advanced to the second part of duodenum.  The upper                            GI endoscopy was accomplished without difficulty.                            The patient tolerated the procedure well. Scope In: Scope Out: Findings:                 LA Grade B (one or more mucosal breaks greater than                            5 mm, not extending between the tops of two mucosal                            folds) esophagitis with no bleeding was found 34 to                            36 cm from the incisors. Biopsies were taken with a                            cold forceps for histology. Estimated blood loss                            was minimal.                           A 2 cm hiatal hernia was present.                           Localized mild inflammation characterized by                            erosions was found in the gastric antrum. Biopsies                            were taken with a cold forceps for histology.  Estimated blood loss: none.                           A few erosions without bleeding were found in the                            first portion of the duodenum. Complications:            No immediate complications. Estimated Blood Loss:     Estimated blood loss: none. Impression:               - LA Grade B reflux esophagitis. Biopsied.                           - 2 cm hiatal hernia.                           - Gastritis. Biopsied.                           - Duodenal erosions without bleeding. Recommendation:           - Patient has a contact number available for                            emergencies. The signs and symptoms of potential                            delayed complications were discussed with the                            patient. Return to normal activities tomorrow.                            Written discharge instructions were provided to the                            patient.                           - Resume previous diet.                           - Protonix 40 mg  p.o. twice daily. #60, 6 refills.                           - Avoid all nonsteroidals.                           - Await pathology results.                           - Repeat upper endoscopy for surveillance based on                            pathology results.                           -  Return to GI clinic in 12 weeks. Lynann Bolognaajesh Levonne Carreras, MD 04/17/2018 10:21:35 AM This report has been signed electronically.

## 2018-04-17 NOTE — Progress Notes (Signed)
Report to PACU, RN, vss, BBS= Clear.  

## 2018-04-17 NOTE — Patient Instructions (Signed)
Discharge instructions given. Prescription sent to pharmacy. Handouts on a hiatal hernia and gastritis. Resume previous medications. YOU HAD AN ENDOSCOPIC PROCEDURE TODAY AT THE [facility]:   Refer to the procedure report that was given to you for any specific questions about what was found during the examination.  If the procedure report does not answer your questions, please call your gastroenterologist to clarify.  If you requested that your care partner not be given the details of your procedure findings, then the procedure report has been included in a sealed envelope for you to review at your convenience later.  YOU SHOULD EXPECT: Some feelings of bloating in the abdomen. Passage of more gas than usual.  Walking can help get rid of the air that was put into your GI tract during the procedure and reduce the bloating. If you had a lower endoscopy (such as a colonoscopy or flexible sigmoidoscopy) you may notice spotting of blood in your stool or on the toilet paper. If you underwent a bowel prep for your procedure, you may not have a normal bowel movement for a few days.  Please Note:  You might notice some irritation and congestion in your nose or some drainage.  This is from the oxygen used during your procedure.  There is no need for concern and it should clear up in a day or so.  SYMPTOMS TO REPORT IMMEDIATELY:    Following upper endoscopy (EGD)  Vomiting of blood or coffee ground material  New chest pain or pain under the shoulder blades  Painful or persistently difficult swallowing  New shortness of breath  Fever of 100F or higher  Black, tarry-looking stools  For urgent or emergent issues, a gastroenterologist can be reached at any hour by calling [phone].   DIET:  We do recommend a small meal at first, but then you may proceed to your regular diet.  Drink plenty of fluids but you should avoid alcoholic beverages for 24 hours.  ACTIVITY:  You should plan to  take it easy for the rest of today and you should NOT DRIVE or use heavy machinery until tomorrow (because of the sedation medicines used during the test).    FOLLOW UP: Our staff will call the number listed on your records the next business day following your procedure to check on you and address any questions or concerns that you may have regarding the information given to you following your procedure. If we do not reach you, we will leave a message.  However, if you are feeling well and you are not experiencing any problems, there is no need to return our call.  We will assume that you have returned to your regular daily activities without incident.  If any biopsies were taken you will be contacted by phone or by letter within the next 1-3 weeks.  Please call us at [phone] if you have not heard about the biopsies in 3 weeks.    SIGNATURES/CONFIDENTIALITY: You and/or your care partner have signed paperwork which will be entered into your electronic medical record.  These signatures attest to the fact that that the information above on your After Visit Summary has been reviewed and is understood.  Full responsibility of the confidentiality of this discharge information lies with you and/or your care-partner.

## 2018-04-18 ENCOUNTER — Telehealth: Payer: Self-pay | Admitting: *Deleted

## 2018-04-18 NOTE — Telephone Encounter (Signed)
  Follow up Call-  Call back number 04/17/2018  Post procedure Call Back phone  # [phone]  Permission to leave phone message Yes  Some recent data might be hidden     Patient questions:  Do you have a fever, pain , or abdominal swelling? No. Pain Score  0 *  Have you tolerated food without any problems? Yes.    Have you been able to return to your normal activities? Yes.    Do you have any questions about your discharge instructions: Diet   No. Medications  No. Follow up visit  No.  Do you have questions or concerns about your Care? No.  Actions: * If pain score is 4 or above: No action needed, pain <4.  *patient stated her stomach was a little sore but able to eat/drink ok.  She feels like it's easing but is aware to call back if it becomes worse.  No further questions.

## 2018-04-20 ENCOUNTER — Encounter: Payer: Self-pay | Admitting: Gastroenterology

## 2018-05-16 ENCOUNTER — Telehealth: Payer: Self-pay | Admitting: Neurology

## 2018-05-16 ENCOUNTER — Other Ambulatory Visit: Payer: Self-pay | Admitting: Neurology

## 2018-05-16 MED ORDER — LAMOTRIGINE 100 MG PO TABS: 100.0000 mg | ORAL_TABLET | Freq: Two times a day (BID) | ORAL | 10 refills | Status: DC

## 2018-05-16 NOTE — Telephone Encounter (Signed)
Refill sent for the patient.  

## 2018-05-16 NOTE — Telephone Encounter (Signed)
Pt request new script for lamoTRIgine (LAMICTAL) 100 MG tablet in am and pm sent to [facility] [employer]

## 2018-06-18 ENCOUNTER — Telehealth: Payer: Self-pay

## 2018-06-18 NOTE — Telephone Encounter (Signed)
Spoke with the patient and she gave a verbal consent to do a webex visit and to file her insurance. E-mail has been confirmed and sent.

## 2018-06-20 ENCOUNTER — Encounter: Payer: Self-pay | Admitting: Neurology

## 2018-06-20 ENCOUNTER — Ambulatory Visit (INDEPENDENT_AMBULATORY_CARE_PROVIDER_SITE_OTHER): Payer: Medicare HMO | Admitting: Neurology

## 2018-06-20 ENCOUNTER — Other Ambulatory Visit: Payer: Self-pay

## 2018-06-20 DIAGNOSIS — Z5181 Encounter for therapeutic drug level monitoring: Secondary | ICD-10-CM | POA: Diagnosis not present

## 2018-06-20 DIAGNOSIS — G40B19 Juvenile myoclonic epilepsy, intractable, without status epilepticus: Secondary | ICD-10-CM

## 2018-06-20 NOTE — Progress Notes (Signed)
Virtual Visit via Video Note  I connected with Traci [name] on 06/20/18 at 10:45 AM EDT by a video enabled telemedicine application and verified that I am speaking with the correct person using two identifiers.   I discussed the limitations of evaluation and management by telemedicine and the availability of in person appointments. The patient expressed understanding and agreed to proceed.  History of Present Illness: 06/20/2018 SS: [name] is a 43 year old female with history of juvenile myoclonic epilepsy, with a vagal nerve stimulator.  She is currently taking lamotrigine 100 mg twice a day, Topamax 50 mg twice daily. She was previously on Vimpat, had to be switched, Medicaid did not pay for Vimpat.  She reports she is tolerating lamotrigine 100 mg twice a day well.  She reports she continues to have about 3 seizures a month, around the time of her menstrual cycle.  She does state that she felt jerky, she describes this as her seizure aura, however denies seizure event yesterday or today.  Since having the vagal nerve stimulator she reports she has been doing well, she used to have more than 15 seizures a month, now is greatly decreased around 3 seizures a month. She reports bright lights, lack of sleep is a seizure trigger for her.  She denies any falls, she reports occasionally she will feel off balance.  She does not drive.  She does currently take Ambien at night for sleep.  03-21-2018 : Traci [name] is a 43  y.o. female here as a referral for her PMHx juvenile myoclonic epilepsy s/p VNS. Migraines were daily and  Are nowimproved  This is an 80% reduction in migraines.  There is an over 90 % reduction in seizures.  [name] had been followed by the [facility], Dr. Arville Go for 18 months before she came to [place] in 2015-2016.  Here she had also SV procedure that for a while controlled migraines under the guidance of Dr. Naomie Dean.  We have tried  multiple medications and decided on the VNS stimulator.  She has had good experiences with the VNS stimulator and while having had up to 30 spells per month she is now down to 2 or 3 usually at the beginning of her menstrual cycle. Another reduction in stress was her final acceptance for disability, based on her epilepsy syndrome.    We are interrogating the device -03-21-2018, The patient is using an aspire SR 106 model implanted on 03 August 2014 current delivered is 1500 milliampere signal frequency is 20 Hz, pulse width is 250 s, signal on time 30 seconds signal off time 3 minutes., impedance values 2744 ohm. Out of stimulation output current is 1.6 mA pulse width and microseconds 250 signal on time and seconds 60.  Magnet output current is 1.75 mA, pulse width 250 s, signal on time 60 seconds. 75% battery life- up to 5.4 years.   No changes needed. Rv in 12 month   Observations/Objective: Alert, answers questions appropriately, follows commands, once during exam, noticed voice get low, her VNS was activated, facial symmetry noted, symmetric eye movements, no arm drift, gait is intact  Assessment and Plan: 1.  Epilepsy, with vagal nerve stimulator  She was switched at her last visit from Vimpat to lamotrigine, titrating to 100 mg twice a day.  She reports she is tolerating medication well.  She continues to have about 3 seizure episodes per month around her menstrual cycle.  She says the  vagal nerve stimulator has been very beneficial for her.  I will check lab work today to include CBC, CMP, lamotrigine level.  She will come to the office within the next week or so to have blood done.  Once labs result, will review blood work, make necessary medication adjustment as needed, will discuss with Dr. Vickey Hugerohmeier.   Follow Up Instructions: 6 months, she had appointment with Dr. Vickey Hugerohmeier 03/21/18 (per note needs RV with Dr. Vickey Hugerohmeier 1 year from that date)   I discussed the assessment and treatment  plan with the patient. The patient was provided an opportunity to ask questions and all were answered. The patient agreed with the plan and demonstrated an understanding of the instructions.   The patient was advised to call back or seek an in-person evaluation if the symptoms worsen or if the condition fails to improve as anticipated.  I provided 30 minutes of non-face-to-face time during this encounter.   Otila KluverSarah Analeigh Aries, AGNP-C, DNP  [facility] [address], [address] [place], [place] [address] [phone]

## 2018-06-27 ENCOUNTER — Other Ambulatory Visit: Payer: Self-pay

## 2018-06-27 ENCOUNTER — Other Ambulatory Visit (INDEPENDENT_AMBULATORY_CARE_PROVIDER_SITE_OTHER): Payer: Self-pay

## 2018-06-27 DIAGNOSIS — Z5181 Encounter for therapeutic drug level monitoring: Secondary | ICD-10-CM

## 2018-06-27 DIAGNOSIS — G40B19 Juvenile myoclonic epilepsy, intractable, without status epilepticus: Secondary | ICD-10-CM

## 2018-06-27 DIAGNOSIS — Z0289 Encounter for other administrative examinations: Secondary | ICD-10-CM

## 2018-06-29 LAB — CBC WITH DIFFERENTIAL/PLATELET
Basophils Absolute: 0.1 10*3/uL (ref 0.0–0.2)
Basos: 1 %
EOS (ABSOLUTE): 0.2 10*3/uL (ref 0.0–0.4)
Eos: 2 %
Hematocrit: 43.1 % (ref 34.0–46.6)
Hemoglobin: 14.5 g/dL (ref 11.1–15.9)
Immature Grans (Abs): 0 10*3/uL (ref 0.0–0.1)
Immature Granulocytes: 0 %
Lymphocytes Absolute: 2.4 10*3/uL (ref 0.7–3.1)
Lymphs: 33 %
MCH: 30.5 pg (ref 26.6–33.0)
MCHC: 33.6 g/dL (ref 31.5–35.7)
MCV: 91 fL (ref 79–97)
Monocytes Absolute: 0.7 10*3/uL (ref 0.1–0.9)
Monocytes: 9 %
Neutrophils Absolute: 4 10*3/uL (ref 1.4–7.0)
Neutrophils: 55 %
Platelets: 365 10*3/uL (ref 150–450)
RBC: 4.76 x10E6/uL (ref 3.77–5.28)
RDW: 12.3 % (ref 11.7–15.4)
WBC: 7.4 10*3/uL (ref 3.4–10.8)

## 2018-06-29 LAB — COMPREHENSIVE METABOLIC PANEL
ALT: 17 IU/L (ref 0–32)
AST: 15 IU/L (ref 0–40)
Albumin/Globulin Ratio: 1.7 (ref 1.2–2.2)
Albumin: 4.3 g/dL (ref 3.8–4.8)
Alkaline Phosphatase: 71 IU/L (ref 39–117)
BUN/Creatinine Ratio: 21 (ref 9–23)
BUN: 21 mg/dL (ref 6–24)
Bilirubin Total: <0.2 mg/dL (ref 0.0–1.2)
CO2: 18 mmol/L — ABNORMAL LOW (ref 20–29)
Calcium: 9.8 mg/dL (ref 8.7–10.2)
Chloride: 107 mmol/L — ABNORMAL HIGH (ref 96–106)
Creatinine, Ser: 1 mg/dL (ref 0.57–1.00)
GFR calc Af Amer: 81 mL/min/{1.73_m2} (ref >59)
GFR calc non Af Amer: 70 mL/min/{1.73_m2} (ref >59)
Globulin, Total: 2.5 g/dL (ref 1.5–4.5)
Glucose: 95 mg/dL (ref 65–99)
Potassium: 4.2 mmol/L (ref 3.5–5.2)
Sodium: 142 mmol/L (ref 134–144)
Total Protein: 6.8 g/dL (ref 6.0–8.5)

## 2018-06-29 LAB — LAMOTRIGINE LEVEL: Lamotrigine Lvl: 5.5 ug/mL (ref 2.0–20.0)

## 2018-06-30 ENCOUNTER — Other Ambulatory Visit: Payer: Self-pay | Admitting: Neurology

## 2018-07-03 ENCOUNTER — Telehealth: Payer: Self-pay | Admitting: Neurology

## 2018-07-03 MED ORDER — LAMOTRIGINE 100 MG PO TABS: ORAL_TABLET | ORAL | 10 refills | Status: DC

## 2018-07-03 NOTE — Telephone Encounter (Signed)
Please call the patient and let her know that her lab work was unremarkable.  Her lamotrigine level was 5.5.  At her last visit she reported she continued to have about 3 seizures per month, has improved with the VNS and Lamictal.  We will try to increase her Lamictal, taking 100 mg in the morning, 150 mg at bedtime. I have sent in the refill. Have her follow-up in 4-6 months for revisit with [facility], for VNS check, last done 03/21/2018 interrogated but not reset.

## 2018-07-03 NOTE — Telephone Encounter (Signed)
Spoke with the patient and she has verbalized understanding her results. She has been scheduled with Megan on 01/08/2019 at 8:30 am. No other questions or concerns at this time.

## 2018-07-18 ENCOUNTER — Telehealth: Payer: Self-pay | Admitting: Neurology

## 2018-07-18 NOTE — Telephone Encounter (Signed)
Looks like patient's VNS was last interrogated Jan. 2020 by Dr. Vickey Huger. I saw her April 2020 for virtual visit, VNS not interrogated. She needs to be seen every 6 months for VNS interrogation. Can you have her schedule an appointment for this around July 2020.

## 2018-07-18 NOTE — Telephone Encounter (Signed)
LMVM for pt that she needs to be seen in 7/20 for VNS interrogation with MM/NP, and not 11/20.  May make this appt when she calls back.

## 2018-07-19 NOTE — Telephone Encounter (Signed)
LMVM for pt to return call to change appt from 12/2018 to 08/2018 with Butch Penny, NP for VNS.

## 2018-07-24 ENCOUNTER — Encounter: Payer: Self-pay | Admitting: *Deleted

## 2018-07-24 ENCOUNTER — Telehealth: Payer: Self-pay | Admitting: *Deleted

## 2018-07-24 NOTE — Telephone Encounter (Signed)
Sent mychart message about changing appt since had not heard back by phone.

## 2018-08-08 ENCOUNTER — Encounter: Payer: Self-pay | Admitting: *Deleted

## 2018-08-08 NOTE — Telephone Encounter (Signed)
Received notice she has not read [employer].  Mailed letter.

## 2018-12-10 DIAGNOSIS — R1032 Left lower quadrant pain: Secondary | ICD-10-CM | POA: Diagnosis not present

## 2018-12-10 DIAGNOSIS — N3091 Cystitis, unspecified with hematuria: Secondary | ICD-10-CM | POA: Diagnosis not present

## 2018-12-13 DIAGNOSIS — R1032 Left lower quadrant pain: Secondary | ICD-10-CM | POA: Diagnosis not present

## 2018-12-13 DIAGNOSIS — R109 Unspecified abdominal pain: Secondary | ICD-10-CM | POA: Diagnosis not present

## 2019-01-08 ENCOUNTER — Other Ambulatory Visit: Payer: Self-pay

## 2019-01-08 ENCOUNTER — Encounter: Payer: Self-pay | Admitting: Adult Health

## 2019-01-08 ENCOUNTER — Ambulatory Visit: Payer: Medicare HMO | Admitting: Adult Health

## 2019-01-08 VITALS — BP 127/72 | HR 109 | Temp 97.6°F | Ht 62.0 in | Wt 242.0 lb

## 2019-01-08 DIAGNOSIS — G40B19 Juvenile myoclonic epilepsy, intractable, without status epilepticus: Secondary | ICD-10-CM

## 2019-01-08 DIAGNOSIS — Z5181 Encounter for therapeutic drug level monitoring: Secondary | ICD-10-CM

## 2019-01-08 NOTE — Progress Notes (Signed)
PATIENT: Traci [name] DOB: 14-Aug-1975  REASON FOR VISIT: follow up HISTORY FROM: patient  HISTORY OF PRESENT ILLNESS: Today 01/08/19:  Traci [name] is a 43 year old female with a history of seizures.  She returns today for follow-up.  She is currently on Lamictal 100 mg in the morning and 150 mg in the evening.  She is also on Topamax 50 mg twice a day.  She also has a vagal nerve stimulator.  She reports that she always has seizures around her menstrual cycle.  She states that typically 7 days before she will start feeling different.  And then she may have 3 days during her cycle that she has events.  These are typically grand mal seizures.  She states the length of the seizure can vary from 30 secs to 2 min. She reports that she is tolerating her medication well.  She does not operate a motor vehicle.  She is able to complete all ADLs independently.  She returns today for an evaluation.  HISTORY 06/20/2018 SS: Traci [name] is a 43 year old female with history of juvenile myoclonic epilepsy, with a vagal nerve stimulator.  She is currently taking lamotrigine 100 mg twice a day, Topamax 50 mg twice daily. She was previously on Vimpat, had to be switched, Medicaid did not pay for Vimpat.  She reports she is tolerating lamotrigine 100 mg twice a day well.  She reports she continues to have about 3 seizures a month, around the time of her menstrual cycle.  She does state that she felt jerky, she describes this as her seizure aura, however denies seizure event yesterday or today.  Since having the vagal nerve stimulator she reports she has been doing well, she used to have more than 15 seizures a month, now is greatly decreased around 3 seizures a month. She reports bright lights, lack of sleep is a seizure trigger for her.  She denies any falls, she reports occasionally she will feel off balance.  She does not drive.  She does currently take Ambien at night for sleep.  REVIEW OF SYSTEMS: Out of a  complete 14 system review of symptoms, the patient complains only of the following symptoms, and all other reviewed systems are negative.  See HPI  ALLERGIES: No Known Allergies  HOME MEDICATIONS: Outpatient Medications Prior to Visit  Medication Sig Dispense Refill  . lamoTRIgine (LAMICTAL) 100 MG tablet Take 1 tablet (100 mg) in the morning, take 1.5 tablets in the evening (150 mg) 90 tablet 10  . pantoprazole (PROTONIX) 40 MG tablet Take 1 tablet (40 mg total) by mouth 2 (two) times daily before a meal. 60 tablet 6  . promethazine (PHENERGAN) 25 MG tablet Take 1 tablet (25 mg total) by mouth every 8 (eight) hours as needed for nausea or vomiting. 90 tablet 1  . topiramate (TOPAMAX) 100 MG tablet TAKE 1/2 TABLET(50 MG) BY MOUTH TWICE DAILY 30 tablet 11  . zolpidem (AMBIEN) 10 MG tablet Take 1 tablet (10 mg total) by mouth at bedtime as needed for up to 30 days for sleep. 30 tablet 0   No facility-administered medications prior to visit.     PAST MEDICAL HISTORY: Past Medical History:  Diagnosis Date  . GERD (gastroesophageal reflux disease)   . Headache   . History of kidney stones    passed stones-no surgery  . Interstitial cystitis   . Seizures ([place])    last one 02/2018 - on new medication    PAST SURGICAL HISTORY: Past  Surgical History:  Procedure Laterality Date  . APPENDECTOMY    . CESAREAN SECTION     x 2  . CHOLECYSTECTOMY    . COLONOSCOPY     normal  . KNEE SURGERY Right   . LAPAROSCOPY     endometriosis  . lipotripsy     x 1 - kidney stone removal with stent  . TUBAL LIGATION    . UPPER GASTROINTESTINAL ENDOSCOPY    . VAGUS NERVE STIMULATOR INSERTION N/A 07/04/2014   Procedure: VAGAL NERVE STIMULATOR IMPLANT;  Surgeon: Lisbeth Renshaw, MD;  Location: [facility];  Service: Neurosurgery;  Laterality: N/A;    FAMILY HISTORY: Family History  Problem Relation Age of Onset  . Cancer Mother   . Diabetes Father   . Cancer Maternal Grandmother   . Cancer  Maternal Grandfather   . Diabetes Paternal Grandmother   . Diabetes Paternal Grandfather   . Migraines Neg Hx   . Colon cancer Neg Hx   . Rectal cancer Neg Hx   . Stomach cancer Neg Hx   . Esophageal cancer Neg Hx     SOCIAL HISTORY: Social History   Socioeconomic History  . Marital status: Married    Spouse name: [name]  . Number of children: 2  . Years of education: Some colle  . Highest education level: Not on file  Occupational History  . Not on file  Social Needs  . Financial resource strain: Not on file  . Food insecurity    Worry: Not on file    Inability: Not on file  . Transportation needs    Medical: Not on file    Non-medical: Not on file  Tobacco Use  . Smoking status: Former Smoker    Types: Cigarettes    Quit date: 04/17/2006    Years since quitting: 12.7  . Smokeless tobacco: Never Used  Substance and Sexual Activity  . Alcohol use: No    Alcohol/week: 0.0 standard drinks  . Drug use: No  . Sexual activity: Yes    Birth control/protection: Surgical    Comment: Tubal Ligation  Lifestyle  . Physical activity    Days per week: Not on file    Minutes per session: Not on file  . Stress: Not on file  Relationships  . Social Musician on phone: Not on file    Gets together: Not on file    Attends religious service: Not on file    Active member of club or organization: Not on file    Attends meetings of clubs or organizations: Not on file    Relationship status: Not on file  . Intimate partner violence    Fear of current or ex partner: Not on file    Emotionally abused: Not on file    Physically abused: Not on file    Forced sexual activity: Not on file  Other Topics Concern  . Not on file  Social History Narrative   Lives at home with husband, [name]   Daily caffeine consumption- 1 cup      PHYSICAL EXAM  Vitals:   01/08/19 0820  BP: 127/72  Pulse: (!) 109  Temp: 97.6 F (36.4 C)  TempSrc: Oral  Weight: 242 lb (109.8 kg)   Height: 5\' 2"  (1.575 m)   Body mass index is 44.26 kg/m.  Generalized: Well developed, in no acute distress   Neurological examination  Mentation: Alert oriented to time, place, history taking. Follows all commands speech and language  fluent Cranial nerve II-XII: Pupils were equal round reactive to light. Extraocular movements were full, visual field were full on confrontational test. Head turning and shoulder shrug  were normal and symmetric. Motor: The motor testing reveals 5 over 5 strength of all 4 extremities. Good symmetric motor tone is noted throughout.  Sensory: Sensory testing is intact to soft touch on all 4 extremities. No evidence of extinction is noted.  Coordination: Cerebellar testing reveals good finger-nose-finger and heel-to-shin bilaterally.  Gait and station: Gait is normal Reflexes: Deep tendon reflexes are symmetric and normal bilaterally.   DIAGNOSTIC DATA (LABS, IMAGING, TESTING) - I reviewed patient records, labs, notes, testing and imaging myself where available.  Lab Results  Component Value Date   WBC 7.4 06/27/2018   HGB 14.5 06/27/2018   HCT 43.1 06/27/2018   MCV 91 06/27/2018   PLT 365 06/27/2018      Component Value Date/Time   NA 142 06/27/2018 1010   K 4.2 06/27/2018 1010   CL 107 (H) 06/27/2018 1010   CO2 18 (L) 06/27/2018 1010   GLUCOSE 95 06/27/2018 1010   GLUCOSE 97 03/14/2018 2138   BUN 21 06/27/2018 1010   CREATININE 1.00 06/27/2018 1010   CALCIUM 9.8 06/27/2018 1010   PROT 6.8 06/27/2018 1010   ALBUMIN 4.3 06/27/2018 1010   AST 15 06/27/2018 1010   ALT 17 06/27/2018 1010   ALKPHOS 71 06/27/2018 1010   BILITOT <0.2 06/27/2018 1010   GFRNONAA 70 06/27/2018 1010   GFRAA 81 06/27/2018 1010   No results found for: CHOL, HDL, LDLCALC, LDLDIRECT, TRIG, CHOLHDL No results found for: ZOXW9UHGBA1C No results found for: VITAMINB12 Lab Results  Component Value Date   TSH 1.260 05/08/2014      ASSESSMENT AND PLAN 43 y.o. year old  female  has a past medical history of GERD (gastroesophageal reflux disease), Headache, History of kidney stones, Interstitial cystitis, and Seizures ([facility]). here with:  Seizures  -Continue Lamictal 100 mg in the morning and 150 mg in the evening -Continue Topamax 50 mg twice a day - Lamictal Level today- depending on level will consider increasing Lamictal to 150 mg BID.  - Encouraged her to swipe magnet multiple times during the day before her menstrual cycle and during. VNS rep (meagan) also recommended this.  -Seizure precautions discussed -VNS interrogated no settings changed  Overall the patient has remained stable.  She is advised that if she has any seizure events she should let us know.  She will follow-up in 6 months or sooner if needed.  VNS interrogation:  Output 1.5 mA Single frequency 20 Hz Pulse width 250 Usec Signal on time 30 seconds Signal off 3 minutes  Magnet Output 1.75 mA Pulse width 250 Usec Signal on time 60 seconds  AutoStim Output 1.65 mA Pulse width 250 Usec Signal on time 60 seconds   Butch PennyMegan Sahid Borba, MSN, NP-C 01/08/2019, 8:31 AM [facility] [address], [address] [place], [place] [address] [phone]

## 2019-01-08 NOTE — Patient Instructions (Signed)
Your Plan:  Continue lamictal and topamax Blood work today May increase lamictal depending on levels If your symptoms worsen or you develop new symptoms please let us know.       Thank you for coming to see [place] at [facility]. I hope we have been able to provide you high quality care today.  You may receive a patient satisfaction survey over the next few weeks. We would appreciate your feedback and comments so that we may continue to improve ourselves and the health of our patients.

## 2019-01-11 LAB — CBC WITH DIFFERENTIAL/PLATELET
Basophils Absolute: 0.1 10*3/uL (ref 0.0–0.2)
Basos: 1 %
EOS (ABSOLUTE): 0.2 10*3/uL (ref 0.0–0.4)
Eos: 2 %
Hematocrit: 41.8 % (ref 34.0–46.6)
Hemoglobin: 13.8 g/dL (ref 11.1–15.9)
Immature Grans (Abs): 0 10*3/uL (ref 0.0–0.1)
Immature Granulocytes: 0 %
Lymphocytes Absolute: 2.1 10*3/uL (ref 0.7–3.1)
Lymphs: 29 %
MCH: 30 pg (ref 26.6–33.0)
MCHC: 33 g/dL (ref 31.5–35.7)
MCV: 91 fL (ref 79–97)
Monocytes Absolute: 0.8 10*3/uL (ref 0.1–0.9)
Monocytes: 11 %
Neutrophils Absolute: 4.1 10*3/uL (ref 1.4–7.0)
Neutrophils: 57 %
Platelets: 304 10*3/uL (ref 150–450)
RBC: 4.6 x10E6/uL (ref 3.77–5.28)
RDW: 12.8 % (ref 11.7–15.4)
WBC: 7.2 10*3/uL (ref 3.4–10.8)

## 2019-01-11 LAB — COMPREHENSIVE METABOLIC PANEL
ALT: 25 IU/L (ref 0–32)
AST: 19 IU/L (ref 0–40)
Albumin/Globulin Ratio: 1.9 (ref 1.2–2.2)
Albumin: 4.4 g/dL (ref 3.8–4.8)
Alkaline Phosphatase: 72 IU/L (ref 39–117)
BUN/Creatinine Ratio: 11 (ref 9–23)
BUN: 11 mg/dL (ref 6–24)
Bilirubin Total: 0.4 mg/dL (ref 0.0–1.2)
CO2: 19 mmol/L — ABNORMAL LOW (ref 20–29)
Calcium: 9.3 mg/dL (ref 8.7–10.2)
Chloride: 106 mmol/L (ref 96–106)
Creatinine, Ser: 1 mg/dL (ref 0.57–1.00)
GFR calc Af Amer: 81 mL/min/{1.73_m2} (ref >59)
GFR calc non Af Amer: 70 mL/min/{1.73_m2} (ref >59)
Globulin, Total: 2.3 g/dL (ref 1.5–4.5)
Glucose: 92 mg/dL (ref 65–99)
Potassium: 4.1 mmol/L (ref 3.5–5.2)
Sodium: 138 mmol/L (ref 134–144)
Total Protein: 6.7 g/dL (ref 6.0–8.5)

## 2019-01-11 LAB — LAMOTRIGINE LEVEL: Lamotrigine Lvl: 10.9 ug/mL (ref 2.0–20.0)

## 2019-01-15 ENCOUNTER — Telehealth: Payer: Self-pay

## 2019-01-15 NOTE — Telephone Encounter (Signed)
Spoke with the patient and she verbalized understanding her results. No questions or concerns at this time.   

## 2019-01-15 NOTE — Telephone Encounter (Signed)
-----   Message from Ward Givens, NP sent at 01/14/2019 10:39 AM EST ----- Labs results are unremarkable. Please call patient with results.

## 2019-03-01 DIAGNOSIS — U071 COVID-19: Secondary | ICD-10-CM

## 2019-03-01 HISTORY — DX: COVID-19: U07.1

## 2019-05-17 ENCOUNTER — Other Ambulatory Visit: Payer: Self-pay | Admitting: Neurology

## 2019-07-09 ENCOUNTER — Other Ambulatory Visit: Payer: Self-pay

## 2019-07-09 ENCOUNTER — Encounter: Payer: Self-pay | Admitting: Adult Health

## 2019-07-09 ENCOUNTER — Ambulatory Visit: Payer: Medicare HMO | Admitting: Adult Health

## 2019-07-09 VITALS — BP 108/66 | HR 77 | Temp 97.5°F | Ht 62.0 in | Wt 217.0 lb

## 2019-07-09 DIAGNOSIS — Z5181 Encounter for therapeutic drug level monitoring: Secondary | ICD-10-CM

## 2019-07-09 DIAGNOSIS — G40B19 Juvenile myoclonic epilepsy, intractable, without status epilepticus: Secondary | ICD-10-CM | POA: Diagnosis not present

## 2019-07-09 MED ORDER — PROMETHAZINE HCL 25 MG PO TABS: 25.0000 mg | ORAL_TABLET | Freq: Three times a day (TID) | ORAL | 1 refills | Status: DC | PRN

## 2019-07-09 MED ORDER — LAMOTRIGINE 150 MG PO TABS: 150.0000 mg | ORAL_TABLET | Freq: Two times a day (BID) | ORAL | 3 refills | Status: DC

## 2019-07-09 NOTE — Patient Instructions (Signed)
Your Plan:  Continue topamax Increase Lamictal 150 mg twice a day If your symptoms worsen or you develop new symptoms please let us know.   Thank you for coming to see [place] at [facility]. I hope we have been able to provide you high quality care today.  You may receive a patient satisfaction survey over the next few weeks. We would appreciate your feedback and comments so that we may continue to improve ourselves and the health of our patients.

## 2019-07-09 NOTE — Progress Notes (Signed)
PATIENT: Traci [name] DOB: 09/15/1975  REASON FOR VISIT: follow up HISTORY FROM: patient  HISTORY OF PRESENT ILLNESS: Today 07/09/19:  Traci [name] is a 44 year old female with a history of intractable seizures.  She returns today for follow-up.  She is currently on Lamictal 100 mg in the morning and 150 in the evening.  She remains on Topamax 50 mg twice a day.  She also has a vagal nerve stimulator.  Reports that she swiped her magnet 3 times a day.  She continues to have seizures around her menstrual cycle.  On average she has approximately 1-2 seizures a month.  Reports that the most she has had is four.  She does not operate a motor vehicle.  She returns today for an evaluation  HISTORY 01/08/19:  Traci [name] is a 44 year old female with a history of seizures.  She returns today for follow-up.  She is currently on Lamictal 100 mg in the morning and 150 mg in the evening.  She is also on Topamax 50 mg twice a day.  She also has a vagal nerve stimulator.  She reports that she always has seizures around her menstrual cycle.  She states that typically 7 days before she will start feeling different.  And then she may have 3 days during her cycle that she has events.  These are typically grand mal seizures.  She states the length of the seizure can vary from 30 secs to 2 min. She reports that she is tolerating her medication well.  She does not operate a motor vehicle.  She is able to complete all ADLs independently.  She returns today for an evaluation REVIEW OF SYSTEMS: Out of a complete 14 system review of symptoms, the patient complains only of the following symptoms, and all other reviewed systems are negative.  See HPI  ALLERGIES: No Known Allergies  HOME MEDICATIONS: Outpatient Medications Prior to Visit  Medication Sig Dispense Refill  . pantoprazole (PROTONIX) 40 MG tablet Take 1 tablet (40 mg total) by mouth 2 (two) times daily before a meal. 60 tablet 6  . topiramate (TOPAMAX)  100 MG tablet TAKE 1/2 TABLET(50 MG) BY MOUTH TWICE DAILY 30 tablet 11  . lamoTRIgine (LAMICTAL) 100 MG tablet TAKE 1 TABLET(100 MG) BY MOUTH TWICE DAILY 60 tablet 2  . promethazine (PHENERGAN) 25 MG tablet Take 1 tablet (25 mg total) by mouth every 8 (eight) hours as needed for nausea or vomiting. 90 tablet 1  . zolpidem (AMBIEN) 10 MG tablet Take 1 tablet (10 mg total) by mouth at bedtime as needed for up to 30 days for sleep. 30 tablet 0   No facility-administered medications prior to visit.    PAST MEDICAL HISTORY: Past Medical History:  Diagnosis Date  . GERD (gastroesophageal reflux disease)   . Headache   . History of kidney stones    passed stones-no surgery  . Interstitial cystitis   . Seizures (HCC)    last one 02/2018 - on new medication    PAST SURGICAL HISTORY: Past Surgical History:  Procedure Laterality Date  . APPENDECTOMY    . CESAREAN SECTION     x 2  . CHOLECYSTECTOMY    . COLONOSCOPY     normal  . KNEE SURGERY Right   . LAPAROSCOPY     endometriosis  . lipotripsy     x 1 - kidney stone removal with stent  . TUBAL LIGATION    . UPPER GASTROINTESTINAL ENDOSCOPY    .  VAGUS NERVE STIMULATOR INSERTION N/A 07/04/2014   Procedure: VAGAL NERVE STIMULATOR IMPLANT;  Surgeon: Consuella Lose, MD;  Location: [facility];  Service: Neurosurgery;  Laterality: N/A;    FAMILY HISTORY: Family History  Problem Relation Age of Onset  . Cancer Mother   . Diabetes Father   . Cancer Maternal Grandmother   . Cancer Maternal Grandfather   . Diabetes Paternal Grandmother   . Diabetes Paternal Grandfather   . Migraines Neg Hx   . Colon cancer Neg Hx   . Rectal cancer Neg Hx   . Stomach cancer Neg Hx   . Esophageal cancer Neg Hx     SOCIAL HISTORY: Social History   Socioeconomic History  . Marital status: Married    Spouse name: Thurmond Butts  . Number of children: 2  . Years of education: Some colle  . Highest education level: Not on file  Occupational History  .  Not on file  Tobacco Use  . Smoking status: Former Smoker    Types: Cigarettes    Quit date: 04/17/2006    Years since quitting: 13.2  . Smokeless tobacco: Never Used  Substance and Sexual Activity  . Alcohol use: No    Alcohol/week: 0.0 standard drinks  . Drug use: No  . Sexual activity: Yes    Birth control/protection: Surgical    Comment: Tubal Ligation  Other Topics Concern  . Not on file  Social History Narrative   Lives at home with husband, Thurmond Butts   Daily caffeine consumption- 1 cup   Social Determinants of Health   Financial Resource Strain:   . Difficulty of Paying Living Expenses:   Food Insecurity:   . Worried About Charity fundraiser in the Last Year:   . Arboriculturist in the Last Year:   Transportation Needs:   . Film/video editor (Medical):   Marland Kitchen Lack of Transportation (Non-Medical):   Physical Activity:   . Days of Exercise per Week:   . Minutes of Exercise per Session:   Stress:   . Feeling of Stress :   Social Connections:   . Frequency of Communication with Friends and Family:   . Frequency of Social Gatherings with Friends and Family:   . Attends Religious Services:   . Active Member of Clubs or Organizations:   . Attends Archivist Meetings:   Marland Kitchen Marital Status:   Intimate Partner Violence:   . Fear of Current or Ex-Partner:   . Emotionally Abused:   Marland Kitchen Physically Abused:   . Sexually Abused:       PHYSICAL EXAM  Vitals:   07/09/19 0832  BP: 108/66  Pulse: 77  Temp: (!) 97.5 F (36.4 C)  Weight: 217 lb (98.4 kg)  Height: 5\' 2"  (1.575 m)   Body mass index is 39.69 kg/m.  Generalized: Well developed, in no acute distress   Neurological examination  Mentation: Alert oriented to time, place, history taking. Follows all commands speech and language fluent Cranial nerve II-XII: Pupils were equal round reactive to light. Extraocular movements were full, visual field were full on confrontational test.  Head turning and  shoulder shrug  were normal and symmetric. Motor: The motor testing reveals 5 over 5 strength of all 4 extremities. Good symmetric motor tone is noted throughout.  Sensory: Sensory testing is intact to soft touch on all 4 extremities. No evidence of extinction is noted.  Coordination: Cerebellar testing reveals good finger-nose-finger and heel-to-shin bilaterally.  Gait and station: Gait  is normal.  Reflexes: Deep tendon reflexes are symmetric and normal bilaterally.   DIAGNOSTIC DATA (LABS, IMAGING, TESTING) - I reviewed patient records, labs, notes, testing and imaging myself where available.  Lab Results  Component Value Date   WBC 7.2 01/08/2019   HGB 13.8 01/08/2019   HCT 41.8 01/08/2019   MCV 91 01/08/2019   PLT 304 01/08/2019      Component Value Date/Time   NA 138 01/08/2019 0938   K 4.1 01/08/2019 0938   CL 106 01/08/2019 0938   CO2 19 (L) 01/08/2019 0938   GLUCOSE 92 01/08/2019 0938   GLUCOSE 97 03/14/2018 2138   BUN 11 01/08/2019 0938   CREATININE 1.00 01/08/2019 0938   CALCIUM 9.3 01/08/2019 0938   PROT 6.7 01/08/2019 0938   ALBUMIN 4.4 01/08/2019 0938   AST 19 01/08/2019 0938   ALT 25 01/08/2019 0938   ALKPHOS 72 01/08/2019 0938   BILITOT 0.4 01/08/2019 0938   GFRNONAA 70 01/08/2019 0938   GFRAA 81 01/08/2019 0938    Lab Results  Component Value Date   TSH 1.260 05/08/2014      ASSESSMENT AND PLAN 44 y.o. year old female  has a past medical history of GERD (gastroesophageal reflux disease), Headache, History of kidney stones, Interstitial cystitis, and Seizures (HCC). here with:  1.  Seizures  -Continue Topamax 50 mg twice a day -Increase Lamictal to 150 milligrams twice a day -Blood work today-CBC, CMP, Lamictal level -Continue to swipe magnet for VNS: Prior to and during her menstrual cycle -If your seizure frequency increases or you develop new symptoms please let us know -Follow-up in 6 months or sooner if needed   I spent 20 minutes of  face-to-face and non-face-to-face time with patient.  This included previsit chart review, lab review, study review, order entry, electronic health record documentation, patient education.  Butch Penny, MSN, NP-C 07/09/2019, 8:57 AM [facility] [address], [address] [place], [place] [address] [phone]

## 2019-07-10 LAB — CBC WITH DIFFERENTIAL/PLATELET
Basophils Absolute: 0.1 10*3/uL (ref 0.0–0.2)
Basos: 1 %
EOS (ABSOLUTE): 0.1 10*3/uL (ref 0.0–0.4)
Eos: 2 %
Hematocrit: 43.8 % (ref 34.0–46.6)
Hemoglobin: 14.2 g/dL (ref 11.1–15.9)
Immature Grans (Abs): 0 10*3/uL (ref 0.0–0.1)
Immature Granulocytes: 0 %
Lymphocytes Absolute: 2.4 10*3/uL (ref 0.7–3.1)
Lymphs: 36 %
MCH: 30.7 pg (ref 26.6–33.0)
MCHC: 32.4 g/dL (ref 31.5–35.7)
MCV: 95 fL (ref 79–97)
Monocytes Absolute: 0.6 10*3/uL (ref 0.1–0.9)
Monocytes: 9 %
Neutrophils Absolute: 3.5 10*3/uL (ref 1.4–7.0)
Neutrophils: 52 %
Platelets: 320 10*3/uL (ref 150–450)
RBC: 4.63 x10E6/uL (ref 3.77–5.28)
RDW: 12.7 % (ref 11.7–15.4)
WBC: 6.7 10*3/uL (ref 3.4–10.8)

## 2019-07-10 LAB — COMPREHENSIVE METABOLIC PANEL
ALT: 12 IU/L (ref 0–32)
AST: 14 IU/L (ref 0–40)
Albumin/Globulin Ratio: 1.9 (ref 1.2–2.2)
Albumin: 4.4 g/dL (ref 3.8–4.8)
Alkaline Phosphatase: 101 IU/L (ref 39–117)
BUN/Creatinine Ratio: 26 — ABNORMAL HIGH (ref 9–23)
BUN: 23 mg/dL (ref 6–24)
Bilirubin Total: 0.3 mg/dL (ref 0.0–1.2)
CO2: 21 mmol/L (ref 20–29)
Calcium: 9.3 mg/dL (ref 8.7–10.2)
Chloride: 105 mmol/L (ref 96–106)
Creatinine, Ser: 0.87 mg/dL (ref 0.57–1.00)
GFR calc Af Amer: 95 mL/min/{1.73_m2} (ref >59)
GFR calc non Af Amer: 82 mL/min/{1.73_m2} (ref >59)
Globulin, Total: 2.3 g/dL (ref 1.5–4.5)
Glucose: 92 mg/dL (ref 65–99)
Potassium: 3.9 mmol/L (ref 3.5–5.2)
Sodium: 140 mmol/L (ref 134–144)
Total Protein: 6.7 g/dL (ref 6.0–8.5)

## 2019-07-10 LAB — LAMOTRIGINE LEVEL: Lamotrigine Lvl: 4.6 ug/mL (ref 2.0–20.0)

## 2019-07-10 NOTE — Progress Notes (Signed)
Labs results are unremarkable. Please call patient with results.

## 2019-07-11 ENCOUNTER — Telehealth: Payer: Self-pay | Admitting: *Deleted

## 2019-07-11 NOTE — Telephone Encounter (Signed)
-----   Message from Megan Millikan, NP sent at 07/10/2019  4:42 PM EDT ----- Labs results are unremarkable. Please call patient with results.    

## 2019-07-11 NOTE — Telephone Encounter (Signed)
-----   Message from Butch Penny, NP sent at 07/10/2019  4:42 PM EDT ----- Labs results are unremarkable. Please call patient with results.

## 2019-09-04 ENCOUNTER — Other Ambulatory Visit: Payer: Self-pay | Admitting: Gastroenterology

## 2019-09-04 NOTE — Telephone Encounter (Signed)
Pt is checking on the status of the refill request her pharmacy submitted for her Protonix.

## 2019-09-23 ENCOUNTER — Other Ambulatory Visit: Payer: Self-pay | Admitting: Neurology

## 2019-10-29 ENCOUNTER — Encounter: Payer: Self-pay | Admitting: Gastroenterology

## 2019-10-29 ENCOUNTER — Ambulatory Visit: Payer: Medicare HMO | Admitting: Gastroenterology

## 2019-10-29 VITALS — BP 120/82 | HR 86 | Ht 62.0 in | Wt 219.2 lb

## 2019-10-29 DIAGNOSIS — K449 Diaphragmatic hernia without obstruction or gangrene: Secondary | ICD-10-CM | POA: Diagnosis not present

## 2019-10-29 MED ORDER — PANTOPRAZOLE SODIUM 40 MG PO TBEC: DELAYED_RELEASE_TABLET | ORAL | 11 refills | Status: DC

## 2019-10-29 NOTE — Progress Notes (Signed)
Chief Complaint:   Referring Provider:  Heywood Bene, MD      ASSESSMENT AND PLAN;   #1. 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  No Barrett's on biopsies.    Plan: -Continue Protonix 40mg  po bid #180, 4 refills.  Can try to reduce it to once a day.  Explained long term risks and benefits. -Nonpharmacologic means of reflux control was stressed.  I encouraged her to continue to reduce weight. -Colon at age 44 as per current recommendations. -FU in 1 year.  Earlier, if with any new problems.   HPI:    Traci [name] is a 44 y.o. female  For follow-up visit Feels significantly almost 100% better No odynophagia or dysphagia Has been trying and able to reduce weight. She does not eat late at night. She avoids spicy foods. Has cut down and almost stopped drinking sodas.  No nonsteroidals. Doing extremely well  Here for medication refill.  Denies having any significant diarrhea or constipation.  Would occasionally get constipated.  Drinks plenty of water.   Wt Readings from Last 3 Encounters:  10/29/19 219 lb 4 oz (99.5 kg)  07/09/19 217 lb (98.4 kg)  01/08/19 242 lb (109.8 kg)      Past Medical History:  Diagnosis Date  . GERD (gastroesophageal reflux disease)   . Headache   . History of kidney stones    passed stones-no surgery  . IBS (irritable bowel syndrome)   . Interstitial cystitis   . Seizures (HCC)    last one 02/2018 - on new medication    Past Surgical History:  Procedure Laterality Date  . APPENDECTOMY    . CESAREAN SECTION     x 2  . CHOLECYSTECTOMY    . COLONOSCOPY     normal-2010  . KNEE SURGERY Right   . LAPAROSCOPY     endometriosis  . lipotripsy     x 1 - kidney stone removal with stent  . TUBAL LIGATION    . UPPER GASTROINTESTINAL ENDOSCOPY  12/26/2014   Erosive esophagitis (LA grade B). Small hiatal hernoa. Mild gastritis  . VAGUS NERVE STIMULATOR INSERTION N/A 07/04/2014   Procedure: VAGAL NERVE STIMULATOR IMPLANT;  Surgeon:  09/03/2014, MD;  Location: [facility];  Service: Neurosurgery;  Laterality: N/A;    Family History  Problem Relation Age of Onset  . Cancer Mother   . Diabetes Father   . Cancer Maternal Grandmother   . Cancer Maternal Grandfather   . Diabetes Paternal Grandmother   . Diabetes Paternal Grandfather   . Migraines Neg Hx   . Colon cancer Neg Hx   . Rectal cancer Neg Hx   . Stomach cancer Neg Hx   . Esophageal cancer Neg Hx     Social History   Tobacco Use  . Smoking status: Former Smoker    Types: Cigarettes    Quit date: 04/17/2006    Years since quitting: 13.5  . Smokeless tobacco: Never Used  Vaping Use  . Vaping Use: Never used  Substance Use Topics  . Alcohol use: No    Alcohol/week: 0.0 standard drinks  . Drug use: No    Current Outpatient Medications  Medication Sig Dispense Refill  . lamoTRIgine (LAMICTAL) 150 MG tablet Take 1 tablet (150 mg total) by mouth 2 (two) times daily. 180 tablet 3  . pantoprazole (PROTONIX) 40 MG tablet TAKE 1 TABLET(40 MG) BY MOUTH TWICE DAILY BEFORE A MEAL 60 tablet 3  .  promethazine (PHENERGAN) 25 MG tablet Take 1 tablet (25 mg total) by mouth every 8 (eight) hours as needed for nausea or vomiting. 30 tablet 1  . topiramate (TOPAMAX) 100 MG tablet TAKE 1/2 TABLET(50 MG) BY MOUTH TWICE DAILY 30 tablet 11   No current facility-administered medications for this visit.    No Known Allergies  Review of Systems:  neg     Physical Exam:    BP 120/82   Pulse 86   Ht 5\' 2"  (1.575 m)   Wt 219 lb 4 oz (99.5 kg)   BMI 40.10 kg/m  Wt Readings from Last 3 Encounters:  10/29/19 219 lb 4 oz (99.5 kg)  07/09/19 217 lb (98.4 kg)  01/08/19 242 lb (109.8 kg)   Constitutional:  Well-developed, in no acute distress. Psychiatric: Normal mood and affect. Behavior is normal. HEENT: Pupils normal.  Conjunctivae are normal. No scleral icterus. Abdominal: Soft, nondistended. Nontender. Bowel sounds active throughout. There are no  masses palpable. No hepatomegaly. Rectal:  defered Neurological: Alert and oriented to person place and time. Skin: Skin is warm and dry. No rashes noted.  Data Reviewed: I have personally reviewed following labs and imaging studies  CBC: CBC Latest Ref Rng & Units 07/09/2019 01/08/2019 06/27/2018  WBC 3.4 - 10.8 x10E3/uL 6.7 7.2 7.4  Hemoglobin 11.1 - 15.9 g/dL 06/29/2018 26.7 12.4  Hematocrit 34.0 - 46.6 % 43.8 41.8 43.1  Platelets 150 - 450 x10E3/uL 320 304 365    CMP: CMP Latest Ref Rng & Units 07/09/2019 01/08/2019 06/27/2018  Glucose 65 - 99 mg/dL 92 92 95  BUN 6 - 24 mg/dL 23 11 21   Creatinine 0.57 - 1.00 mg/dL 06/29/2018 9.98  Sodium 134 - 144 mmol/L 140 138 142  Potassium 3.5 - 5.2 mmol/L 3.9 4.1 4.2  Chloride 96 - 106 mmol/L 105 106 107(H)  CO2 20 - 29 mmol/L 21 19(L) 18(L)  Calcium 8.7 - 10.2 mg/dL 9.3 9.3 9.8  Total Protein 6.0 - 8.5 g/dL 6.7 6.7 6.8  Total Bilirubin 0.0 - 1.2 mg/dL 0.3 0.4 3.38  Alkaline Phos 39 - 117 IU/L 101 72 71  AST 0 - 40 IU/L 14 19 15   ALT 0 - 32 IU/L 12 25 17       2.50, MD 10/29/2019, 8:55 AM  Cc: , MD

## 2019-10-29 NOTE — Patient Instructions (Signed)
If you are age 42 or older, your body mass index should be between 23-30. Your Body mass index is 40.1 kg/m. If this is out of the aforementioned range listed, please consider follow up with your Primary Care Provider.  If you are age 61 or younger, your body mass index should be between 19-25. Your Body mass index is 40.1 kg/m. If this is out of the aformentioned range listed, please consider follow up with your Primary Care Provider.   We have sent the following medications to your pharmacy for you to pick up at your convenience: Protonix 40 mg   You will be due for a recall colonoscopy at age 70. We will send you a reminder in the mail when it gets closer to that time.  Follow up in 1 year.  Thank you,  Dr. Lynann Bologna

## 2020-01-09 ENCOUNTER — Ambulatory Visit: Payer: Medicare HMO | Admitting: Neurology

## 2020-01-09 ENCOUNTER — Encounter: Payer: Self-pay | Admitting: Neurology

## 2020-01-09 ENCOUNTER — Other Ambulatory Visit: Payer: Self-pay | Admitting: Neurology

## 2020-01-09 VITALS — BP 110/82 | HR 85 | Ht 62.0 in | Wt 220.0 lb

## 2020-01-09 DIAGNOSIS — F3289 Other specified depressive episodes: Secondary | ICD-10-CM

## 2020-01-09 DIAGNOSIS — Z9689 Presence of other specified functional implants: Secondary | ICD-10-CM | POA: Diagnosis not present

## 2020-01-09 DIAGNOSIS — G43821 Menstrual migraine, not intractable, with status migrainosus: Secondary | ICD-10-CM

## 2020-01-09 DIAGNOSIS — F32A Depression, unspecified: Secondary | ICD-10-CM | POA: Insufficient documentation

## 2020-01-09 DIAGNOSIS — G40B19 Juvenile myoclonic epilepsy, intractable, without status epilepticus: Secondary | ICD-10-CM | POA: Diagnosis not present

## 2020-01-09 MED ORDER — PROMETHAZINE HCL 25 MG PO TABS: 25.0000 mg | ORAL_TABLET | Freq: Three times a day (TID) | ORAL | 5 refills | Status: DC | PRN

## 2020-01-09 MED ORDER — PROMETHAZINE HCL 25 MG PO TABS
25.0000 mg | ORAL_TABLET | Freq: Three times a day (TID) | ORAL | 5 refills | Status: DC | PRN
Start: 2020-01-09 — End: 2020-12-15

## 2020-01-09 NOTE — Progress Notes (Signed)
PATIENT: Traci [name] DOB: 25-May-1975  REASON FOR VISIT: follow up q 6 month.  HISTORY FROM: patient and husband.  INTERVAL HISTORY : 01/09/20: Here with husband, rm 10. presents today for follow up. She reports  overall been doing well. 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  She has been  Working out again, helps mood. Lost 40 pounds.  she states average month she usually only has 1 seizure, maybe 2. The month of november she has had 3, but states that she has also had a change in her menstrual cycle so she feels they may be hormonal. VNS interrogation. Last seizure described at 20-30 sec duration , just this week. Changes in menstrual cycle- usually very regularly, it was 21 days late and now it is only 2 days long. No spotting. More headaches. More depression-  Lamictal as raised last visit to 150 mg bid by Butch Penny, NP and continues Topamax 50 mg bid. VNS patient.  Here for interrogation.  The venous stimulator was implanted on 03 Jul 2014 is a expected battery life of a decade.  Her current settings are output current of 1.5 mA signal frequency of 20 Hz pulse width of 250 s signal on time duration is 30 seconds and signal of time is 3 minutes between signals.  The measurement has remained at an output current of 1.75 mA pulse width of 250 signal on time of 60 seconds.  Also stimulation is set to an output current of 1.6 to 5 mA pulse width of 250 s signal on time of 1 minute.  And tachycardia detection is 1 with a heart rate sensitivity level of 3 threshold for auto stimulation is a change in heart rate by 40%.      Traci [name] is a 44 year old female with a history of intractable seizures.  She returns today for follow-up.  She is currently on Lamictal 100 mg in the morning and 150 in the evening.  She remains on Topamax 50 mg twice a day.  She also has a vagal nerve stimulator.  Reports that she swiped her magnet 3 times a day.  She continues to have seizures around her  menstrual cycle.  On average she has approximately 1-2 seizures a month.  Reports that the most she has had is four.  She does not operate a motor vehicle.  She returns today for an evaluation  HISTORY 01/08/19:  Traci [name] is a 44 year old female with a history of seizures.  She returns today for follow-up.  She is currently on Lamictal 100 mg in the morning and 150 mg in the evening.  She is also on Topamax 50 mg twice a day.  She also has a vagal nerve stimulator.  She reports that she always has seizures around her menstrual cycle.  She states that typically 7 days before she will start feeling different.   And then she may have 3 days during her cycle that she has events.  These are typically grand mal seizures.  She states the length of the seizure can vary from 30 secs to 2 min. She reports that she is tolerating her medication well.  She does not operate a motor vehicle.  She is able to complete all ADLs independently.  She returns today for an evaluation   REVIEW OF SYSTEMS: Out of a complete 14 system review of symptoms, the patient complains only of the following symptoms, and all other reviewed systems are negative.  See HPI-  ALLERGIES: No Known Allergies  HOME MEDICATIONS: Outpatient Medications Prior to Visit  Medication Sig Dispense Refill  . lamoTRIgine (LAMICTAL) 150 MG tablet Take 1 tablet (150 mg total) by mouth 2 (two) times daily. 180 tablet 3  . pantoprazole (PROTONIX) 40 MG tablet TAKE 1 TABLET(40 MG) BY MOUTH TWICE DAILY BEFORE A MEAL 60 tablet 11  . promethazine (PHENERGAN) 25 MG tablet Take 1 tablet (25 mg total) by mouth every 8 (eight) hours as needed for nausea or vomiting. 30 tablet 1  . topiramate (TOPAMAX) 100 MG tablet TAKE 1/2 TABLET(50 MG) BY MOUTH TWICE DAILY 30 tablet 11   No facility-administered medications prior to visit.    PAST MEDICAL HISTORY: Past Medical History:  Diagnosis Date  . GERD (gastroesophageal reflux disease)   . Headache   .  History of kidney stones    passed stones-no surgery  . IBS (irritable bowel syndrome)   . Interstitial cystitis   . Seizures (HCC)    last one 02/2018 - on new medication    PAST SURGICAL HISTORY: Past Surgical History:  Procedure Laterality Date  . APPENDECTOMY    . CESAREAN SECTION     x 2  . CHOLECYSTECTOMY    . COLONOSCOPY     normal-2010  . KNEE SURGERY Right   . LAPAROSCOPY     endometriosis  . lipotripsy     x 1 - kidney stone removal with stent  . TUBAL LIGATION    . UPPER GASTROINTESTINAL ENDOSCOPY  12/26/2014   Erosive esophagitis (LA grade B). Small hiatal hernoa. Mild gastritis  . VAGUS NERVE STIMULATOR INSERTION N/A 07/04/2014   Procedure: VAGAL NERVE STIMULATOR IMPLANT;  Surgeon: Lisbeth Renshaw, MD;  Location: [facility];  Service: Neurosurgery;  Laterality: N/A;    FAMILY HISTORY: Family History  Problem Relation Age of Onset  . Cancer Mother   . Diabetes Father   . Cancer Maternal Grandmother   . Cancer Maternal Grandfather   . Diabetes Paternal Grandmother   . Diabetes Paternal Grandfather   . Migraines Neg Hx   . Colon cancer Neg Hx   . Rectal cancer Neg Hx   . Stomach cancer Neg Hx   . Esophageal cancer Neg Hx     SOCIAL HISTORY: Social History   Socioeconomic History  . Marital status: Married    Spouse name: [name]  . Number of children: 2  . Years of education: Some colle  . Highest education level: Not on file  Occupational History  . Not on file  Tobacco Use  . Smoking status: Former Smoker    Types: Cigarettes    Quit date: 04/17/2006    Years since quitting: 13.7  . Smokeless tobacco: Never Used  Vaping Use  . Vaping Use: Never used  Substance and Sexual Activity  . Alcohol use: No    Alcohol/week: 0.0 standard drinks  . Drug use: No  . Sexual activity: Yes    Birth control/protection: Surgical    Comment: Tubal Ligation  Other Topics Concern  . Not on file  Social History Narrative   Lives at home with husband, [name]    Daily caffeine consumption- 1 cup   Social Determinants of Health   Financial Resource Strain:   . Difficulty of Paying Living Expenses: Not on file  Food Insecurity:   . Worried About Programme researcher, broadcasting/film/video in the Last Year: Not on file  . Ran Out of Food in the Last Year: Not on file  Transportation  Needs:   . Lack of Transportation (Medical): Not on file  . Lack of Transportation (Non-Medical): Not on file  Physical Activity:   . Days of Exercise per Week: Not on file  . Minutes of Exercise per Session: Not on file  Stress:   . Feeling of Stress : Not on file  Social Connections:   . Frequency of Communication with Friends and Family: Not on file  . Frequency of Social Gatherings with Friends and Family: Not on file  . Attends Religious Services: Not on file  . Active Member of Clubs or Organizations: Not on file  . Attends BankerClub or Organization Meetings: Not on file  . Marital Status: Not on file  Intimate Partner Violence:   . Fear of Current or Ex-Partner: Not on file  . Emotionally Abused: Not on file  . Physically Abused: Not on file  . Sexually Abused: Not on file      PHYSICAL EXAM  Vitals:   01/09/20 0839  BP: 110/82  Pulse: 85  Weight: 220 lb (99.8 kg)  Height: 5\' 2"  (1.575 m)   Body mass index is 40.24 kg/m.  Generalized: Well developed, in no acute distress   Neurological examination  Mentation: Alert oriented to time, place, history taking. Follows all commands speech and language fluent Cranial nerve II-XII: Pupils were equal round reactive to light. Extraocular movements were full, visual field were full on confrontational test.  Head turning and shoulder shrug  were normal and symmetric. Motor: The motor testing reveals 5 over 5 strength of all 4 extremities. Good symmetric motor tone is noted throughout.  Sensory: Sensory testing is intact to soft touch on all 4 extremities. No evidence of extinction is noted.  Coordination: Cerebellar testing  reveals good finger-nose-finger and heel-to-shin bilaterally.  Gait and station: Gait is normal.  Reflexes: Deep tendon reflexes are symmetric and normal bilaterally.   DIAGNOSTIC DATA (LABS, IMAGING, TESTING) - I reviewed patient records, labs, notes, testing and imaging myself where available.  Lab Results  Component Value Date   WBC 6.7 07/09/2019   HGB 14.2 07/09/2019   HCT 43.8 07/09/2019   MCV 95 07/09/2019   PLT 320 07/09/2019      Component Value Date/Time   NA 140 07/09/2019 0901   K 3.9 07/09/2019 0901   CL 105 07/09/2019 0901   CO2 21 07/09/2019 0901   GLUCOSE 92 07/09/2019 0901   GLUCOSE 97 03/14/2018 2138   BUN 23 07/09/2019 0901   CREATININE 0.87 07/09/2019 0901   CALCIUM 9.3 07/09/2019 0901   PROT 6.7 07/09/2019 0901   ALBUMIN 4.4 07/09/2019 0901   AST 14 07/09/2019 0901   ALT 12 07/09/2019 0901   ALKPHOS 101 07/09/2019 0901   BILITOT 0.3 07/09/2019 0901   GFRNONAA 82 07/09/2019 0901   GFRAA 95 07/09/2019 0901    Lab Results  Component Value Date   TSH 1.260 05/08/2014    ASSESSMENT AND PLAN 44 y.o. year old female  has a past medical history of GERD (gastroesophageal reflux disease), Headache,  History of kidney stones, IBS (irritable bowel syndrome), Interstitial cystitis, Depression and Seizures (HCC). here with:  1.  Seizures on VNS - changes in 2 settings today.   -Continue Topamax 50 mg twice a day/  Lamictal to 150 milligrams twice a day -Blood work today-CBC, CMP, Lamictal level -Continue to swipe magnet for VNS: Prior to and during her menstrual cycle- I changed tachycardia detection to 50% change in heart rate- allowing for exercise  induced tachycardia.  I increased pulse width on normal settings to 500 micro-seconds, from 250 micro sec.  -If your seizure frequency increases or you develop new symptoms please let us know, we will follow up in januray with a phone or video call.  -Follow-up in person in 6 months.  I spent 20 minutes of  face-to-face and non-face-to-face time with patient.  This included previsit chart review, lab review, study review, order entry, electronic health record documentation, patient education.  Melvyn Novas, MD   01/09/2020, 8:47 AM [facility] [address], [address] [place], [place] [address] [phone]

## 2020-01-11 ENCOUNTER — Other Ambulatory Visit: Payer: Self-pay | Admitting: Gastroenterology

## 2020-02-19 DIAGNOSIS — Z0271 Encounter for disability determination: Secondary | ICD-10-CM

## 2020-04-07 ENCOUNTER — Encounter: Payer: Self-pay | Admitting: Neurology

## 2020-04-13 ENCOUNTER — Telehealth: Payer: Self-pay | Admitting: Adult Health

## 2020-04-13 NOTE — Telephone Encounter (Signed)
02/15 Mychart visit moved to 1:30 pm per Butch Penny, NP.

## 2020-04-14 ENCOUNTER — Telehealth (INDEPENDENT_AMBULATORY_CARE_PROVIDER_SITE_OTHER): Payer: Medicare (Managed Care) | Admitting: Adult Health

## 2020-04-14 ENCOUNTER — Telehealth: Payer: Medicare HMO | Admitting: Family Medicine

## 2020-04-14 DIAGNOSIS — G40B19 Juvenile myoclonic epilepsy, intractable, without status epilepticus: Secondary | ICD-10-CM | POA: Diagnosis not present

## 2020-04-14 NOTE — Progress Notes (Signed)
PATIENT: Traci [name] DOB: Dec 21, 1975  REASON FOR VISIT: follow up HISTORY FROM: patient  Virtual Visit via Video Note  I connected with Jolayne Panther on 04/14/20 at  1:30 PM EST by a video enabled telemedicine application located remotely at [facility] and verified that I am speaking with the correct person using two identifiers who was located at their own home.   I discussed the limitations of evaluation and management by telemedicine and the availability of in person appointments. The patient expressed understanding and agreed to proceed.   PATIENT: Traci [name] DOB: April 26, 1975  REASON FOR VISIT: follow up HISTORY FROM: patient  HISTORY OF PRESENT ILLNESS: Today 04/14/20:  Traci [name] is a 45 year old female with a history of intractable seizures.  She returns today for follow-up.  She remains on Lamictal 150 mg twice a day and Topamax 50 mg twice a day.  At the last visit with Dr. Vickey Huger her VNS was adjusted.  She states that since that adjustment her seizure frequency has remained the same.  She has approximately 1-2 seizures a month.  Her seizures always occur around her menstrual cycle.  She continues to swipe her magnet during her menstrual cycle.  She joins me today through virtual visit  HISTORY 07/09/19:  Traci [name] is a 45 year old female with a history of intractable seizures.  She returns today for follow-up.  She is currently on Lamictal 100 mg in the morning and 150 in the evening.  She remains on Topamax 50 mg twice a day.  She also has a vagal nerve stimulator.  Reports that she swiped her magnet 3 times a day.  She continues to have seizures around her menstrual cycle.  On average she has approximately 1-2 seizures a month.  Reports that the most she has had is four.  She does not operate a motor vehicle.  She returns today for an evaluation  REVIEW OF SYSTEMS: Out of a complete 14 system review of symptoms, the patient complains  only of the following symptoms, and all other reviewed systems are negative.  See HPI  ALLERGIES: No Known Allergies  HOME MEDICATIONS: Outpatient Medications Prior to Visit  Medication Sig Dispense Refill  . lamoTRIgine (LAMICTAL) 150 MG tablet Take 1 tablet (150 mg total) by mouth 2 (two) times daily. 180 tablet 3  . pantoprazole (PROTONIX) 40 MG tablet TAKE 1 TABLET(40 MG) BY MOUTH TWICE DAILY BEFORE A MEAL 60 tablet 11  . promethazine (PHENERGAN) 25 MG tablet Take 1 tablet (25 mg total) by mouth every 8 (eight) hours as needed for nausea or vomiting. 30 tablet 5  . topiramate (TOPAMAX) 100 MG tablet TAKE 1/2 TABLET(50 MG) BY MOUTH TWICE DAILY 30 tablet 11   No facility-administered medications prior to visit.    PAST MEDICAL HISTORY: Past Medical History:  Diagnosis Date  . GERD (gastroesophageal reflux disease)   . Headache   . History of kidney stones    passed stones-no surgery  . IBS (irritable bowel syndrome)   . Interstitial cystitis   . Seizures (HCC)    last one 02/2018 - on new medication    PAST SURGICAL HISTORY: Past Surgical History:  Procedure Laterality Date  . APPENDECTOMY    . CESAREAN SECTION     x 2  . CHOLECYSTECTOMY    . COLONOSCOPY     normal-2010  . KNEE SURGERY Right   . LAPAROSCOPY     endometriosis  . lipotripsy  x 1 - kidney stone removal with stent  . TUBAL LIGATION    . UPPER GASTROINTESTINAL ENDOSCOPY  12/26/2014   Erosive esophagitis (LA grade B). Small hiatal hernoa. Mild gastritis  . VAGUS NERVE STIMULATOR INSERTION N/A 07/04/2014   Procedure: VAGAL NERVE STIMULATOR IMPLANT;  Surgeon: Lisbeth Renshaw, MD;  Location: [facility];  Service: Neurosurgery;  Laterality: N/A;    FAMILY HISTORY: Family History  Problem Relation Age of Onset  . Cancer Mother   . Diabetes Father   . Cancer Maternal Grandmother   . Cancer Maternal Grandfather   . Diabetes Paternal Grandmother   . Diabetes Paternal Grandfather   . Migraines Neg  Hx   . Colon cancer Neg Hx   . Rectal cancer Neg Hx   . Stomach cancer Neg Hx   . Esophageal cancer Neg Hx     SOCIAL HISTORY: Social History   Socioeconomic History  . Marital status: Married    Spouse name: Traci [name]  . Number of children: 2  . Years of education: Some colle  . Highest education level: Not on file  Occupational History  . Not on file  Tobacco Use  . Smoking status: Former Smoker    Types: Cigarettes    Quit date: 04/17/2006    Years since quitting: 14.0  . Smokeless tobacco: Never Used  Vaping Use  . Vaping Use: Never used  Substance and Sexual Activity  . Alcohol use: No    Alcohol/week: 0.0 standard drinks  . Drug use: No  . Sexual activity: Yes    Birth control/protection: Surgical    Comment: Tubal Ligation  Other Topics Concern  . Not on file  Social History Narrative   Lives at home with husband, Traci [name]   Daily caffeine consumption- 1 cup   Social Determinants of Health   Financial Resource Strain: Not on file  Food Insecurity: Not on file  Transportation Needs: Not on file  Physical Activity: Not on file  Stress: Not on file  Social Connections: Not on file  Intimate Partner Violence: Not on file      PHYSICAL EXAM Generalized: Well developed, in no acute distress   Neurological examination  Mentation: Alert oriented to time, place, history taking. Follows all commands speech and language fluent Cranial nerve II-XII:Extraocular movements were full. Facial symmetry noted. uvula tongue midline. Head turning and shoulder shrug  were normal and symmetric. Motor: Good strength throughout subjectively per patient Sensory: Sensory testing is intact to soft touch on all 4 extremities subjectively per patient Coordination: Cerebellar testing reveals good finger-nose-finger  Gait and station: Patient is able to stand from a seated position. gait is normal.  Reflexes: UTA  DIAGNOSTIC DATA (LABS, IMAGING, TESTING) - I reviewed patient records,  labs, notes, testing and imaging myself where available.  Lab Results  Component Value Date   WBC 6.7 07/09/2019   HGB 14.2 07/09/2019   HCT 43.8 07/09/2019   MCV 95 07/09/2019   PLT 320 07/09/2019      Component Value Date/Time   NA 140 07/09/2019 0901   K 3.9 07/09/2019 0901   CL 105 07/09/2019 0901   CO2 21 07/09/2019 0901   GLUCOSE 92 07/09/2019 0901   GLUCOSE 97 03/14/2018 2138   BUN 23 07/09/2019 0901   CREATININE 0.87 07/09/2019 0901   CALCIUM 9.3 07/09/2019 0901   PROT 6.7 07/09/2019 0901   ALBUMIN 4.4 07/09/2019 0901   AST 14 07/09/2019 0901   ALT 12 07/09/2019 0901   ALKPHOS  101 07/09/2019 0901   BILITOT 0.3 07/09/2019 0901   GFRNONAA 82 07/09/2019 0901   GFRAA 95 07/09/2019 0901   No results found for: CHOL, HDL, LDLCALC, LDLDIRECT, TRIG, CHOLHDL No results found for: MVVK1Q No results found for: VITAMINB12 Lab Results  Component Value Date   TSH 1.260 05/08/2014      ASSESSMENT AND PLAN 45 y.o. year old female  has a past medical history of GERD (gastroesophageal reflux disease), Headache, History of kidney stones, IBS (irritable bowel syndrome), Interstitial cystitis, and Seizures (HCC). here with:  1.  Seizures--intractable  --Continue Lamictal 150 mg twice a day --Continue Topamax 50 mg twice a day --Discussed with Dr. Vickey Huger.  At this time will not make any adjustments to her seizure medication.   I spent 25 minutes of face-to-face and non-face-to-face time with patient.  This included previsit chart review, lab review, study review, order entry, electronic health record documentation, patient education.    Butch Penny, MSN, NP-C 04/14/2020, 1:25 PM [facility] [address] [place], [place] [address] [phone]

## 2020-04-20 ENCOUNTER — Telehealth: Payer: Self-pay | Admitting: *Deleted

## 2020-04-20 NOTE — Telephone Encounter (Signed)
I called pt and relayed that per MM/NP and Dr. Vickey Huger that will not make any further adjustments to her medication.  Pt stated ok, verbalized understanding.

## 2020-04-20 NOTE — Telephone Encounter (Signed)
-----   Message from Butch Penny, NP sent at 04/16/2020  5:05 PM EST ----- Please let patient know that I talked to Dr. Vickey Huger.  At this time we will not make any further adjustments to her medication

## 2020-07-08 ENCOUNTER — Ambulatory Visit: Payer: Medicare HMO | Admitting: Neurology

## 2020-07-14 ENCOUNTER — Encounter: Payer: Self-pay | Admitting: Neurology

## 2020-07-15 ENCOUNTER — Encounter: Payer: Self-pay | Admitting: Neurology

## 2020-07-15 ENCOUNTER — Other Ambulatory Visit: Payer: Self-pay

## 2020-07-15 ENCOUNTER — Ambulatory Visit: Payer: Medicare (Managed Care) | Admitting: Neurology

## 2020-07-15 VITALS — BP 116/72 | HR 70 | Ht 62.0 in | Wt 226.0 lb

## 2020-07-15 DIAGNOSIS — G43821 Menstrual migraine, not intractable, with status migrainosus: Secondary | ICD-10-CM | POA: Diagnosis not present

## 2020-07-15 DIAGNOSIS — G40B19 Juvenile myoclonic epilepsy, intractable, without status epilepticus: Secondary | ICD-10-CM

## 2020-07-15 DIAGNOSIS — Z9689 Presence of other specified functional implants: Secondary | ICD-10-CM | POA: Diagnosis not present

## 2020-07-15 MED ORDER — TOPIRAMATE 100 MG PO TABS
50.0000 mg | ORAL_TABLET | Freq: Two times a day (BID) | ORAL | 3 refills | Status: DC
Start: 2020-07-15 — End: 2020-12-15

## 2020-07-15 MED ORDER — LAMOTRIGINE 150 MG PO TABS
150.0000 mg | ORAL_TABLET | Freq: Two times a day (BID) | ORAL | 3 refills | Status: DC
Start: 2020-07-15 — End: 2020-12-15

## 2020-07-15 NOTE — Patient Instructions (Signed)
Lamotrigine tablets What is this medicine? LAMOTRIGINE (la MOE tri jeen) is used to control seizures in adults and children with epilepsy and Lennox-Gastaut syndrome. It is also used in adults to treat bipolar disorder. This medicine may be used for other purposes; ask your health care provider or pharmacist if you have questions. COMMON BRAND NAME(S): Lamictal, Subvenite What should I tell my health care provider before I take this medicine? They need to know if you have any of these conditions:  heart disease  history of irregular heartbeat  immune system problems  kidney disease  liver disease  low levels of folic acid in the blood  lupus  mental illness  suicidal thoughts, plans, or attempt; a previous suicide attempt by you or a family member  an unusual or allergic reaction to lamotrigine or other seizure medications, other medicines, foods, dyes, or preservatives  pregnant or trying to get pregnant  breast-feeding How should I use this medicine? Take this medicine by mouth with a glass of water. Follow the directions on the prescription label. Do not chew these tablets. If this medicine upsets your stomach, take it with food or milk. Take your doses at regular intervals. Do not take your medicine more often than directed. A special MedGuide will be given to you by the pharmacist with each new prescription and refill. Be sure to read this information carefully each time. Talk to your pediatrician regarding the use of this medicine in children. While this drug may be prescribed for children as young as 2 years for selected conditions, precautions do apply. Overdosage: If you think you have taken too much of this medicine contact a poison control center or emergency room at once. NOTE: This medicine is only for you. Do not share this medicine with others. What if I miss a dose? If you miss a dose, take it as soon as you can. If it is almost time for your next dose, take only  that dose. Do not take double or extra doses. What may interact with this medicine?  atazanavir  birth control pills  certain medicines for irregular heartbeat  certain medicines for seizures like carbamazepine, phenobarbital, phenytoin, primidone, valproic acid  lopinavir  rifampin  ritonavir This list may not describe all possible interactions. Give your health care provider a list of all the medicines, herbs, non-prescription drugs, or dietary supplements you use. Also tell them if you smoke, drink alcohol, or use illegal drugs. Some items may interact with your medicine. What should I watch for while using this medicine? Visit your doctor or health care providerfor regular checks on your progress. If you take this medicine for seizures, wear a Medic Alert bracelet or necklace. Carry an identification card with information about your condition, medicines, and doctor or health care provider. It is important to take this medicine exactly as directed. When first starting treatment, your dose will need to be adjusted slowly. It may take weeks or months before your dose is stable. You should contact your doctor or health care providerif your seizures get worse or if you have any new types of seizures. Do not stop taking this medicine unless instructed by your doctor or health care provider. Stopping your medicine suddenly can increase your seizures or their severity. This medicine may cause serious skin reactions. They can happen weeks to months after starting the medicine. Contact your health care provider right away if you notice fevers or flu-like symptoms with a rash. The rash may be red or purple   and then turn into blisters or peeling of the skin. Or, you might notice a red rash with swelling of the face, lips or lymph nodes in your neck or under your arms. You may get drowsy, dizzy, or have blurred vision. Do not drive, use machinery, or do anything that needs mental alertness until you know  how this medicine affects you. To reduce dizzy or fainting spells, do not sit or stand up quickly, especially if you are an older patient. Alcohol can increase drowsiness and dizziness. Avoid alcoholic drinks. If you are taking this medicine for bipolar disorder, it is important to report any changes in your mood to your doctor or health care provider. If your condition gets worse, you get mentally depressed, feel very hyperactive or manic, have difficulty sleeping, or have thoughts of hurting yourself or committing suicide, you need to get help from your health care providerright away. If you are a caregiver for someone taking this medicine for bipolar disorder, you should also report these behavioral changes right away. The use of this medicine may increase the chance of suicidal thoughts or actions. Pay special attention to how you are responding while on this medicine. Your mouth may get dry. Chewing sugarless gum or sucking hard candy, and drinking plenty of water may help. Contact your doctor if the problem does not go away or is severe. Women who become pregnant while using this medicine may enroll in the [place] Antiepileptic Drug Pregnancy Registry by calling 1-888-233-2334. This registry collects information about the safety of antiepileptic drug use during pregnancy. This medicine may cause a decrease in folic acid. You should make sure that you get enough folic acid while you are taking this medicine. Discuss the foods you eat and the vitamins you take with your health care provider. What side effects may I notice from receiving this medicine? Side effects that you should report to your doctor or health care professional as soon as possible:  allergic reactions (skin rash, itching or hives; swelling of the face, lips, or tongue)  aseptic meningitis (stiff neck; sensitivity to light; headache; drowsiness; fever; nausea, vomiting; rash)  changes in vision  heartbeat rhythm changes  (trouble breathing; chest pain; dizziness; fast, irregular heartbeat; feeling faint or lightheaded, falls)  infection (fever, chills, cough, sore throat, pain or trouble passing urine)  liver injury (dark yellow or brown urine; general ill feeling or flu-like symptoms; loss of appetite, right upper belly pain; unusually weak or tired, yellowing of the eyes or skin)  low red blood cell counts (trouble breathing; feeling faint; lightheaded, falls; unusually weak or tired)  rash, fever, and swollen lymph nodes  redness, blistering, peeling, or loosening of the skin, including inside the mouth  seizures  suicidal thoughts, mood changes  unusual bruising or bleeding Side effects that usually do not require medical attention (report to your doctor or health care professional if they continue or are bothersome):  diarrhea  dizziness  nausea  stomach pain  tremors  vomiting This list may not describe all possible side effects. Call your doctor for medical advice about side effects. You may report side effects to FDA at 1-800-FDA-1088. Where should I keep my medicine? Keep out of the reach of children and pets. Store at 25 degrees C (77 degrees F). Protect from light. Get rid of any unused medicine after the expiration date. To get rid of medicines that are no longer needed or have expired:  Take the medicine to a medicine take-back program. Check   with your pharmacy or law enforcement to find a location.  If you cannot return the medicine, check the label or package insert to see if the medicine should be thrown out in the garbage or flushed down the toilet. If you are not sure, ask your health care provider. If it is safe to put it in the trash, empty the medicine out of the container. Mix the medicine with cat litter, dirt, coffee grounds, or other unwanted substance. Seal the mixture in a bag or container. Put it in the trash. NOTE: This sheet is a summary. It may not cover all  possible information. If you have questions about this medicine, talk to your doctor, pharmacist, or health care provider.  2021 Elsevier/Gold Standard (2019-05-30 10:26:40)  

## 2020-07-15 NOTE — Progress Notes (Signed)
PATIENT: Traci [name] DOB: [birth year]  REASON FOR VISIT: follow up q 6 month.  HISTORY FROM: patient and husband.  INTERVAL HISTORY :   07-15-2020: CD, patient reports an incident , [place] late [place] 2022[place] Did write down the scores, and suddenly was on the floor, no aura, lasted 1-2 minutes and not a long postictum- 5-8 minutes to re-orientate herself. Not related to menstrual cycle.  The first seizure in a long time.  I could hear his current settings, the output is set to 1.5 mA outer stimulation to 1.625 mA and the magnet strength to 1.75 mA the frequency is 20 Hz.  Pulse width is 500 s for the auto stimulation 20 to 50 s for the magnet 250 s.  On time 30 seconds for the normal baseline auto stimulation 60 seconds Magnet 60 seconds.  Of time is 3 minutes duty cycle is 16%.  Battery at this time is over 25%.  Lead impedance is 2516 home.  Output current 1.5 mA escorted above.  There were no magnet stimulations today.  Auto stim events 20.47/day. We discussed regular magnet use. She has been reset to 40 % heart rate increase threshold. all functions were screened and the patient VNS has only 11-255 of battery power. I would like to set her up for replacement in the next 6-8 month.      04/14/2020 with Np [place], adjusted VNS settings.   01/09/20: CD Here with husband, rm 10. presents today for follow up. She reports  overall been doing well. 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   She has been  Working out again, helps mood. Lost 40 pounds.  she states average month she usually only has 1 seizure, maybe 2. The month of november she has had 3, but states that she has also had a change in her menstrual cycle so she feels they may be hormonal. VNS interrogation. Last seizure described at 20-30 sec duration , just this week. Changes in menstrual cycle- usually very regularly, it was 21 days late and now it is only 2 days long. No spotting. More  headaches. More depression-  Lamictal as raised last visit to 150 mg bid by Butch Penny, NP and continues Topamax 50 mg bid. VNS patient.  Here for interrogation.  The venous stimulator was implanted on 03 Jul 2014 is a expected battery life of a decade.  Her current settings are output current of 1.5 mA signal frequency of 20 Hz pulse width of 250 s signal on time duration is 30 seconds and signal of time is 3 minutes between signals.   The measurement has remained at an output current of 1.75 mA pulse width of 250 signal on time of 60 seconds.  Also stimulation is set to an output current of 1.6 to 5 mA pulse width of 250 s signal on time of 1 minute.  And tachycardia detection is 1 with a heart rate sensitivity level of 3 threshold for auto stimulation is a change in heart rate by 40%.      Traci [name] is a 45 year old female with a history of intractable seizures.  She returns today for follow-up.  She is currently on Lamictal 100 mg in the morning and 150 in the evening.  She remains on Topamax 50 mg twice a day.  She also has a vagal nerve stimulator.  Reports that she swiped her magnet 3 times a day.  She continues to have  seizures around her menstrual cycle.  On average she has approximately 1-2 seizures a month.  Reports that the most she has had is four.  She does not operate a motor vehicle.  She returns today for an evaluation  HISTORY 01/08/19:  Traci [name] is a 45 year old female with a history of seizures.  She returns today for follow-up.  She is currently on Lamictal 100 mg in the morning and 150 mg in the evening.  She is also on Topamax 50 mg twice a day.  She also has a vagal nerve stimulator.  She reports that she always has seizures around her menstrual cycle.  She states that typically 7 days before she will start feeling different.   And then she may have 3 days during her cycle that she has events.  These are typically grand mal seizures.  She states the length of the seizure  can vary from 30 secs to 2 min. She reports that she is tolerating her medication well.  She does not operate a motor vehicle.  She is able to complete all ADLs independently.  She returns today for an evaluation   REVIEW OF SYSTEMS: Out of a complete 14 system review of symptoms, the patient complains only of the following symptoms, and all other reviewed systems are negative.  See HPI-   ALLERGIES: No Known Allergies  HOME MEDICATIONS: Outpatient Medications Prior to Visit  Medication Sig Dispense Refill  . lamoTRIgine (LAMICTAL) 150 MG tablet Take 1 tablet (150 mg total) by mouth 2 (two) times daily. 180 tablet 3  . pantoprazole (PROTONIX) 40 MG tablet TAKE 1 TABLET(40 MG) BY MOUTH TWICE DAILY BEFORE A MEAL 60 tablet 11  . promethazine (PHENERGAN) 25 MG tablet Take 1 tablet (25 mg total) by mouth every 8 (eight) hours as needed for nausea or vomiting. 30 tablet 5  . topiramate (TOPAMAX) 100 MG tablet Take 50 mg by mouth in the morning and at bedtime.     No facility-administered medications prior to visit.    PAST MEDICAL HISTORY: Past Medical History:  Diagnosis Date  . GERD (gastroesophageal reflux disease)   . Headache   . History of kidney stones    passed stones-no surgery  . IBS (irritable bowel syndrome)   . Interstitial cystitis   . Seizures (HCC)    last one 02/2018 - on new medication    PAST SURGICAL HISTORY: Past Surgical History:  Procedure Laterality Date  . APPENDECTOMY    . CESAREAN SECTION     x 2  . CHOLECYSTECTOMY    . COLONOSCOPY     normal-2010  . KNEE SURGERY Right   . LAPAROSCOPY     endometriosis  . lipotripsy     x 1 - kidney stone removal with stent  . TUBAL LIGATION    . UPPER GASTROINTESTINAL ENDOSCOPY  12/26/2014   Erosive esophagitis (LA grade B). Small hiatal hernoa. Mild gastritis  . VAGUS NERVE STIMULATOR INSERTION N/A 07/04/2014   Procedure: VAGAL NERVE STIMULATOR IMPLANT;  Surgeon: Lisbeth Renshaw, MD;  Location: [facility];   Service: Neurosurgery;  Laterality: N/A;    FAMILY HISTORY: Family History  Problem Relation Age of Onset  . Cancer Mother   . Diabetes Father   . Cancer Maternal Grandmother   . Cancer Maternal Grandfather   . Diabetes Paternal Grandmother   . Diabetes Paternal Grandfather   . Migraines Neg Hx   . Colon cancer Neg Hx   . Rectal cancer Neg Hx   .  Stomach cancer Neg Hx   . Esophageal cancer Neg Hx     SOCIAL HISTORY: Social History   Socioeconomic History  . Marital status: Married    Spouse name: Alycia RossettiRyan  . Number of children: 2  . Years of education: Some colle  . Highest education level: Not on file  Occupational History  . Not on file  Tobacco Use  . Smoking status: Former Smoker    Types: Cigarettes    Quit date: 04/17/2006    Years since quitting: 14.2  . Smokeless tobacco: Never Used  Vaping Use  . Vaping Use: Never used  Substance and Sexual Activity  . Alcohol use: No    Alcohol/week: 0.0 standard drinks  . Drug use: No  . Sexual activity: Yes    Birth control/protection: Surgical    Comment: Tubal Ligation  Other Topics Concern  . Not on file  Social History Narrative   Lives at home with husband, Alycia RossettiRyan   Daily caffeine consumption- 1 cup   Social Determinants of Health   Financial Resource Strain: Not on file  Food Insecurity: Not on file  Transportation Needs: Not on file  Physical Activity: Not on file  Stress: Not on file  Social Connections: Not on file  Intimate Partner Violence: Not on file      PHYSICAL EXAM  Vitals:   07/15/20 0830  BP: 116/72  Pulse: 70  Weight: 226 lb (102.5 kg)  Height: 5\' 2"  (1.575 m)   Body mass index is 41.34 kg/m.  Generalized: Well developed, in no acute distress   Neurological examination  Mentation: Alert oriented to time, place, history taking. Follows all commands speech and language fluent Cranial nerve : no loss of taste or smell.  Pupils were equal round reactive to light. Extraocular  movements were full,  visual field were full on confrontational test.  Head turning and shoulder shrug  were normal and symmetric. Motor: full strength of all 4 extremities and grip with symmetric motor tone is noted throughout.  Sensory: intact to vibration and soft touch No evidence of extinction is noted.  Coordination: reveals good-nose-finger bilaterally.  Gait and station: Gait is intact.  Reflexes: Deep tendon reflexes are symmetric and normal bilaterally.   DIAGNOSTIC DATA (LABS, IMAGING, TESTING) - I reviewed patient records, labs, notes, testing and imaging myself where available.  Lab Results  Component Value Date   WBC 6.7 07/09/2019   HGB 14.2 07/09/2019   HCT 43.8 07/09/2019   MCV 95 07/09/2019   PLT 320 07/09/2019      Component Value Date/Time   NA 140 07/09/2019 0901   K 3.9 07/09/2019 0901   CL 105 07/09/2019 0901   CO2 21 07/09/2019 0901   GLUCOSE 92 07/09/2019 0901   GLUCOSE 97 03/14/2018 2138   BUN 23 07/09/2019 0901   CREATININE 0.87 07/09/2019 0901   CALCIUM 9.3 07/09/2019 0901   PROT 6.7 07/09/2019 0901   ALBUMIN 4.4 07/09/2019 0901   AST 14 07/09/2019 0901   ALT 12 07/09/2019 0901   ALKPHOS 101 07/09/2019 0901   BILITOT 0.3 07/09/2019 0901   GFRNONAA 82 07/09/2019 0901   GFRAA 95 07/09/2019 0901    Lab Results  Component Value Date   TSH 1.260 05/08/2014    ASSESSMENT AND PLAN 45 y.o. year old female  has a past medical history of GERD (gastroesophageal reflux disease), Headache,  History of kidney stones, IBS (irritable bowel syndrome), Interstitial cystitis, Depression and Seizures (HCC). here with:  1.  Seizures on VNS - changes in 2 settings today.   -Continue Topamax 50 mg twice a day/  Lamictal to 150 milligrams twice a day -Blood work today-CBC, CMP, Lamictal level -Continue to swipe magnet for VNS: she hadn't used the magnet since the last visit(!)  Prior to and during her menstrual cycle- I changed tachycardia detection back to  40% change in heart rate- allowing for exercise induced tachycardia.  I increased pulse width on normal settings to 500 micro-seconds, from 250 micro sec.  -If your seizure frequency increases or you develop new symptoms please let us know, we will follow up in Novemember - we need to check on the low battery again- .  -Follow-up in person in 6 months.  I spent 20 minutes of face-to-face and non-face-to-face time with patient.  This included previsit chart review, lab review, study review, order entry, electronic health record documentation, patient education.  Melvyn Novas, MD   07/15/2020, 8:41 AM [facility] [address] [place], [place] [address] [phone]

## 2020-07-16 ENCOUNTER — Encounter: Payer: Self-pay | Admitting: Neurology

## 2020-07-16 LAB — COMPREHENSIVE METABOLIC PANEL
ALT: 16 IU/L (ref 0–32)
AST: 15 IU/L (ref 0–40)
Albumin/Globulin Ratio: 1.8 (ref 1.2–2.2)
Albumin: 4.5 g/dL (ref 3.8–4.8)
Alkaline Phosphatase: 91 IU/L (ref 44–121)
BUN/Creatinine Ratio: 19 (ref 9–23)
BUN: 16 mg/dL (ref 6–24)
Bilirubin Total: 0.3 mg/dL (ref 0.0–1.2)
CO2: 20 mmol/L (ref 20–29)
Calcium: 9.4 mg/dL (ref 8.7–10.2)
Chloride: 108 mmol/L — ABNORMAL HIGH (ref 96–106)
Creatinine, Ser: 0.83 mg/dL (ref 0.57–1.00)
Globulin, Total: 2.5 g/dL (ref 1.5–4.5)
Glucose: 92 mg/dL (ref 65–99)
Potassium: 4.3 mmol/L (ref 3.5–5.2)
Sodium: 144 mmol/L (ref 134–144)
Total Protein: 7 g/dL (ref 6.0–8.5)
eGFR: 90 mL/min/{1.73_m2} (ref >59)

## 2020-07-16 LAB — LAMOTRIGINE LEVEL: Lamotrigine Lvl: 3.1 ug/mL (ref 2.0–20.0)

## 2020-07-16 NOTE — Progress Notes (Signed)
Normal CMP- no renal or hepatic abnormality.

## 2020-07-16 NOTE — Progress Notes (Signed)
Low therapeutic level of LAMOTRIGINE.

## 2020-07-20 ENCOUNTER — Telehealth: Payer: Self-pay | Admitting: Neurology

## 2020-07-20 NOTE — Telephone Encounter (Signed)
Called the pt's husband back. In reviewing the data on VNS tablet from her recent visit the only change that was the tachycardia detection was decreased from 50% to 40 %. All of the output currents are same and pulse width's are the same. Informed that although she is not feeling it like she was there was no changes.   The husband quickly raised his voice stating that his "wife has had this device and she knows what it feels like and if she is noting there to be a difference than something is not right". I questioned the husband on whether the patient has had any pain or seizures and he states "no, she hasn't but that even when she swipes she states it doesn't feel as strong as it is suppose to" He states that she is used to feeling it go off every so often and its not doing that. Informed that I had talked with the VNS rep prior to calling him to run the situation by him and she didn't seem concerned and he immediately was demanding that something is not right and that he is not trying to risk it and wait for her to have a seizure to find out. I advised that I understand his concern and that I would discuss with Dr Vickey Huger as well and get her thoughts.   I had called to discuss this with the VNS rep and considering there was no changes to the device, the battery life still states ok, they pt declines pain or seizures she states that we would just monitor her as we have been.  She states that overtime patients can become more and more acclimated to the device and that it shouldn't be felt as frequently nor as strongly. She indicated that she didn't see where there would be cause for concern.   The husband will not hear this from me or the VNS rep, he is asking for the MD to call him and address this change because he thinks the pad could not have programmed like it should have been and strongly feels something is wrong because of what she is stating. Informed I would pass this along to MD.

## 2020-07-20 NOTE — Telephone Encounter (Signed)
Pt's husband, Jenalyn Girdner (on [place]) called, last appt VNS device setting was changed. Device is not going off as many times as it was, not as strong as it was. Would like a call from the nurse.

## 2020-07-20 NOTE — Telephone Encounter (Signed)
Called the husband back and he agreed to coming in tomorrow to have the VNS reassessed. Advised that Dr Vickey Huger doesn't feel that the patient should be having any negative effects given there was no changes.   Pt's husband states that he is fine with coming in, the patient needs this but is concerned since they are in limbo with switching insurances whether he will have to pay another visit bill or since it is following up to assess changes in last weeks apt if its included. Informed that I would discuss with our billing dept on this issue. He was appreciative.

## 2020-07-21 ENCOUNTER — Encounter: Payer: Self-pay | Admitting: Neurology

## 2020-07-21 ENCOUNTER — Telehealth: Payer: Self-pay | Admitting: Neurology

## 2020-07-21 ENCOUNTER — Ambulatory Visit: Payer: Medicare (Managed Care) | Admitting: Neurology

## 2020-07-21 ENCOUNTER — Other Ambulatory Visit: Payer: Self-pay

## 2020-07-21 DIAGNOSIS — G40B19 Juvenile myoclonic epilepsy, intractable, without status epilepticus: Secondary | ICD-10-CM

## 2020-07-21 DIAGNOSIS — Z9689 Presence of other specified functional implants: Secondary | ICD-10-CM | POA: Diagnosis not present

## 2020-07-21 DIAGNOSIS — Z5181 Encounter for therapeutic drug level monitoring: Secondary | ICD-10-CM

## 2020-07-21 NOTE — Progress Notes (Signed)
PATIENT: Traci [name] DOB: [birth year]  REASON FOR VISIT: follow up q 6 month.  HISTORY FROM: patient and husband.  INTERVAL HISTORY :   07-15-2020: CD, patient reports an incident , [place] in late [place] 2022, [place]. Did write down the scores, and suddenly was on the floor, no aura, lasted 1-2 minutes and not a long postictum- 5-8 minutes to re-orientate herself. Not related to menstrual cycle.  The first seizure in a long time.  I could hear his current settings, the output is set to 1.5 mA outer stimulation to 1.625 mA and the magnet strength to 1.75 mA the frequency is 20 Hz.  Pulse width is 500 s for the auto stimulation 20 to 50 s for the magnet 250 s.  On time 30 seconds for the normal baseline auto stimulation 60 seconds Magnet 60 seconds.  Of time is 3 minutes duty cycle is 16%.  Battery at this time is over 25%.  Lead impedance is 2516 home.  Output current 1.5 mA escorted above.  There were no magnet stimulations today.  Auto stim events 20.47/day. We discussed regular magnet use. She has been reset to 40 % heart rate increase threshold. all functions were screened and the patient VNS has only 11-255 of battery power. I would like to set her up for replacement in the next 6-8 month.      04/14/2020 with [facility] [place], adjusted VNS settings.   01/09/20: CD Here with husband, rm 10. presents today for follow up. She reports  overall been doing well. 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   She has been  Working out again, helps mood. Lost 40 pounds.  she states average month she usually only has 1 seizure, maybe 2. The month of november she has had 3, but states that she has also had a change in her menstrual cycle so she feels they may be hormonal. VNS interrogation. Last seizure described at 20-30 sec duration , just this week. Changes in menstrual cycle- usually very regularly, it was 21 days late and now it is only 2 days long. No spotting. More  headaches. More depression-  Lamictal as raised last visit to 150 mg bid by Butch Penny, NP and continues Topamax 50 mg bid. VNS patient.  Here for interrogation.  The venous stimulator was implanted on 03 Jul 2014 is a expected battery life of a decade.  Her current settings are output current of 1.5 mA signal frequency of 20 Hz pulse width of 250 s signal on time duration is 30 seconds and signal of time is 3 minutes between signals.   The measurement has remained at an output current of 1.75 mA pulse width of 250 signal on time of 60 seconds.  Also stimulation is set to an output current of 1.6 to 5 mA pulse width of 250 s signal on time of 1 minute.  And tachycardia detection is 1 with a heart rate sensitivity level of 3 threshold for auto stimulation is a change in heart rate by 40%.      Traci [name] is a 45 year old female with a history of intractable seizures.  She returns today for follow-up.  She is currently on Lamictal 100 mg in the morning and 150 in the evening.  She remains on Topamax 50 mg twice a day.  She also has a vagal nerve stimulator.  Reports that she swiped her magnet 3 times a day.  She continues to have  seizures around her menstrual cycle.  On average she has approximately 1-2 seizures a month.  Reports that the most she has had is four.  She does not operate a motor vehicle.  She returns today for an evaluation  HISTORY 01/08/19:  Traci [name] is a 45 year old female with a history of seizures.  She returns today for follow-up.  She is currently on Lamictal 100 mg in the morning and 150 mg in the evening.  She is also on Topamax 50 mg twice a day.  She also has a vagal nerve stimulator.  She reports that she always has seizures around her menstrual cycle.  She states that typically 7 days before she will start feeling different.   And then she may have 3 days during her cycle that she has events.  These are typically grand mal seizures.  She states the length of the seizure  can vary from 30 secs to 2 min. She reports that she is tolerating her medication well.  She does not operate a motor vehicle.  She is able to complete all ADLs independently.  She returns today for an evaluation   REVIEW OF SYSTEMS: Out of a complete 14 system review of symptoms, the patient complains only of the following symptoms, and all other reviewed systems are negative.  See HPI-   ALLERGIES: No Known Allergies  HOME MEDICATIONS: Outpatient Medications Prior to Visit  Medication Sig Dispense Refill  . lamoTRIgine (LAMICTAL) 150 MG tablet Take 1 tablet (150 mg total) by mouth 2 (two) times daily. 180 tablet 3  . pantoprazole (PROTONIX) 40 MG tablet TAKE 1 TABLET(40 MG) BY MOUTH TWICE DAILY BEFORE A MEAL 60 tablet 11  . promethazine (PHENERGAN) 25 MG tablet Take 1 tablet (25 mg total) by mouth every 8 (eight) hours as needed for nausea or vomiting. 30 tablet 5  . topiramate (TOPAMAX) 100 MG tablet Take 0.5 tablets (50 mg total) by mouth in the morning and at bedtime. 180 tablet 3   No facility-administered medications prior to visit.    PAST MEDICAL HISTORY: Past Medical History:  Diagnosis Date  . GERD (gastroesophageal reflux disease)   . Headache   . History of kidney stones    passed stones-no surgery  . IBS (irritable bowel syndrome)   . Interstitial cystitis   . Seizures (HCC)    last one 02/2018 - on new medication    PAST SURGICAL HISTORY: Past Surgical History:  Procedure Laterality Date  . APPENDECTOMY    . CESAREAN SECTION     x 2  . CHOLECYSTECTOMY    . COLONOSCOPY     normal-2010  . KNEE SURGERY Right   . LAPAROSCOPY     endometriosis  . lipotripsy     x 1 - kidney stone removal with stent  . TUBAL LIGATION    . UPPER GASTROINTESTINAL ENDOSCOPY  12/26/2014   Erosive esophagitis (LA grade B). Small hiatal hernoa. Mild gastritis  . VAGUS NERVE STIMULATOR INSERTION N/A 07/04/2014   Procedure: VAGAL NERVE STIMULATOR IMPLANT;  Surgeon: Lisbeth Renshaw,  MD;  Location: [facility];  Service: Neurosurgery;  Laterality: N/A;    FAMILY HISTORY: Family History  Problem Relation Age of Onset  . Cancer Mother   . Diabetes Father   . Cancer Maternal Grandmother   . Cancer Maternal Grandfather   . Diabetes Paternal Grandmother   . Diabetes Paternal Grandfather   . Migraines Neg Hx   . Colon cancer Neg Hx   . Rectal  cancer Neg Hx   . Stomach cancer Neg Hx   . Esophageal cancer Neg Hx     SOCIAL HISTORY: Social History   Socioeconomic History  . Marital status: Married    Spouse name: [name]  . Number of children: 2  . Years of education: Some colle  . Highest education level: Not on file  Occupational History  . Not on file  Tobacco Use  . Smoking status: Former Smoker    Types: Cigarettes    Quit date: 04/17/2006    Years since quitting: 14.2  . Smokeless tobacco: Never Used  Vaping Use  . Vaping Use: Never used  Substance and Sexual Activity  . Alcohol use: No    Alcohol/week: 0.0 standard drinks  . Drug use: No  . Sexual activity: Yes    Birth control/protection: Surgical    Comment: Tubal Ligation  Other Topics Concern  . Not on file  Social History Narrative   Lives at home with husband, [name]   Daily caffeine consumption- 1 cup   Social Determinants of Health   Financial Resource Strain: Not on file  Food Insecurity: Not on file  Transportation Needs: Not on file  Physical Activity: Not on file  Stress: Not on file  Social Connections: Not on file  Intimate Partner Violence: Not on file      PHYSICAL EXAM  There were no vitals filed for this visit. There is no height or weight on file to calculate BMI.  Generalized: Well developed, in no acute distress   Neurological examination  Mentation: Alert oriented to time, place, history taking. Follows all commands speech and language fluent Cranial nerve : no loss of taste or smell.  Pupils were equal round reactive to light. Extraocular movements were  full,  visual field were full on confrontational test.  Head turning and shoulder shrug  were normal and symmetric. Motor: full strength of all 4 extremities and grip with symmetric motor tone is noted throughout.  Sensory: intact to vibration and soft touch No evidence of extinction is noted.  Coordination: reveals good-nose-finger bilaterally.  Gait and station: Gait is intact.  Reflexes: Deep tendon reflexes are symmetric and normal bilaterally.   DIAGNOSTIC DATA (LABS, IMAGING, TESTING) - I reviewed patient records, labs, notes, testing and imaging myself where available.  Lab Results  Component Value Date   WBC 6.7 07/09/2019   HGB 14.2 07/09/2019   HCT 43.8 07/09/2019   MCV 95 07/09/2019   PLT 320 07/09/2019      Component Value Date/Time   NA 144 07/15/2020 0917   K 4.3 07/15/2020 0917   CL 108 (H) 07/15/2020 0917   CO2 20 07/15/2020 0917   GLUCOSE 92 07/15/2020 0917   GLUCOSE 97 03/14/2018 2138   BUN 16 07/15/2020 0917   CREATININE 0.83 07/15/2020 0917   CALCIUM 9.4 07/15/2020 0917   PROT 7.0 07/15/2020 0917   ALBUMIN 4.5 07/15/2020 0917   AST 15 07/15/2020 0917   ALT 16 07/15/2020 0917   ALKPHOS 91 07/15/2020 0917   BILITOT 0.3 07/15/2020 0917   GFRNONAA 82 07/09/2019 0901   GFRAA 95 07/09/2019 0901    Lab Results  Component Value Date   TSH 1.260 05/08/2014    ASSESSMENT AND PLAN 45 y.o. year old female  has a past medical history of GERD (gastroesophageal reflux disease), Headache,  History of kidney stones, IBS (irritable bowel syndrome), Interstitial cystitis, Depression and Seizures (HCC). here with:  1.  Seizures on VNS -  We referred to Neurosurgeon for new battery - VNS running 11-23%.  Insurance changes In June.   --Continue to swipe magnet for VNS: she hadn't used the magnet since the last visit(!)  Prior to and during her menstrual cycle- I changed tachycardia detection back to 40% change in heart rate- allowing for exercise induced  tachycardia.  I increased pulse width on normal settings to 500 micro-seconds, from 250 micro sec.  -If your seizure frequency increases or you develop new symptoms please let us know, we will follow up in Novemember - we need to check on the low battery again- .  -Follow-up in person in 6 months.  I spent 20 minutes of face-to-face and non-face-to-face time with patient.  This included previsit chart review, lab review, study review, order entry, electronic health record documentation, patient education.  Melvyn Novas, MD   07/21/2020, 12:44 PM [facility] [address], [address] [place], [place] [address] [phone]

## 2020-07-21 NOTE — Telephone Encounter (Signed)
-----   Message from Melvyn Novas, MD sent at 07/16/2020  4:48 PM EDT ----- Low therapeutic level of LAMOTRIGINE.

## 2020-07-21 NOTE — Telephone Encounter (Signed)
Pt presents today for a visit to discuss her VNS. I was able to go ahead and review the lab results with her. Pt verbalized understanding. Pt had no questions at this time but was encouraged to call back if questions arise.

## 2020-07-28 ENCOUNTER — Telehealth: Payer: Self-pay

## 2020-07-28 NOTE — Telephone Encounter (Signed)
Faxed referral and notes to [facility], [phone].

## 2020-08-17 ENCOUNTER — Other Ambulatory Visit: Payer: Self-pay | Admitting: Neurosurgery

## 2020-08-25 NOTE — Progress Notes (Signed)
Surgical Instructions    Your procedure is scheduled on Friday July 1st.  Report to [facility] Main Entrance "A" at 5:30 A.M., then check in with the Admitting office.  Call this number if you have problems the morning of surgery:  [phone]   If you have any questions prior to your surgery date call [phone]: Open Monday-Friday 8am-4pm    Remember:  Do not eat or drink anything after midnight the night before your surgery     Take these medicines the morning of surgery with A SIP OF WATER  lamoTRIgine (LAMICTAL) 150 MG tablet promethazine (PHENERGAN) 25 MG tablet if needed   As of today, STOP taking any Aspirin (unless otherwise instructed by your surgeon) Aleve, Naproxen, Ibuprofen, Motrin, Advil, Goody's, BC's, all herbal medications, fish oil, and all vitamins.          Do not wear jewelry or makeup Do not wear lotions, powders, perfumes, or deodorant. Do not shave 48 hours prior to surgery.   Do not bring valuables to the hospital. DO Not wear nail polish, gel polish, artificial nails, or any other type of covering on  natural nails including finger and toenails. If patients have artificial nails, gel coating, etc. that need to be removed by a nail salon please have this removed prior to surgery or surgery may need to be canceled/delayed if the surgeon/ anesthesia feels like the patient is unable to be adequately monitored.             [facility] is not responsible for any belongings or valuables.  Do NOT Smoke (Tobacco/Vaping) or drink Alcohol 24 hours prior to your procedure If you use a CPAP at night, you may bring all equipment for your overnight stay.   Contacts, glasses, dentures or bridgework may not be worn into surgery, please bring cases for these belongings   For patients admitted to the hospital, discharge time will be determined by your treatment team.   Patients discharged the day of surgery will not be allowed to drive home, and someone needs to stay  with them for 24 hours.  ONLY 1 SUPPORT PERSON MAY BE PRESENT WHILE YOU ARE IN SURGERY. IF YOU ARE TO BE ADMITTED ONCE YOU ARE IN YOUR ROOM YOU WILL BE ALLOWED TWO (2) VISITORS.  Minor children may have two parents present. Special consideration for safety and communication needs will be reviewed on a case by case basis.  Special instructions:    Oral Hygiene is also important to reduce your risk of infection.  Remember - BRUSH YOUR TEETH THE MORNING OF SURGERY WITH YOUR REGULAR TOOTHPASTE   [facility]- Preparing For Surgery  Before surgery, you can play an important role. Because skin is not sterile, your skin needs to be as free of germs as possible. You can reduce the number of germs on your skin by washing with CHG (chlorahexidine gluconate) Soap before surgery.  CHG is an antiseptic cleaner which kills germs and bonds with the skin to continue killing germs even after washing.     Please do not use if you have an allergy to CHG or antibacterial soaps. If your skin becomes reddened/irritated stop using the CHG.  Do not shave (including legs and underarms) for at least 48 hours prior to first CHG shower. It is OK to shave your face.  Please follow these instructions carefully.     Shower the NIGHT BEFORE SURGERY and the MORNING OF SURGERY with CHG Soap.   If you chose  to wash your hair, wash your hair first as usual with your normal shampoo. After you shampoo, rinse your hair and body thoroughly to remove the shampoo.  Then [employer] and genitals (private parts) with your normal soap and rinse thoroughly to remove soap.  After that Use CHG Soap as you would any other liquid soap. You can apply CHG directly to the skin and wash gently with a scrungie or a clean washcloth.   Apply the CHG Soap to your body ONLY FROM THE NECK DOWN.  Do not use on open wounds or open sores. Avoid contact with your eyes, ears, mouth and genitals (private parts). Wash Face and genitals (private parts)  with  your normal soap.   Wash thoroughly, paying special attention to the area where your surgery will be performed.  Thoroughly rinse your body with warm water from the neck down.  DO NOT shower/wash with your normal soap after using and rinsing off the CHG Soap.  Pat yourself dry with a CLEAN TOWEL.  Wear CLEAN PAJAMAS to bed the night before surgery  Place CLEAN SHEETS on your bed the night before your surgery  DO NOT SLEEP WITH PETS.   Day of Surgery:  Take a shower with CHG soap. Wear Clean/Comfortable clothing the morning of surgery Do not apply any deodorants/lotions.   Remember to brush your teeth WITH YOUR REGULAR TOOTHPASTE.   Please read over the following fact sheets that you were given.

## 2020-08-26 ENCOUNTER — Encounter (HOSPITAL_COMMUNITY)
Admission: RE | Admit: 2020-08-26 | Discharge: 2020-08-26 | Disposition: A | Discharge status: Home / Self Care | Payer: Medicare Other | Source: Ambulatory Visit | Attending: Neurosurgery | Admitting: Neurosurgery

## 2020-08-26 ENCOUNTER — Other Ambulatory Visit: Payer: Self-pay

## 2020-08-26 ENCOUNTER — Encounter (HOSPITAL_COMMUNITY): Payer: Self-pay

## 2020-08-26 DIAGNOSIS — Z20822 Contact with and (suspected) exposure to covid-19: Secondary | ICD-10-CM | POA: Diagnosis not present

## 2020-08-26 DIAGNOSIS — Z01812 Encounter for preprocedural laboratory examination: Secondary | ICD-10-CM | POA: Diagnosis not present

## 2020-08-26 HISTORY — DX: Family history of other specified conditions: Z84.89

## 2020-08-26 HISTORY — DX: Personal history of other diseases of the digestive system: Z87.19

## 2020-08-26 HISTORY — DX: Juvenile osteochondrosis of tibia tubercle, unspecified leg: M92.529

## 2020-08-26 LAB — CBC
HCT: 43.3 % (ref 36.0–46.0)
Hemoglobin: 14.3 g/dL (ref 12.0–15.0)
MCH: 30.8 pg (ref 26.0–34.0)
MCHC: 33 g/dL (ref 30.0–36.0)
MCV: 93.1 fL (ref 80.0–100.0)
Platelets: 321 10*3/uL (ref 150–400)
RBC: 4.65 MIL/uL (ref 3.87–5.11)
RDW: 12.5 % (ref 11.5–15.5)
WBC: 6.4 10*3/uL (ref 4.0–10.5)
nRBC: 0 % (ref 0.0–0.2)

## 2020-08-26 LAB — SARS CORONAVIRUS 2 (TAT 6-24 HRS): SARS Coronavirus 2: NEGATIVE

## 2020-08-26 NOTE — Progress Notes (Signed)
PCP - Lucila Maine, MD Cardiologist - denies  Chest x-ray - Done in [place] "a few years ago"  Sleep Study - No OSA  DM- denies   COVID TEST- 08-26-20   Anesthesia review: No  Patient denies shortness of breath, fever, cough and chest pain at PAT appointment   All instructions explained to the patient, with a verbal understanding of the material. Patient agrees to go over the instructions while at home for a better understanding. Patient also instructed to wear a mask in public after being tested for COVID-19. The opportunity to ask questions was provided.

## 2020-08-28 ENCOUNTER — Ambulatory Visit (HOSPITAL_COMMUNITY): Payer: Medicare Other | Admitting: Certified Registered Nurse Anesthetist

## 2020-08-28 ENCOUNTER — Ambulatory Visit (HOSPITAL_COMMUNITY)
Admission: RE | Admit: 2020-08-28 | Discharge: 2020-08-28 | Disposition: A | Discharge status: Home / Self Care | Payer: Medicare Other | Attending: Neurosurgery | Admitting: Neurosurgery

## 2020-08-28 ENCOUNTER — Encounter (HOSPITAL_COMMUNITY)
Admission: RE | Disposition: A | Discharge status: Home / Self Care | Payer: Self-pay | Source: Home / Self Care | Attending: Neurosurgery

## 2020-08-28 DIAGNOSIS — Z8616 Personal history of COVID-19: Secondary | ICD-10-CM | POA: Diagnosis not present

## 2020-08-28 DIAGNOSIS — G40919 Epilepsy, unspecified, intractable, without status epilepticus: Secondary | ICD-10-CM | POA: Insufficient documentation

## 2020-08-28 DIAGNOSIS — Z87891 Personal history of nicotine dependence: Secondary | ICD-10-CM | POA: Insufficient documentation

## 2020-08-28 DIAGNOSIS — Z4549 Encounter for adjustment and management of other implanted nervous system device: Secondary | ICD-10-CM | POA: Insufficient documentation

## 2020-08-28 DIAGNOSIS — Z79899 Other long term (current) drug therapy: Secondary | ICD-10-CM | POA: Diagnosis not present

## 2020-08-28 HISTORY — PX: VAGUS NERVE STIMULATOR INSERTION: SHX348

## 2020-08-28 HISTORY — DX: Morbid (severe) obesity due to excess calories: E66.01

## 2020-08-28 LAB — POCT PREGNANCY, URINE: Preg Test, Ur: NEGATIVE

## 2020-08-28 SURGERY — VAGAL NERVE STIMULATOR IMPLANT
Anesthesia: General | Site: Chest

## 2020-08-28 MED ORDER — CHLORHEXIDINE GLUCONATE CLOTH 2 % EX PADS: 6.0000 | MEDICATED_PAD | Freq: Once | CUTANEOUS | Status: DC

## 2020-08-28 MED ORDER — MIDAZOLAM HCL 2 MG/2ML IJ SOLN
INTRAMUSCULAR | Status: DC | PRN
  Administered 2020-08-28: 2 mg via INTRAVENOUS

## 2020-08-28 MED ORDER — PROMETHAZINE HCL 25 MG/ML IJ SOLN
6.2500 mg | INTRAMUSCULAR | Status: DC | PRN
  Administered 2020-08-28: 12.5 mg via INTRAVENOUS

## 2020-08-28 MED ORDER — PROMETHAZINE HCL 25 MG/ML IJ SOLN
INTRAMUSCULAR | Status: AC
  Filled 2020-08-28: qty 1

## 2020-08-28 MED ORDER — CHLORHEXIDINE GLUCONATE 0.12 % MT SOLN
15.0000 mL | Freq: Once | OROMUCOSAL | Status: AC
  Administered 2020-08-28: 15 mL via OROMUCOSAL
  Filled 2020-08-28: qty 15

## 2020-08-28 MED ORDER — LACTATED RINGERS IV SOLN: INTRAVENOUS | Status: DC | PRN

## 2020-08-28 MED ORDER — FENTANYL CITRATE (PF) 250 MCG/5ML IJ SOLN
INTRAMUSCULAR | Status: DC | PRN
  Administered 2020-08-28 (×5): 50 ug via INTRAVENOUS

## 2020-08-28 MED ORDER — TRAMADOL HCL 50 MG PO TABS
50.0000 mg | ORAL_TABLET | Freq: Once | ORAL | Status: AC
  Administered 2020-08-28: 50 mg via ORAL

## 2020-08-28 MED ORDER — PROPOFOL 10 MG/ML IV BOLUS
INTRAVENOUS | Status: AC
  Filled 2020-08-28: qty 20

## 2020-08-28 MED ORDER — OXYCODONE HCL 5 MG PO TABS: 5.0000 mg | ORAL_TABLET | Freq: Once | ORAL | Status: DC | PRN

## 2020-08-28 MED ORDER — LIDOCAINE-EPINEPHRINE 1 %-1:100000 IJ SOLN
INTRAMUSCULAR | Status: DC | PRN
  Administered 2020-08-28: 3.5 mL

## 2020-08-28 MED ORDER — TOPIRAMATE 100 MG PO TABS
100.0000 mg | ORAL_TABLET | ORAL | Status: AC
  Administered 2020-08-28: 100 mg via ORAL
  Filled 2020-08-28 (×2): qty 1

## 2020-08-28 MED ORDER — DEXAMETHASONE SODIUM PHOSPHATE 10 MG/ML IJ SOLN
INTRAMUSCULAR | Status: DC | PRN
  Administered 2020-08-28: 8 mg via INTRAVENOUS

## 2020-08-28 MED ORDER — BUPIVACAINE HCL (PF) 0.5 % IJ SOLN
INTRAMUSCULAR | Status: AC
  Filled 2020-08-28: qty 30

## 2020-08-28 MED ORDER — CEFAZOLIN SODIUM-DEXTROSE 2-4 GM/100ML-% IV SOLN
2.0000 g | INTRAVENOUS | Status: AC
  Administered 2020-08-28: 2 g via INTRAVENOUS
  Filled 2020-08-28: qty 100

## 2020-08-28 MED ORDER — TRAMADOL HCL 50 MG PO TABS: 50.0000 mg | ORAL_TABLET | Freq: Four times a day (QID) | ORAL | 0 refills | Status: AC | PRN

## 2020-08-28 MED ORDER — MIDAZOLAM HCL 2 MG/2ML IJ SOLN
INTRAMUSCULAR | Status: AC
  Filled 2020-08-28: qty 2

## 2020-08-28 MED ORDER — ONDANSETRON HCL 4 MG/2ML IJ SOLN
INTRAMUSCULAR | Status: DC | PRN
  Administered 2020-08-28: 4 mg via INTRAVENOUS

## 2020-08-28 MED ORDER — ORAL CARE MOUTH RINSE: 15.0000 mL | Freq: Once | OROMUCOSAL | Status: AC

## 2020-08-28 MED ORDER — KETOROLAC TROMETHAMINE 30 MG/ML IJ SOLN: 30.0000 mg | Freq: Once | INTRAMUSCULAR | Status: DC | PRN

## 2020-08-28 MED ORDER — DEXAMETHASONE SODIUM PHOSPHATE 10 MG/ML IJ SOLN
INTRAMUSCULAR | Status: AC
  Filled 2020-08-28: qty 1

## 2020-08-28 MED ORDER — THROMBIN (RECOMBINANT) 5000 UNITS EX SOLR
CUTANEOUS | Status: AC
  Filled 2020-08-28: qty 5000

## 2020-08-28 MED ORDER — BUPIVACAINE HCL 0.5 % IJ SOLN
INTRAMUSCULAR | Status: DC | PRN
  Administered 2020-08-28: 3.5 mL

## 2020-08-28 MED ORDER — LIDOCAINE-EPINEPHRINE 1 %-1:100000 IJ SOLN
INTRAMUSCULAR | Status: AC
  Filled 2020-08-28: qty 1

## 2020-08-28 MED ORDER — LIDOCAINE 2% (20 MG/ML) 5 ML SYRINGE
INTRAMUSCULAR | Status: DC | PRN
  Administered 2020-08-28: 100 mg via INTRAVENOUS

## 2020-08-28 MED ORDER — MEPERIDINE HCL 25 MG/ML IJ SOLN: 6.2500 mg | INTRAMUSCULAR | Status: DC | PRN

## 2020-08-28 MED ORDER — PROPOFOL 10 MG/ML IV BOLUS
INTRAVENOUS | Status: DC | PRN
  Administered 2020-08-28: 200 mg via INTRAVENOUS

## 2020-08-28 MED ORDER — PHENYLEPHRINE 40 MCG/ML (10ML) SYRINGE FOR IV PUSH (FOR BLOOD PRESSURE SUPPORT)
PREFILLED_SYRINGE | INTRAVENOUS | Status: DC | PRN
  Administered 2020-08-28: 80 ug via INTRAVENOUS

## 2020-08-28 MED ORDER — ONDANSETRON HCL 4 MG/2ML IJ SOLN
INTRAMUSCULAR | Status: AC
  Filled 2020-08-28: qty 2

## 2020-08-28 MED ORDER — TRAMADOL HCL 50 MG PO TABS
ORAL_TABLET | ORAL | Status: AC
  Filled 2020-08-28: qty 1

## 2020-08-28 MED ORDER — LACTATED RINGERS IV SOLN: INTRAVENOUS | Status: DC

## 2020-08-28 MED ORDER — 0.9 % SODIUM CHLORIDE (POUR BTL) OPTIME
TOPICAL | Status: DC | PRN
  Administered 2020-08-28: 1000 mL

## 2020-08-28 MED ORDER — OXYCODONE HCL 5 MG/5ML PO SOLN: 5.0000 mg | Freq: Once | ORAL | Status: DC | PRN

## 2020-08-28 MED ORDER — FENTANYL CITRATE (PF) 250 MCG/5ML IJ SOLN
INTRAMUSCULAR | Status: AC
  Filled 2020-08-28: qty 5

## 2020-08-28 MED ORDER — LIDOCAINE 2% (20 MG/ML) 5 ML SYRINGE
INTRAMUSCULAR | Status: AC
  Filled 2020-08-28: qty 5

## 2020-08-28 MED ORDER — GLYCOPYRROLATE PF 0.2 MG/ML IJ SOSY
PREFILLED_SYRINGE | INTRAMUSCULAR | Status: AC
  Filled 2020-08-28: qty 1

## 2020-08-28 MED ORDER — HYDROMORPHONE HCL 1 MG/ML IJ SOLN: 0.2500 mg | INTRAMUSCULAR | Status: DC | PRN

## 2020-08-28 SURGICAL SUPPLY — 58 items
ADH SKN CLS APL DERMABOND .7 (GAUZE/BANDAGES/DRESSINGS) ×1
APL SKNCLS STERI-STRIP NONHPOA (GAUZE/BANDAGES/DRESSINGS)
BAG COUNTER SPONGE SURGICOUNT (BAG) ×2 IMPLANT
BAG SPNG CNTER NS LX DISP (BAG) ×1
BAG SURGICOUNT SPONGE COUNTING (BAG) ×1
BENZOIN TINCTURE PRP APPL 2/3 (GAUZE/BANDAGES/DRESSINGS) IMPLANT
BLADE CLIPPER SURG (BLADE) IMPLANT
BLADE SURG 11 STRL SS (BLADE) ×3 IMPLANT
CANISTER SUCT 3000ML PPV (MISCELLANEOUS) ×3 IMPLANT
CARTRIDGE OIL MAESTRO DRILL (MISCELLANEOUS) IMPLANT
DECANTER SPIKE VIAL GLASS SM (MISCELLANEOUS) ×1 IMPLANT
DERMABOND ADVANCED (GAUZE/BANDAGES/DRESSINGS) ×2
DERMABOND ADVANCED .7 DNX12 (GAUZE/BANDAGES/DRESSINGS) ×1 IMPLANT
DIFFUSER DRILL AIR PNEUMATIC (MISCELLANEOUS) IMPLANT
DRAPE CAMERA VIDEO/LASER (DRAPES) ×3 IMPLANT
DRAPE HALF SHEET 40X57 (DRAPES) IMPLANT
DRAPE LAPAROTOMY 100X72 PEDS (DRAPES) ×3 IMPLANT
DRSG OPSITE 4X5.5 SM (GAUZE/BANDAGES/DRESSINGS) ×4 IMPLANT
DRSG OPSITE POSTOP 3X4 (GAUZE/BANDAGES/DRESSINGS) IMPLANT
DRSG OPSITE POSTOP 4X6 (GAUZE/BANDAGES/DRESSINGS) ×2 IMPLANT
DRSG TEGADERM 4X4.75 (GAUZE/BANDAGES/DRESSINGS) ×12 IMPLANT
DURAPREP 6ML APPLICATOR 50/CS (WOUND CARE) ×3 IMPLANT
ELECT COATED BLADE 2.86 ST (ELECTRODE) ×3 IMPLANT
ELECT REM PT RETURN 9FT ADLT (ELECTROSURGICAL) ×3
ELECTRODE REM PT RTRN 9FT ADLT (ELECTROSURGICAL) ×1 IMPLANT
GAUZE 4X4 16PLY ~~LOC~~+RFID DBL (SPONGE) IMPLANT
GENERATOR MODEL 106 ASPIRE (Neuro Prosthesis/Implant) ×2 IMPLANT
GLOVE SURG ENC MOIS LTX SZ7.5 (GLOVE) ×3 IMPLANT
GLOVE SURG LTX SZ7 (GLOVE) ×3 IMPLANT
GLOVE SURG POLYISO LF SZ7 (GLOVE) ×2 IMPLANT
GLOVE SURG UNDER POLY LF SZ6.5 (GLOVE) ×6 IMPLANT
GLOVE SURG UNDER POLY LF SZ7.5 (GLOVE) ×6 IMPLANT
GOWN STRL REUS W/ TWL LRG LVL3 (GOWN DISPOSABLE) ×2 IMPLANT
GOWN STRL REUS W/ TWL XL LVL3 (GOWN DISPOSABLE) IMPLANT
GOWN STRL REUS W/TWL 2XL LVL3 (GOWN DISPOSABLE) IMPLANT
GOWN STRL REUS W/TWL LRG LVL3 (GOWN DISPOSABLE) ×6
GOWN STRL REUS W/TWL XL LVL3 (GOWN DISPOSABLE)
HEMOSTAT POWDER KIT SURGIFOAM (HEMOSTASIS) IMPLANT
KIT BASIN OR (CUSTOM PROCEDURE TRAY) ×3 IMPLANT
KIT TURNOVER KIT B (KITS) ×3 IMPLANT
LOOP VESSEL MAXI BLUE (MISCELLANEOUS) IMPLANT
LOOP VESSEL MINI RED (MISCELLANEOUS) IMPLANT
NEEDLE HYPO 22GX1.5 SAFETY (NEEDLE) ×3 IMPLANT
NS IRRIG 1000ML POUR BTL (IV SOLUTION) ×3 IMPLANT
OIL CARTRIDGE MAESTRO DRILL (MISCELLANEOUS)
PACK LAMINECTOMY NEURO (CUSTOM PROCEDURE TRAY) ×3 IMPLANT
PAD ARMBOARD 7.5X6 YLW CONV (MISCELLANEOUS) ×15 IMPLANT
SPONGE INTESTINAL PEANUT (DISPOSABLE) IMPLANT
SPONGE SURGIFOAM ABS GEL SZ50 (HEMOSTASIS) ×3 IMPLANT
SUT ETHILON 3 0 FSL (SUTURE) IMPLANT
SUT NURALON 4 0 TR CR/8 (SUTURE) ×3 IMPLANT
SUT VIC AB 0 CT1 27 (SUTURE) ×3
SUT VIC AB 0 CT1 27XBRD ANBCTR (SUTURE) ×1 IMPLANT
SUT VIC AB 3-0 SH 8-18 (SUTURE) ×5 IMPLANT
SUT VICRYL 3-0 RB1 18 ABS (SUTURE) ×8 IMPLANT
TOWEL GREEN STERILE (TOWEL DISPOSABLE) ×3 IMPLANT
TOWEL GREEN STERILE FF (TOWEL DISPOSABLE) ×3 IMPLANT
WATER STERILE IRR 1000ML POUR (IV SOLUTION) ×3 IMPLANT

## 2020-08-28 NOTE — Discharge Summary (Addendum)
  Physician Discharge Summary  Patient ID: Traci [name] MRN: [id number] DOB/AGE: [birth year] 45 y.o.  Admit date: 08/28/2020 Discharge date: 08/28/2020  Admission Diagnoses:  VNS end-of-life Medically intractable epilepsy  Discharge Diagnoses:  Same Active Problems:   * No active hospital problems. *   Discharged Condition: Stable  Hospital Course:  Traci [name] is a 45 y.o. female who underwent uncomplicated replacement of a VNS battery. She was at baseline postop and discharged in stable condition.  Treatments: Surgery - Replacement of VNS battery  Discharge Exam: Blood pressure 120/81, pulse 86, temperature 97.7 F (36.5 C), resp. rate 18, height 5\' 2"  (1.575 m), weight 102.1 kg, last menstrual period 08/23/2020, SpO2 98 %. Awake, alert, oriented Speech fluent, appropriate CN grossly intact 5/5 BUE/BLE Wound c/d/i  Disposition: Discharge disposition: 01-Home or Self Care      Discharge Instructions     Call MD for:  redness, tenderness, or signs of infection (pain, swelling, redness, odor or green/yellow discharge around incision site)   Complete by: As directed    Call MD for:  temperature >100.4   Complete by: As directed    Diet - low sodium heart healthy   Complete by: As directed    Discharge instructions   Complete by: As directed    Walk at home as much as possible, at least 4 times / day   Increase activity slowly   Complete by: As directed    Lifting restrictions   Complete by: As directed    No lifting > 10 lbs   May shower / Bathe   Complete by: As directed    48 hours after surgery   May walk up steps   Complete by: As directed    Other Restrictions   Complete by: As directed    No bending/twisting at waist   Remove dressing in 48 hours   Complete by: As directed       Allergies as of 08/28/2020   No Known Allergies      Medication List     TAKE these medications    lamoTRIgine 150 MG tablet Commonly known as:  LAMICTAL Take 1 tablet (150 mg total) by mouth 2 (two) times daily.   promethazine 25 MG tablet Commonly known as: PHENERGAN Take 1 tablet (25 mg total) by mouth every 8 (eight) hours as needed for nausea or vomiting.   topiramate 100 MG tablet Commonly known as: TOPAMAX Take 0.5 tablets (50 mg total) by mouth in the morning and at bedtime.   traMADol 50 MG tablet Commonly known as: Ultram Take 1 tablet (50 mg total) by mouth every 6 (six) hours as needed for up to 3 days.       ASK your doctor about these medications    pantoprazole 40 MG tablet Commonly known as: PROTONIX TAKE 1 TABLET(40 MG) BY MOUTH TWICE DAILY BEFORE A MEAL        Follow-up Information     10/29/2020, MD Follow up in 2 week(s).   Specialty: Neurosurgery Why: For wound re-check Contact information: [address] [place] [phone]                 Signed: [phone] 08/28/2020, 9:35 AM

## 2020-08-28 NOTE — Anesthesia Procedure Notes (Signed)
Procedure Name: LMA Insertion Date/Time: 08/28/2020 8:38 AM Performed by: Modena Morrow, CRNA Pre-anesthesia Checklist: Patient identified, Emergency Drugs available, Suction available and Patient being monitored Patient Re-evaluated:Patient Re-evaluated prior to induction Oxygen Delivery Method: Circle system utilized Preoxygenation: Pre-oxygenation with 100% oxygen Induction Type: IV induction Ventilation: Mask ventilation without difficulty LMA: LMA inserted LMA Size: 4.0 Number of attempts: 1 Placement Confirmation: positive ETCO2 Tube secured with: Tape Dental Injury: Teeth and Oropharynx as per pre-operative assessment

## 2020-08-28 NOTE — Anesthesia Preprocedure Evaluation (Addendum)
Anesthesia Evaluation  Patient identified by MRN, date of birth, ID band Patient awake    Reviewed: Allergy & Precautions, NPO status , Patient's Chart, lab work & pertinent test results  Airway Mallampati: III  TM Distance: >3 FB Neck ROM: Full    Dental no notable dental hx. (+) Teeth Intact, Dental Advisory Given   Pulmonary neg pulmonary ROS, former smoker,    Pulmonary exam normal breath sounds clear to auscultation       Cardiovascular negative cardio ROS Normal cardiovascular exam Rhythm:Regular Rate:Normal     Neuro/Psych  Headaches, Seizures - (vagal nerve stimulator ), Well Controlled,  PSYCHIATRIC DISORDERS Depression Grand mal seizures monthly, around menstrual cycle- does not take anything to break them     GI/Hepatic Neg liver ROS, hiatal hernia, GERD  Medicated and Controlled,  Endo/Other  Morbid obesityBMI 41  Renal/GU negative Renal ROS  negative genitourinary   Musculoskeletal negative musculoskeletal ROS (+)   Abdominal (+) + obese,   Peds negative pediatric ROS (+)  Hematology negative hematology ROS (+)   Anesthesia Other Findings   Reproductive/Obstetrics negative OB ROS                            Anesthesia Physical Anesthesia Plan  ASA: 3  Anesthesia Plan: General   Post-op Pain Management:    Induction: Intravenous  PONV Risk Score and Plan: 3 and Ondansetron, Dexamethasone, Midazolam and Treatment may vary due to age or medical condition  Airway Management Planned: LMA  Additional Equipment: None  Intra-op Plan:   Post-operative Plan: Extubation in OR  Informed Consent: I have reviewed the patients History and Physical, chart, labs and discussed the procedure including the risks, benefits and alternatives for the proposed anesthesia with the patient or authorized representative who has indicated his/her understanding and acceptance.     Dental  advisory given  Plan Discussed with: CRNA  Anesthesia Plan Comments: (Took lamictal this AM, did not take topamax- will get AM PO dose in prior to [facility])       Anesthesia Quick Evaluation

## 2020-08-28 NOTE — H&P (Signed)
Chief Complaint   VNS battery end-of-life  History of Present Illness  Traci [name] is a 45 y.o. female with a history of medically intractable epilepsy previously underwent placement of a VNS. She has had good therapeutic effect and recent interrogation has revealed near end-of-life of the battery.  Past Medical History   Past Medical History:  Diagnosis Date   COVID 2021   also had in 2020   Family history of adverse reaction to anesthesia    Father had nausea post anesthesia   GERD (gastroesophageal reflux disease)    Headache    History of hiatal hernia    History of kidney stones    passed stones-no surgery   IBS (irritable bowel syndrome)    Interstitial cystitis    Osgood-Schlatter's disease    "diagnosed years ago"   Pneumonia 2014   Seizures (HCC)    last one 02/2018 - on new medication    Past Surgical History   Past Surgical History:  Procedure Laterality Date   APPENDECTOMY     CESAREAN SECTION     x 2   CHOLECYSTECTOMY     COLONOSCOPY     normal-2010   DILATION AND CURETTAGE OF UTERUS  2008   KNEE SURGERY Right    x3   LAPAROSCOPY     endometriosis   lipotripsy     x 1 - kidney stone removal with stent   TUBAL LIGATION     UPPER GASTROINTESTINAL ENDOSCOPY  12/26/2014   Erosive esophagitis (LA grade B). Small hiatal hernoa. Mild gastritis   VAGUS NERVE STIMULATOR INSERTION N/A 07/04/2014   Procedure: VAGAL NERVE STIMULATOR IMPLANT;  Surgeon: Lisbeth Renshaw, MD;  Location: [facility];  Service: Neurosurgery;  Laterality: N/A;    Social History   Social History   Tobacco Use   Smoking status: Former    Pack years: 0.00    Types: Cigarettes    Quit date: 1999    Years since quitting: 23.5   Smokeless tobacco: Never  Vaping Use   Vaping Use: Never used  Substance Use Topics   Alcohol use: Yes    Comment: social   Drug use: No    Medications   Prior to Admission medications   Medication Sig Start Date End Date Taking?  Authorizing Provider  lamoTRIgine (LAMICTAL) 150 MG tablet Take 1 tablet (150 mg total) by mouth 2 (two) times daily. 07/15/20  Yes Dohmeier, Porfirio Mylar, MD  pantoprazole (PROTONIX) 40 MG tablet TAKE 1 TABLET(40 MG) BY MOUTH TWICE DAILY BEFORE A MEAL Patient taking differently: Take 40 mg by mouth 2 (two) times daily before a meal. TAKE 1 TABLET(40 MG) BY MOUTH TWICE DAILY BEFORE A MEAL 01/12/20  Yes Lynann Bologna, MD  promethazine (PHENERGAN) 25 MG tablet Take 1 tablet (25 mg total) by mouth every 8 (eight) hours as needed for nausea or vomiting. 01/09/20  Yes Dohmeier, Porfirio Mylar, MD  topiramate (TOPAMAX) 100 MG tablet Take 0.5 tablets (50 mg total) by mouth in the morning and at bedtime. 07/15/20  Yes Dohmeier, Porfirio Mylar, MD    Allergies  No Known Allergies  Review of Systems  ROS  Neurologic Exam  Awake, alert, oriented Memory and concentration grossly intact Speech fluent, appropriate CN grossly intact Motor exam: Upper Extremities Deltoid Bicep Tricep Grip  Right 5/5 5/5 5/5 5/5  Left 5/5 5/5 5/5 5/5   Lower Extremities IP Quad PF DF EHL  Right 5/5 5/5 5/5 5/5 5/5  Left 5/5 5/5 5/5 5/5 5/5  Sensation grossly intact to LT  Impression  - 45 y.o. female several years s/p VNS placement with good therapeutic effect and recent interrogation revealing near end-of-life of the generator  Plan  - Proceed with VNS battery replacement  Risks, benefits, and alternatives to the surgery as well as expected postop course and recovery were  reviewed in the office. All questions today were answered and consent was obtained.   Lisbeth Renshaw, MD [facility]

## 2020-08-28 NOTE — Transfer of Care (Signed)
Immediate Anesthesia Transfer of Care Note  Patient: Traci [name]  Procedure(s) Performed: VAGUS NERVE STIMULATOR BATTERY REPLACEMENT (Chest)  Patient Location: PACU  Anesthesia Type:General  Level of Consciousness: drowsy and patient cooperative  Airway & Oxygen Therapy: Patient Spontanous Breathing and Patient connected to face mask oxygen  Post-op Assessment: Report given to RN and Post -op Vital signs reviewed and stable  Post vital signs: Reviewed and stable  Last Vitals:  Vitals Value Taken Time  BP 127/80 08/28/20 0915  Temp    Pulse 95 08/28/20 0916  Resp 17 08/28/20 0916  SpO2 100 % 08/28/20 0916  Vitals shown include unvalidated device data.  Last Pain:  Vitals:   08/28/20 0617  TempSrc:   PainSc: 0-No pain         Complications: No notable events documented.

## 2020-08-28 NOTE — Anesthesia Postprocedure Evaluation (Signed)
Anesthesia Post Note  Patient: Traci [name]  Procedure(s) Performed: VAGUS NERVE STIMULATOR BATTERY REPLACEMENT (Chest)     Patient location during evaluation: PACU Anesthesia Type: General Level of consciousness: awake and alert, oriented and patient cooperative Pain management: pain level controlled Vital Signs Assessment: post-procedure vital signs reviewed and stable Respiratory status: spontaneous breathing, nonlabored ventilation and respiratory function stable Cardiovascular status: blood pressure returned to baseline and stable Postop Assessment: no apparent nausea or vomiting Anesthetic complications: no   No notable events documented.  Last Vitals:  Vitals:   08/28/20 0930 08/28/20 0945  BP: 120/81 129/89  Pulse: 86 84  Resp: 18 17  Temp:  (!) 36.4 C  SpO2: 98% 100%    Last Pain:  Vitals:   08/28/20 0945  TempSrc:   PainSc: 0-No pain                 Lannie Fields

## 2020-08-28 NOTE — Op Note (Signed)
  NEUROSURGERY OPERATIVE NOTE   PREOP DIAGNOSIS:  VNS battery end-of-life Medically intractable epilepsy   POSTOP DIAGNOSIS: Same  PROCEDURE: 1. Replacement of left Vagal nerve stimulator battery  SURGEON: Dr. Lisbeth Renshaw, MD  ASSISTANT: None  ANESTHESIA: General Endotracheal  EBL: Minimal  SPECIMENS: None  DRAINS: None  COMPLICATIONS: None immediate  CONDITION: Hemodynamically stable to PACU  HISTORY: Traci [name] is a 45 y.o. female who was initially seen in the outpatient clinic as a referral from neurology for medically intractable epilepsy. She underwent placement of a VNS several years ago. Recent interrogation revealed near end-of-life of the battery. The risks and benefits of the surgery were reviewed in detail. After all questions were answered, informed consent was obtained.  PROCEDURE IN DETAIL: After informed consent was obtained and witnessed, the patient was brought to the operating room. After induction of general anesthesia, the patient was positioned on the operative table in the supine position. All pressure points were meticulously padded. Left infraclavicular skin incision was then marked out and prepped and draped in the usual sterile fashion.  After timeout was conducted, skin incision was infiltrated with local anesthetic with epinephrine. Skin incision was then made sharply, the subcutaneous pocket was opened and the generator identified. This was explanted. The lead was removed and the new generator connected and replaced in the subcutaneous pocket. The new generator was interrogated and noted to have normal lead impedence, accurate hear rate detection and was reprogrammed with the previous settings. Wound was then irrigated with saline and closed with interrupted 3-0 Vicryl stitches. Skin was closed with dermabond.  At the end of the case all sponge, needle, cottonoid, and instrument counts were correct. The patient was then extubated,  transferred to the stretcher, and taken to the postanesthesia care unit in stable hemodynamic condition.   Lisbeth Renshaw, MD [facility]

## 2020-09-01 ENCOUNTER — Encounter (HOSPITAL_COMMUNITY): Payer: Self-pay | Admitting: Neurosurgery

## 2020-10-04 ENCOUNTER — Other Ambulatory Visit: Payer: Self-pay | Admitting: Adult Health

## 2020-12-15 ENCOUNTER — Ambulatory Visit: Payer: Medicare Other | Admitting: Neurology

## 2020-12-15 ENCOUNTER — Encounter: Payer: Self-pay | Admitting: Neurology

## 2020-12-15 VITALS — BP 102/70 | HR 73 | Ht 62.0 in | Wt 231.0 lb

## 2020-12-15 DIAGNOSIS — G43821 Menstrual migraine, not intractable, with status migrainosus: Secondary | ICD-10-CM

## 2020-12-15 DIAGNOSIS — Z9689 Presence of other specified functional implants: Secondary | ICD-10-CM | POA: Diagnosis not present

## 2020-12-15 DIAGNOSIS — G40B19 Juvenile myoclonic epilepsy, intractable, without status epilepticus: Secondary | ICD-10-CM

## 2020-12-15 DIAGNOSIS — G40509 Epileptic seizures related to external causes, not intractable, without status epilepticus: Secondary | ICD-10-CM

## 2020-12-15 MED ORDER — LAMOTRIGINE 150 MG PO TABS: 150.0000 mg | ORAL_TABLET | Freq: Two times a day (BID) | ORAL | 3 refills | Status: DC

## 2020-12-15 MED ORDER — PROMETHAZINE HCL 25 MG PO TABS: 25.0000 mg | ORAL_TABLET | Freq: Three times a day (TID) | ORAL | 5 refills | Status: DC | PRN

## 2020-12-15 MED ORDER — TOPIRAMATE 100 MG PO TABS: 50.0000 mg | ORAL_TABLET | Freq: Two times a day (BID) | ORAL | 3 refills | Status: DC

## 2020-12-15 NOTE — Progress Notes (Signed)
PATIENT: Traci [name] DOB: 1975-11-26  REASON FOR VISIT: follow up q 6 month.  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 We referred  this established VNS patient to Neurosurgeon for new battery - VNS was running 11-23%. New Battery was placed in July 22.    25.22 ohms lead impedence.   She Continues to swipe magnet for VNS: Now uses magnet every day and has had the " expected " menstrual cycle related seizures , but short,   Prior to and during her menstrual cycle- I had changed tachycardia detection back to 40% change in heart rate- allowing for exercise induced tachycardia. She feels not that there have been any stimulations due to physical activity induced tachycardia. No false positives. The new battery and new VNS stimulation detection allows to get the more sensitive stimulation , we can drop to 30 % today.   I had increased pulse width on normal settings to 500 micro-seconds, from 250 micro sec.  We left that setting in place.   We increased a output current by 0.125 mA to Magnet ( 0.875 mA) and normal mode ( now 1.625), autostimulation at 1.75 m A.   CPT code is [phone]   - 07-15-2020: CD, patient reports an incident , [place] late [place] 2022[place] Did write down the scores, and suddenly was on the floor, no aura, lasted 1-2 minutes and not a long postictum- 5-8 minutes to re-orientate herself. Not related to menstrual cycle.  The first seizure in a long time.  I could hear his current settings, the output is set to 1.5 mA outer stimulation to 1.625 mA and the magnet strength to 1.75 mA the frequency is 20 Hz.  Pulse width is 500 s for the auto stimulation 20 to 50 s for the magnet 250 s.  On time 30 seconds for the normal baseline auto stimulation 60 seconds Magnet 60 seconds.  Of time is 3 minutes duty cycle is 16%.  Battery at this time is over 25%.  Lead impedance is 2516 home.  Output current 1.5 mA escorted  above.  There were no magnet stimulations today.  Auto stim events 20.47/day. We discussed regular magnet use. She has been reset to 40 % heart rate increase threshold. all functions were screened and the patient VNS has only 11-255 of battery power. I would like to set her up for replacement in the next 6-8 month.      04/14/2020 with Np [place], adjusted VNS settings.   01/09/20: CD Here with husband, rm 10. presents today for follow up. She reports  overall been doing well. 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   She has been  Working out again, helps mood. Lost 40 pounds.  she states average month she usually only has 1 seizure, maybe 2. The month of november she has had 3, but states that she has also had a change in her menstrual cycle so she feels they may be hormonal. VNS interrogation. Last seizure described at 20-30 sec duration , just this week. Changes in menstrual cycle- usually very regularly, it was 21 days late and now it is only 2 days long. No spotting. More headaches. More depression-  Lamictal as raised last visit to 150 mg bid by Butch Penny, NP and continues Topamax 50 mg bid. VNS patient.  Here for interrogation.  The venous stimulator was implanted on 03 Jul 2014 is a expected battery life  of a decade.  Her current settings are output current of 1.5 mA signal frequency of 20 Hz pulse width of 250 s signal on time duration is 30 seconds and signal of time is 3 minutes between signals.   The measurement has remained at an output current of 1.75 mA pulse width of 250 signal on time of 60 seconds.  Also stimulation is set to an output current of 1.6 to 5 mA pulse width of 250 s signal on time of 1 minute.  And tachycardia detection is 1 with a heart rate sensitivity level of 3 threshold for auto stimulation is a change in heart rate by 40%.      Traci [name] is a 45 year old female with a history of intractable seizures.  She returns today for follow-up.  She is  currently on Lamictal 100 mg in the morning and 150 in the evening.  She remains on Topamax 50 mg twice a day.  She also has a vagal nerve stimulator.  Reports that she swiped her magnet 3 times a day.  She continues to have seizures around her menstrual cycle.  On average she has approximately 1-2 seizures a month.  Reports that the most she has had is four.  She does not operate a motor vehicle.  She returns today for an evaluation  HISTORY 01/08/19:   Traci [name] is a 45 year old female with a history of seizures.  She returns today for follow-up.  She is currently on Lamictal 100 mg in the morning and 150 mg in the evening.  She is also on Topamax 50 mg twice a day.  She also has a vagal nerve stimulator.  She reports that she always has seizures around her menstrual cycle.  She states that typically 7 days before she will start feeling different.   And then she may have 3 days during her cycle that she has events.  These are typically grand mal seizures.  She states the length of the seizure can vary from 30 secs to 2 min. She reports that she is tolerating her medication well.  She does not operate a motor vehicle.  She is able to complete all ADLs independently.  She returns today for an evaluation   REVIEW OF SYSTEMS: Out of a complete 14 system review of symptoms, the patient complains only of the following symptoms, and all other reviewed systems are negative.  See HPI-   ALLERGIES: No Known Allergies  HOME MEDICATIONS: Outpatient Medications Prior to Visit  Medication Sig Dispense Refill   lamoTRIgine (LAMICTAL) 150 MG tablet Take 1 tablet (150 mg total) by mouth 2 (two) times daily. 180 tablet 3   pantoprazole (PROTONIX) 40 MG tablet TAKE 1 TABLET(40 MG) BY MOUTH TWICE DAILY BEFORE A MEAL (Patient taking differently: Take 40 mg by mouth 2 (two) times daily before a meal. TAKE 1 TABLET(40 MG) BY MOUTH TWICE DAILY BEFORE A MEAL) 60 tablet 11   promethazine (PHENERGAN) 25 MG tablet Take 1  tablet (25 mg total) by mouth every 8 (eight) hours as needed for nausea or vomiting. 30 tablet 5   topiramate (TOPAMAX) 100 MG tablet Take 0.5 tablets (50 mg total) by mouth in the morning and at bedtime. 180 tablet 3   No facility-administered medications prior to visit.    PAST MEDICAL HISTORY: Past Medical History:  Diagnosis Date   COVID 2021   also had in 2020   Family history of adverse reaction to anesthesia    Father had nausea  post anesthesia   GERD (gastroesophageal reflux disease)    Headache    History of hiatal hernia    History of kidney stones    passed stones-no surgery   IBS (irritable bowel syndrome)    Interstitial cystitis    Osgood-Schlatter's disease    "diagnosed years ago"   Pneumonia 2014   Seizures (HCC)    last one 02/2018 - on new medication    PAST SURGICAL HISTORY: Past Surgical History:  Procedure Laterality Date   APPENDECTOMY     CESAREAN SECTION     x 2   CHOLECYSTECTOMY     COLONOSCOPY     normal-2010   DILATION AND CURETTAGE OF UTERUS  2008   KNEE SURGERY Right    x3   LAPAROSCOPY     endometriosis   lipotripsy     x 1 - kidney stone removal with stent   TUBAL LIGATION     UPPER GASTROINTESTINAL ENDOSCOPY  12/26/2014   Erosive esophagitis (LA grade B). Small hiatal hernoa. Mild gastritis   VAGUS NERVE STIMULATOR INSERTION N/A 07/04/2014   Procedure: VAGAL NERVE STIMULATOR IMPLANT;  Surgeon: Lisbeth Renshaw, MD;  Location: [facility];  Service: Neurosurgery;  Laterality: N/A;   VAGUS NERVE STIMULATOR INSERTION N/A 08/28/2020   Procedure: VAGUS NERVE STIMULATOR BATTERY REPLACEMENT;  Surgeon: Lisbeth Renshaw, MD;  Location: [facility];  Service: Neurosurgery;  Laterality: N/A;    FAMILY HISTORY: Family History  Problem Relation Age of Onset   Cancer Mother    Diabetes Father    Cancer Maternal Grandmother    Cancer Maternal Grandfather    Diabetes Paternal Grandmother    Diabetes Paternal Grandfather    Migraines Neg Hx     Colon cancer Neg Hx    Rectal cancer Neg Hx    Stomach cancer Neg Hx    Esophageal cancer Neg Hx     SOCIAL HISTORY: Social History   Socioeconomic History   Marital status: Married    Spouse name: Ryan   Number of children: 2   Years of education: Some colle   Highest education level: Not on file  Occupational History   Not on file  Tobacco Use   Smoking status: Former    Types: Cigarettes    Quit date: 1999    Years since quitting: 23.8   Smokeless tobacco: Never  Vaping Use   Vaping Use: Never used  Substance and Sexual Activity   Alcohol use: Yes    Comment: social   Drug use: No   Sexual activity: Yes    Birth control/protection: Surgical    Comment: Tubal Ligation  Other Topics Concern   Not on file  Social History Narrative   Lives at home with husband, [name]   Daily caffeine consumption- 1 cup   Social Determinants of Health   Financial Resource Strain: Not on file  Food Insecurity: Not on file  Transportation Needs: Not on file  Physical Activity: Not on file  Stress: Not on file  Social Connections: Not on file  Intimate Partner Violence: Not on file      PHYSICAL EXAM  Vitals:   12/15/20 0823  BP: 102/70  Pulse: 73  Weight: 231 lb (104.8 kg)  Height: 5\' 2"  (1.575 m)   Body mass index is 42.25 kg/m.  Generalized: Well developed, in no acute distress   Neurological examination  Mentation: Alert oriented to time, place, history taking. Follows all commands speech and language fluent Cranial nerve : no  loss of taste or smell.  Pupils were equal round reactive to light. Extraocular movements were full,  visual field were full on confrontational test.  Head turning and shoulder shrug  were normal and symmetric. Motor: full strength of all 4 extremities and grip with symmetric motor tone is noted throughout.  Sensory: intact to vibration and soft touch No evidence of extinction is noted.  Coordination: reveals good-nose-finger bilaterally.   Gait and station: Gait is intact.  Reflexes: Deep tendon reflexes are symmetric and normal bilaterally.   DIAGNOSTIC DATA (LABS, IMAGING, TESTING) - I reviewed patient records, labs, notes, testing and imaging myself where available.  Lab Results  Component Value Date   WBC 6.4 08/26/2020   HGB 14.3 08/26/2020   HCT 43.3 08/26/2020   MCV 93.1 08/26/2020   PLT 321 08/26/2020      Component Value Date/Time   NA 144 07/15/2020 0917   K 4.3 07/15/2020 0917   CL 108 (H) 07/15/2020 0917   CO2 20 07/15/2020 0917   GLUCOSE 92 07/15/2020 0917   GLUCOSE 97 03/14/2018 2138   BUN 16 07/15/2020 0917   CREATININE 0.83 07/15/2020 0917   CALCIUM 9.4 07/15/2020 0917   PROT 7.0 07/15/2020 0917   ALBUMIN 4.5 07/15/2020 0917   AST 15 07/15/2020 0917   ALT 16 07/15/2020 0917   ALKPHOS 91 07/15/2020 0917   BILITOT 0.3 07/15/2020 0917   GFRNONAA 82 07/09/2019 0901   GFRAA 95 07/09/2019 0901    Lab Results  Component Value Date   TSH 1.260 05/08/2014    ASSESSMENT AND PLAN 45 y.o. year old female  has a past medical history of GERD (gastroesophageal reflux disease), Headache,  History of kidney stones, IBS (irritable bowel syndrome), Interstitial cystitis, Depression and Seizures (HCC). here with:  1.  Catamenial Seizures on VNS -   3 settings or more were adjusted today: [id number]  I spent 20 minutes of face-to-face and non-face-to-face time with patient.  This included previsit chart review, lab review, study review, order entry, electronic health record documentation, patient education.  Rv in 6 month form now.   Melvyn Novas, MD   12/15/2020, 8:41 AM [facility] [address], [address] [place], [place] [address] [phone]

## 2020-12-15 NOTE — Addendum Note (Signed)
Addended by: Melvyn Novas on: 12/15/2020 08:59 AM   Modules accepted: Orders

## 2021-02-23 ENCOUNTER — Other Ambulatory Visit: Payer: Self-pay | Admitting: Gastroenterology

## 2021-05-21 ENCOUNTER — Other Ambulatory Visit: Payer: Self-pay | Admitting: Gastroenterology

## 2021-06-15 ENCOUNTER — Encounter: Payer: Self-pay | Admitting: Neurology

## 2021-06-15 ENCOUNTER — Ambulatory Visit: Payer: Medicare Other | Admitting: Neurology

## 2021-06-15 DIAGNOSIS — G40B09 Juvenile myoclonic epilepsy, not intractable, without status epilepticus: Secondary | ICD-10-CM

## 2021-06-15 DIAGNOSIS — Z462 Encounter for fitting and adjustment of other devices related to nervous system and special senses: Secondary | ICD-10-CM

## 2021-06-15 DIAGNOSIS — Z9689 Presence of other specified functional implants: Secondary | ICD-10-CM | POA: Diagnosis not present

## 2021-06-15 DIAGNOSIS — G40B19 Juvenile myoclonic epilepsy, intractable, without status epilepticus: Secondary | ICD-10-CM

## 2021-06-15 DIAGNOSIS — G43701 Chronic migraine without aura, not intractable, with status migrainosus: Secondary | ICD-10-CM

## 2021-06-15 MED ORDER — LAMOTRIGINE 150 MG PO TABS: 150.0000 mg | ORAL_TABLET | Freq: Two times a day (BID) | ORAL | 3 refills | Status: DC

## 2021-06-15 MED ORDER — PROMETHAZINE HCL 25 MG PO TABS: 25.0000 mg | ORAL_TABLET | Freq: Three times a day (TID) | ORAL | 5 refills | Status: DC | PRN

## 2021-06-15 NOTE — Patient Instructions (Signed)
[id number] ?

## 2021-06-15 NOTE — Progress Notes (Signed)
? ? ?PATIENT: Traci [name] ?DOB: 10/08/1975 ? ?REASON FOR VISIT: follow up q 6 month.  ?HISTORY FROM: patient  ?INTERVAL HISTORY :One seizure in 04-27-2021, none in March JME patient  ? ? ? ?06-15-2021:  ?No seizures in 1.5 months- none last month but some headaches , we discussed reduction in tachycardia detection- from 30 to 20 %. The threshold is reset every 5 minutes and averaged- should not affect exercise capability.  ?She swipes magnet, battery power is between 75 and 100%. We did this one adjustment today . [id number].  ? ? ? 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 New Battery was placed in July 2022.   ? ?25.22 ohms lead impedence.  ? ?She Continues to swipe magnet for VNS: Now uses magnet every day and has had the " expected " menstrual cycle related seizures , but short,  ? Prior to and during her menstrual cycle- I had changed tachycardia detection back to 40% change in heart rate- allowing for exercise induced tachycardia. She feels not that there have been any stimulations due to physical activity induced tachycardia. No false positives. The new battery and new VNS stimulation detection allows to get the more sensitive stimulation , we can drop to 30 % today.  ? ?I had increased pulse width on normal settings to 500 micro-seconds, from 250 micro sec.  We left that setting in place.  ? ?We increased a output current by 0.125 mA to Magnet ( 0.875 mA) and normal mode ( now 1.625), autostimulation at 1.75 m A.  ? ?CPT code is 95977 ? ? ?- ?07-15-2020: CD, patient reports an incident , [place] in late [place] 2022[place] Did write down the scores, and suddenly was on the floor, no aura, lasted 1-2 minutes and not a long postictum- 5-8 minutes to re-orientate herself. ?Not related to menstrual cycle.  The first seizure in a long time.  ?I could hear his current settings, the output is set to 1.5 mA  outer stimulation to 1.625 mA and the magnet strength to 1.75 mA the frequency is 20 Hz.  Pulse width is 500 ?s for the auto stimulation 20 to 50 ?s for the magnet 250 ?s.  On time 30 seconds for the normal baseline auto stimulation 60 seconds Magnet 60 seconds.  Of time is 3 minutes duty cycle is 16%.  Battery at this time is over 25%.  Lead impedance is 2516 home.  Output current 1.5 mA escorted above.  There were no magnet stimulations today.  Auto stim events 20.47/day. ?We discussed regular magnet use. ?She has been reset to 40 % heart rate increase threshold. all functions were screened and the patient VNS has only 11-255 of battery power. I would like to set her up for replacement in the next 6-8 month.  ? ? ? ? ?04/14/2020 with [facility] [place], adjusted VNS settings.  ? ?01/09/20: CD Here with husband, rm 10. presents today for follow up. She reports  overall been doing well. 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  ? She has been  Working out again, helps mood. Lost 40 pounds.  ?she states average month she usually only has 1 seizure, maybe 2. The month of november she has had 3, but states that she has also had a change in her menstrual cycle so she feels they may be hormonal. VNS interrogation. ?Last seizure described at 20-30 sec  duration , just this week. Changes in menstrual cycle- usually very regularly, it was 21 days late and now it is only 2 days long. No spotting. More headaches. More depression-  ?Lamictal as raised last visit to 150 mg bid by Butch Penny, NP and continues Topamax 50 mg bid. VNS patient.  ?Here for interrogation.  The venous stimulator was implanted on 03 Jul 2014 is a expected battery life of a decade.  Her current settings are output current of 1.5 mA signal frequency of 20 Hz pulse width of 250 ?s signal on time duration is 30 seconds and signal of time is 3 minutes between signals. ?  The measurement has remained at an output current of 1.75 mA pulse width of 250 signal  on time of 60 seconds.  Also stimulation is set to an output current of 1.6 to 5 mA pulse width of 250 ?s signal on time of 1 minute.  And tachycardia detection is 1 with a heart rate sensitivity level of 3 threshold for auto stimulation is a change in heart rate by 40%. ? ? ?  ? ?Traci [name] is a 46 year old female with a history of intractable seizures.  She returns today for follow-up.  She is currently on Lamictal 100 mg in the morning and 150 in the evening.  She remains on Topamax 50 mg twice a day.  She also has a vagal nerve stimulator.  Reports that she swiped her magnet 3 times a day.  She continues to have seizures around her menstrual cycle.  On average she has approximately 1-2 seizures a month.  Reports that the most she has had is four.  She does not operate a motor vehicle.  She returns today for an evaluation ? ?HISTORY 01/08/19: ?  ?Traci [name] is a 46 year old female with a history of seizures.  She returns today for follow-up.  She is currently on Lamictal 100 mg in the morning and 150 mg in the evening.  She is also on Topamax 50 mg twice a day.  She also has a vagal nerve stimulator.  She reports that she always has seizures around her menstrual cycle.  She states that typically 7 days before she will start feeling different.  ? And then she may have 3 days during her cycle that she has events.  These are typically grand mal seizures.  She states the length of the seizure can vary from 30 secs to 2 min. She reports that she is tolerating her medication well.  She does not operate a motor vehicle.  She is able to complete all ADLs independently.  She returns today for an evaluation ? ? ?REVIEW OF SYSTEMS: Out of a complete 14 system review of symptoms, the patient complains only of the following symptoms, and all other reviewed systems are negative. ? ?See HPI-  ? ?ALLERGIES: ?No Known Allergies ? ? ? ?HOME MEDICATIONS: ?Outpatient Medications Prior to Visit  ?Medication Sig Dispense Refill  ?  lamoTRIgine (LAMICTAL) 150 MG tablet Take 1 tablet (150 mg total) by mouth 2 (two) times daily. 180 tablet 3  ? pantoprazole (PROTONIX) 40 MG tablet TAKE 1 TABLET(40 MG) BY MOUTH TWICE DAILY BEFORE A MEAL. Please call [phone] for an office visit for more refills 60 tablet 0  ? promethazine (PHENERGAN) 25 MG tablet Take 1 tablet (25 mg total) by mouth every 8 (eight) hours as needed for nausea or vomiting. 30 tablet 5  ? topiramate (TOPAMAX) 100 MG tablet Take 0.5 tablets (50  mg total) by mouth in the morning and at bedtime. 180 tablet 3  ? ?No facility-administered medications prior to visit.  ? ? ?PAST MEDICAL HISTORY: ?Past Medical History:  ?Diagnosis Date  ? COVID 2021  ? also had in 2020  ? Family history of adverse reaction to anesthesia   ? Father had nausea post anesthesia  ? GERD (gastroesophageal reflux disease)   ? Headache   ? History of hiatal hernia   ? History of kidney stones   ? passed stones-no surgery  ? IBS (irritable bowel syndrome)   ? Interstitial cystitis   ? Osgood-Schlatter's disease   ? "diagnosed years ago"  ? Pneumonia 2014  ? Seizures (HCC)   ? last one 02/2018 - on new medication  ? ? ?PAST SURGICAL HISTORY: ?Past Surgical History:  ?Procedure Laterality Date  ? APPENDECTOMY    ? CESAREAN SECTION    ? x 2  ? CHOLECYSTECTOMY    ? COLONOSCOPY    ? normal-2010  ? DILATION AND CURETTAGE OF UTERUS  2008  ? KNEE SURGERY Right   ? x3  ? LAPAROSCOPY    ? endometriosis  ? lipotripsy    ? x 1 - kidney stone removal with stent  ? TUBAL LIGATION    ? UPPER GASTROINTESTINAL ENDOSCOPY  12/26/2014  ? Erosive esophagitis (LA grade B). Small hiatal hernoa. Mild gastritis  ? VAGUS NERVE STIMULATOR INSERTION N/A 07/04/2014  ? Procedure: VAGAL NERVE STIMULATOR IMPLANT;  Surgeon: Lisbeth Renshaw, MD;  Location: [facility];  Service: Neurosurgery;  Laterality: N/A;  ? VAGUS NERVE STIMULATOR INSERTION N/A 08/28/2020  ? Procedure: VAGUS NERVE STIMULATOR BATTERY REPLACEMENT;  Surgeon: Lisbeth Renshaw,  MD;  Location: [facility];  Service: Neurosurgery;  Laterality: N/A;  ? ? ?FAMILY HISTORY: ?Family History  ?Problem Relation Age of Onset  ? Cancer Mother   ? Diabetes Father   ? Cancer Maternal Grandmother   ? Ca

## 2021-06-15 NOTE — Progress Notes (Signed)
?  Report  ? ?Re ? ?Report with changes:  ? ? ?

## 2021-06-21 ENCOUNTER — Other Ambulatory Visit: Payer: Self-pay | Admitting: Gastroenterology

## 2021-07-19 ENCOUNTER — Other Ambulatory Visit: Payer: Self-pay | Admitting: Gastroenterology

## 2021-08-13 ENCOUNTER — Telehealth: Payer: Self-pay | Admitting: Gastroenterology

## 2021-08-13 MED ORDER — PANTOPRAZOLE SODIUM 40 MG PO TBEC: 40.0000 mg | DELAYED_RELEASE_TABLET | Freq: Two times a day (BID) | ORAL | 1 refills | Status: DC

## 2021-08-13 NOTE — Telephone Encounter (Signed)
Protonix sent in and pt is aware

## 2021-08-14 ENCOUNTER — Other Ambulatory Visit: Payer: Self-pay | Admitting: Neurology

## 2021-08-29 ENCOUNTER — Inpatient Hospital Stay
Admission: AD | Admit: 2021-08-29 | Discharge: 2021-08-30 | DRG: 082 | Disposition: A | Discharge status: Home / Self Care | Payer: Medicaid (Managed Care) | Attending: Neurology | Admitting: Neurology

## 2021-08-29 ENCOUNTER — Emergency Department: Payer: Medicaid (Managed Care)

## 2021-08-29 ENCOUNTER — Emergency Department (EMERGENCY_DEPARTMENT_HOSPITAL): Payer: Medicaid (Managed Care)

## 2021-08-29 DIAGNOSIS — H547 Unspecified visual loss: Secondary | ICD-10-CM

## 2021-08-29 DIAGNOSIS — Z20822 Contact with and (suspected) exposure to covid-19: Secondary | ICD-10-CM | POA: Diagnosis present

## 2021-08-29 DIAGNOSIS — J45909 Unspecified asthma, uncomplicated: Secondary | ICD-10-CM | POA: Diagnosis present

## 2021-08-29 DIAGNOSIS — G40909 Epilepsy, unspecified, not intractable, without status epilepticus: Secondary | ICD-10-CM

## 2021-08-29 DIAGNOSIS — Z720 Tobacco use: Secondary | ICD-10-CM

## 2021-08-29 DIAGNOSIS — R2990 Unspecified symptoms and signs involving the nervous system: Secondary | ICD-10-CM

## 2021-08-29 DIAGNOSIS — Z7982 Long term (current) use of aspirin: Secondary | ICD-10-CM

## 2021-08-29 DIAGNOSIS — F17210 Nicotine dependence, cigarettes, uncomplicated: Secondary | ICD-10-CM | POA: Diagnosis present

## 2021-08-29 DIAGNOSIS — H3411 Central retinal artery occlusion, right eye: Principal | ICD-10-CM | POA: Diagnosis present

## 2021-08-29 DIAGNOSIS — H5461 Unqualified visual loss, right eye, normal vision left eye: Secondary | ICD-10-CM | POA: Diagnosis present

## 2021-08-29 DIAGNOSIS — G43009 Migraine without aura, not intractable, without status migrainosus: Secondary | ICD-10-CM | POA: Diagnosis present

## 2021-08-29 DIAGNOSIS — Z28310 Unvaccinated for covid-19: Secondary | ICD-10-CM

## 2021-08-29 DIAGNOSIS — I10 Essential (primary) hypertension: Secondary | ICD-10-CM | POA: Diagnosis present

## 2021-08-29 DIAGNOSIS — R9431 Abnormal electrocardiogram [ECG] [EKG]: Secondary | ICD-10-CM

## 2021-08-29 DIAGNOSIS — R519 Headache, unspecified: Secondary | ICD-10-CM

## 2021-08-29 DIAGNOSIS — Z79899 Other long term (current) drug therapy: Secondary | ICD-10-CM

## 2021-08-29 DIAGNOSIS — F1510 Other stimulant abuse, uncomplicated: Secondary | ICD-10-CM | POA: Diagnosis present

## 2021-08-29 LAB — CBC WITH DIFFERENTIAL
Basophils % Auto: 1 %
Basophils Abs Auto: 0.1 10*3/uL (ref 0.0–0.2)
Eosinophils % Auto: 1 %
Eosinophils Abs Auto: 0.1 10*3/uL (ref 0.0–0.5)
Hematocrit: 42.5 % (ref 34.0–46.0)
Hemoglobin: 14.4 g/dL (ref 12.0–16.0)
Lymphocytes % Auto: 25 %
Lymphocytes Abs Auto: 2.2 10*3/uL (ref 1.0–4.8)
MCH: 29.8 pg (ref 27.0–33.0)
MCHC: 33.8 % (ref 32.0–36.0)
MCV: 88.2 fL (ref 80.0–100.0)
MPV: 8.6 fL (ref 6.8–10.0)
Monocytes % Auto: 7 %
Monocytes Abs Auto: 0.6 10*3/uL (ref 0.1–0.8)
Neutrophils % Auto: 66 %
Neutrophils Abs Auto: 5.7 10*3/uL (ref 1.8–7.7)
Platelet Count: 252 10*3/uL (ref 130–400)
RDW: 13.9 % (ref 0.0–14.7)
Red Blood Cell Count: 4.82 10*6/uL (ref 3.70–5.50)
White Blood Cell Count: 8.6 10*3/uL (ref 4.5–11.0)

## 2021-08-29 LAB — BASIC METABOLIC PANEL
Anion Gap: 11 mmol/L (ref 7–15)
Calcium: 9 mg/dL (ref 8.6–10.0)
Carbon Dioxide Total: 22 mmol/L (ref 22–29)
Chloride: 103 mmol/L (ref 98–107)
Creatinine Serum: 0.86 mg/dL (ref 0.51–1.17)
E-GFR Creatinine (Female): 86 mL/min/{1.73_m2}
Glucose: 86 mg/dL (ref 74–109)
Potassium: 3.8 mmol/L (ref 3.4–5.1)
Sodium: 136 mmol/L (ref 136–145)
Urea Nitrogen, Blood (BUN): 10 mg/dL (ref 6–20)

## 2021-08-29 LAB — TROPONIN T: TROPONIN T: 9 ng/L (ref <=19)

## 2021-08-29 LAB — POC SARS-COV-2: POC SARS-COV-2: NOT DETECTED

## 2021-08-29 LAB — INR
INR: 1.07 (ref 0.87–1.18)
Prothrombin Time: 9.8 secs (ref 8.4–10.7)

## 2021-08-29 LAB — APTT STUDIES: aPTT: 31.3 secs (ref 28.8–39.9)

## 2021-08-29 LAB — ETHANOL, PLASMA: Ethanol, Plasma: 13 mg/dL — ABNORMAL HIGH (ref <10)

## 2021-08-29 LAB — POC GLUCOSE: POC GLUCOSE: 96 mg/dL (ref 70–99)

## 2021-08-29 MED ORDER — ASPIRIN 325 MG TABLET
325.0000 mg | ORAL_TABLET | Freq: Once | ORAL | Status: AC
Start: 2021-08-29 — End: 2021-08-29
  Administered 2021-08-29: 325 mg via ORAL
  Filled 2021-08-29: qty 1

## 2021-08-29 MED ORDER — IOHEXOL 350 MG IODINE/ML INTRAVENOUS SOLUTION - 500 ML BOTTLE
INTRAVENOUS | Status: AC
Start: 2021-08-29 — End: 2021-08-29

## 2021-08-29 MED ORDER — ASPIRIN 81 MG CHEWABLE TABLET
81.0000 mg | CHEWABLE_TABLET | Freq: Every day | ORAL | Status: DC
Start: 2021-08-30 — End: 2021-08-30
  Administered 2021-08-30: 81 mg via ORAL
  Filled 2021-08-29: qty 1

## 2021-08-29 NOTE — ED Provider Notes (Signed)
EMERGENCY DEPARTMENT PHYSICIAN NOTE - Kennley Schwandt    Notes will be shared by default.  If this note meets one of the EXCEPTIONS to note sharing, deselect "Share with Patient" at the top of the note and choose a reason in the popup:[id number]   Date of Service: 08/29/2021  9:37 PM Patient's PCP: No primary care provider on file.   Note Started: 08/29/2021 21:38 DOB: 10/11/1975      Chief Complaint   Patient presents with    *944:Medical      Triage  Orders  Workup  Results Grouped  Results Report  ECGs  Micro  VS/Meds   RN Notes  Chart Review   Dispo:18486  The history provided by the patient.       Charlotte [name] is a 46yr old female, who has an unknown past medical history, presenting to the ED with sudden onset vision loss in her right eye at 07:00 today. Patient was last seen normal at 01:00 before she went to bed but noticed the vision loss when she woke up this morning. She describes it as a patchy dark curtain. She presented to OSH and received a CT head which was normal and a CT angiogram brain and neck which showed linear density at bilateral carotid bulbs and proximal internal carotid arteries. It was concerning for contrast vs less likely carotid dissections. No evidence of aneurism.    Patient has a history of asthma.        HISTORY: Enter/Edit History:18486  No past medical history on file. Not on File   No past surgical history on file. No current outpatient medications on file.      Social History     Social History Narrative    Not on file    No family history on file.     TRIAGE VITAL SIGNS:  Temp: (not recorded)  Temp src: (not recorded)  Pulse: (not recorded)  BP: (not recorded)  Resp: (not recorded)  SpO2: (not recorded)  Weight: (not recorded)    Physical Exam  Vitals and nursing note reviewed.   Constitutional:       Appearance: Normal appearance.   HENT:      Head: Normocephalic and atraumatic.      Right Ear: External ear normal.      Left Ear: External ear normal.    Eyes:      General:         Right eye: No discharge.         Left eye: No discharge.      Extraocular Movements: Extraocular movements intact.      Conjunctiva/sclera: Conjunctivae normal.      Comments: Right eye: no vision in the medial upper and lower aspects of visual field. Blurred vision to shapes and shadows in the lateral upper and lower aspect of visual field.  No nystagmus  EOM intact   Abdominal:      General: Abdomen is flat. There is no distension.   Musculoskeletal:         General: Normal range of motion.      Cervical back: Normal range of motion and neck supple.   Skin:     General: Skin is warm and dry.      Coloration: Skin is not jaundiced.   Neurological:      Mental Status: She is alert and oriented to person, place, and time.      Sensory: No sensory deficit.      Motor:  No weakness.      Comments: Grip strength intact  No upper extremity drift  No pronator drift  5/5 strength of bilateral upper and lower extremities  Cranial nerves 3-12 intact   Psychiatric:         Mood and Affect: Mood normal.         Behavior: Behavior normal.              MEDICAL DECISION MAKING   Please make sure to include any critical / life-threatening diagnoses considered after history and physical:18486  Differential includes, but is not limited to: ***     The results of the ED evaluation were notable for the following:      Pertinent lab results:  Labs Reviewed   CBC WITH DIFFERENTIAL   BASIC METABOLIC PANEL   TROPONIN T   INR   APTT STUDIES   ETHANOL, PLASMA   URINALYSIS-COMPLETE   UR DRUGS OF ABUSE SCREEN   POC GLUCOSE       Result Value    POC GLUCOSE 96     POC GLUCOSE   POC SARS-COV-2        Describe at [facility] One Finding of at [facility] [id number]  Pertinent imaging results (interpreted independently by me):    RADIOLOGY INTERPRETATIONS BY ED PHYSICIAN:[id number]    Radiology reads:   None  ECG (interpreted independently by me): ***   .EDDATA - not for scribes (Optional):950033       Triage  Orders   Workup  Results Grouped  Results Report  ECGs  Micro  VS/Meds   RN Notes  Chart Review   Dispo:18486  PATIENT SUMMARY:    Why was the patient admitted? Was admission highly considered but the patient was not admitted?  Prescriptions considered but not written?  Radiology studies but no ordered? Decision Support tools used? Please document :18486  Aurielle Slingerland is a 46yr old female ***      Case discussed with the follow service(s):  NEUROLOGY CONSULT  SOCIAL SERVICES/ SOCIAL WORKER CONSULT-ED CRISIS      Brief details of discussion(s):  :31023          DISPOSITION:[id number]    Clinical Impression/Problems Addressed:   Please include any diagnoses/problems addressed.  Please be specific and detailed - use terms such as"severe", "with systemic symptoms", "life-threatening", "respiratory failure", etc, if appropriate :18486  ***     LAST VITAL SIGNS:  Temp: (not recorded)  Temp src: (not recorded)  Pulse: (not recorded)  BP: (not recorded)  Resp: (not recorded)  SpO2: (not recorded)  Weight: (not recorded)    PATIENT'S GENERAL CONDITION:   [facility] PATIENT CONDITION:32407    SCRIBE STATEMENT  I, Marylene Land, SCRIBE,  am personally taking down the notes in the presence of Dr. Merlene Pulling.  Electronically signed by Marylene Land, SCRIBE, Scribe  08/29/2021  21:46     ED RESIDENT/NP/ATTENDING SIGNATURE:[id number]

## 2021-08-29 NOTE — ED Triage Note (Signed)
Pt had normal vision and went to bed 1am today then awoke with r eye vision loss 7a was seen at osh and had ct, poss carotid dissection sent in to [facility] for eval, gcs 15, amb with steady gait, clear speech no facial droop, no weakness, to room for stroke alert

## 2021-08-29 NOTE — Consults (Signed)
 NEUROLOGY RESIDENT STROKE/TIA CONSULT (NON-THROMBOLYTIC)    PATIENT:  Charlotte [name]  MRN:         [id number]    NOTE DATE / TIME:  08/30/2021  @ 05:39    Date / Time of Initial Assessment: 08/29/21 at 2138    CHIEF COMPLAINT:  R eye vision loss     HISTORY SOURCE:  patient     Keshawn Sundberg is a 46yr year old female who arrived at 08/29/2021  9:37 PM for Other vision loss .  she was last known to be at her baseline on 08/29/21 at 0100 and was noted to have symptoms on 08/29/21 at 0700 while she was at home.  Neurology was consulted to see the patient due to concern of stroke.    This is a 46 year old with a history of migraine without aura, asthma, and HTN (not on medications) who presents with vision loss in the right eye. She states that she went to bed at around 1am last night feeling well. When she woke up at 7 am this morning, she noticed she could not see out of the right eye. No pain with eye movements. She thinks she can see better out of the periphery of her vision, but the center of her vision is dark. There was a moment when she felt like her vision came back, but then it went away again, and this time she thought she noticed a curtain fall over her eye. There have not been any fluctuations in her vision since. She did go to an outside hospital where they performed [facility] and CTA head and neck, but we unfortunately do not have ability to access these images. They asked her to come to [facility] because there was question of possible carotid artery dissection vs contrast technique issue, and they had recommended further evaluation with carotid ultrasound. She was not evaluated by ophthalmology at the OSH.     Pt does not take any antiplatelet agents or anticoagulation. She says she used to take medication for HTN but does not currently take any. She has migraines and did have a headache yesterday, but her migraines  never present with vision loss. She currently doesn't have a headach.     REVIEW OF  SYSTEMS:  ROS was performed and is negative except for those items noted in the HPI and any additional items noted here:  negative for headache, pain at the temple     PAST MEDICAL & SURGICAL HISTORY:  No past medical history on file.  No past surgical history on file.  Asthma  HTN       MEDICATIONS, OUTPATIENT:  None       ALLERGIES:  No Known Allergies    FAMILY HISTORY:  No family history on file.    Migraines in multiple family members     SOCIAL HISTORY:   T/E/D:  active tobacco use   Occupation:     Lives with:       EXAMINATION:    Weight: Weight: 96.2 kg (212 lb 1.3 oz) (08/30/21 0300)     BMI: Body mass index is 34.23 kg/m.   Morbid obesity (BMI > 40) or underweight (BMI < 19)? No    Vitals:     Current  Minimum Maximum   BP BP: (!) 147/92  BP: (144-180)/(82-127)    Temp Temp: 36 C (96.8 F)  Temp Min: 36 C (96.8 F)  Temp Max: 36.7 C (98 F)  Pulse Pulse: 80 Pulse Min: 74  Pulse Max: 87    Resp Resp: 16 Resp Min: 16  Resp Max: 20    O2 Sat SpO2: 97 % SpO2 Min: 96 % SpO2 Max: 99 %   O2 Deliv None ; No Data Recorded      SpO2: 97 %  Pulse: 80    GCS  Best Eye Response: 4-->(E4) spontaneous, Best Verbal Response: 5-->(V5) oriented, Best Motor Response: 6-->(M6) obeys commands, Glasgow Coma Scale Score: 15    NIH Stroke Scale:  1a. Level of Consciousness: 0-->Alert, keenly responsive  1b. LOC Questions: 0-->Answers both questions correctly  1c. LOC Commands: 0-->Performs both tasks correctly  2. Best Gaze: 0-->Normal  3. Visual: 1-->Partial hemianopia  4. Facial Palsy: 0-->Normal symmetrical movements  5a. Motor Arm, Left: 0-->No drift, limb holds 90 (or 45) degrees for full 10 secs  5b. Motor Arm, Right: 0-->No drift, limb holds 90 (or 45) degrees for full 10 secs  6a. Motor Leg, Left: 0-->No drift, leg holds 30 degree position for full 5 secs  6b. Motor Leg, Right: 0-->No drift, leg holds 30 degree position for full 5 secs  7. Limb Ataxia: 0-->Absent  8. Sensory: 0-->Normal, no sensory loss  9. Best  Language: 0-->No aphasia, normal  10. Dysarthria: 0-->Normal  11. Extinction and Inattention (formerly Neglect): 0-->No abnormality  Total (NIH Stroke Scale): 1    Physical Exam:     General Physical Exam:  General: Alert and interactive  HEENT:   Normocephalic  Cardiovascular:  Extremities warm and well perfused  Respiratory:  Normal respiratory effort on RA      Neuro Exam:  Mental Status:   State:  Awake, alert, oriented to person, place, time and context    Use of language:  Fluent, without paraphasic errors, naming intact    Cranial Nerves:  CN 2:        Pupils equal and reactive to light.   Visual Fields: In tact in Left eye, In Right eye, VF cut in the superior and inferior nasal fields   VA:  OS: 20/40   OD: 20/200  (when visual acuity card is held at the periphery of her vision)   Reduced BTT on the left   CN 3,4,6:  EOM intact without nystagmus.   CN 5:        Sensation intact to LT in bilateral V1-V3.     CN 7:        Symmetric face at rest and with activation.  CN 8:        Hearing intact to conversation.  CN 9, 10:  Palate elevation symmetric.    CN 11:      Trapezius strength 5/5.  CN 12:      Tongue protrusion midline.     Motor:  Tone and bulk: Normal bulk and tone       Pronator drift: None       Deltoid Biceps Triceps BR Wrist Flexors Wrist Extensors Finger Abductors Finger Extensors   R 5 5 5  - - 5 5 5    L 5 5 5  - - 5  5  5         Hip Flexors Hip Ext Leg Abduct Leg Adduct Knee Flexors Knee Ext Dorsi- Flexion Plantar- Flexion   R 5 - - - 5 5 5      L 5 - - - 5 5 5  Sensory:  Light touch: Intact, symmetric over forearms and legs       Coordination:  F-to-N:  Intact      H-to-S: Intact    Cortical  Extinction on DSS: None     Gait - Normal       QUALITY ASSURANCE:  Time Neurology Paged:  9:34 pm   Time Neurology Available in the ED: 9:38 pm         (REQ) PATIENT LAST KNOWN WELL: Date/Time  Last Date Known Well: 08/29/21  Last Time Known Well: 0100  Date Symptoms First Noticed:  08/29/21  Time Symptoms First Noticed: 0700  Location When CVA Symptoms Started: at home  Cerebrovascular Risk Factors: Ischemic Stroke  Current Antiplatelet Use: No  Current Anticoagulant Use: No  Current Cholesterol Reducing Medication Use: No  REQ: Modified Rankin Scale Score Pre-Stroke: 0-No symptoms at all  Glasgow Coma Scale Score: 15  Total (NIH Stroke Scale): 1    Diagnostic studies:  Diagnostic Exams Ordered: CTA Head & Neck  Date/Time Imaging Initially Reviewed (by Neurology):  08/29/21 at 2151  Imaging Results: CTH without clear hypo or hyerdensity. CTA head and neck without evidence of LCO. CT perfusion negative       Did the patient receive a thrombolytic (either at [facility] or another facility)? No  Reason not administered 0-3 Hour: Time last known normal outside the tPA/TNK window       Was NIR Indicated and Interventional Neuroradiology Consulted for evaluation of possible thrombectomy? No  Date / Time IR Noted:   at      Did patient undergo Interventional Neuroradiology procedure (Evaluation for thrombectomy or intraarterial therapy including IA tPA)?    Reason: No evidence of proximal occlusion;NIHSS <6                CURRENT COMORBID CONDITIONS:  Brain Compression: No  Obstructive Hydrocephalus: No  Glasgow Coma Scale Less Than 8: No  TIA: No  Cerebral Edema: No  Encephalopathy: No  Encephalitis: No  Seizure: No  Respiratory Failure: No  Coagulopathy: No  Arrhythmia: No  Hypotension (SBP < 90): No  Fluid and Electrolyte Disorders: Yes  Anoxic Brain Damage: No      DIAGNOSTIC STUDIES:    EKG:  pending     (RETIRED SPR19) (Retired) POC Glucose, blood: --    Lab Results - 24 hours (excluding micro and POC)   POC GLUCOSE ONCE     Status: None   Result Value Status    POC GLUCOSE 96 Final   CBC with Differential     Status: None   Result Value Status    White Blood Cell Count 8.6 Final    Red Blood Cell Count 4.82 Final    Hemoglobin 14.4 Final    Hematocrit 42.5 Final    MCV 88.2 Final    MCH 29.8  Final    MCHC 33.8 Final    RDW 13.9 Final    MPV 8.6 Final    Platelet Count 252 Final    Neutrophils % Auto 66.0 Final    Lymphocytes % Auto 25.0 Final    Monocytes % Auto 7.0 Final    Eosinophils % Auto 1.0 Final    Basophils % Auto 1.0 Final    Neutrophils Abs Auto 5.7 Final    Lymphocytes Abs Auto 2.2 Final    Monocytes Abs Auto 0.6 Final    Eosinophils Abs Auto 0.1 Final    Basophils Abs Auto 0.1 Final  Basic Metabolic Panel     Status: None   Result Value Status    Sodium 136 Final    Potassium 3.8 Final    Chloride 103 Final    Carbon Dioxide Total 22 Final    Anion Gap 11 Final    Urea Nitrogen, Blood (BUN) 10 Final    Creatinine Serum 0.86 Final    Glucose 86 Final    Calcium 9.0 Final    E-GFR Creatinine (Female) 86 Final   Troponin T     Status: Normal   Result Value Status    TROPONIN T 9 Final   INR     Status: Normal   Result Value Status    Prothrombin Time 9.8 Final    INR 1.07 Final   aPTT Studies     Status: Normal   Result Value Status    aPTT 31.3 Final   Ethanol, Plasma     Status: Abnormal   Result Value Status    Ethanol, Plasma 13 (H) Final   Lipid Panel     Status: Normal   Result Value Status    Cholesterol 148 Final    HDL Cholesterol 47 Final    LDL Cholesterol Calculation 74 Final    Total Cholesterol: HDL Ratio 3.1 Final    Triglyceride 137 Final    Non-HDL Cholesterol 101 Final   LDL Cholesterol (Direct)     Status: Normal   Result Value Status    LDL Cholesterol (Direct) 80 Final   Beta HCG Qual, Pregnancy     Status: Normal   Result Value Status    Beta HCG Serum, Qual Negative Final   INR     Status: Normal   Result Value Status    Prothrombin Time 9.9 Final    INR 1.08 Final   aPTT Studies     Status: Normal   Result Value Status    aPTT 32.0 Final   CBC with Differential     Status: Abnormal   Result Value Status    White Blood Cell Count 8.4 Final    Red Blood Cell Count 4.92 Final    Hemoglobin 14.5 Final    Hematocrit 43.5 Final    MCV 88.6 Final    MCH 29.5 Final    MCHC  33.3 Final    RDW 13.9 Final    MPV 8.4 Final    Platelet Count 237 Final    Polys (Segs) % 67.3 Final    Lymphocytes % 25.2 Final    Monocytes % 5.6 Final    Eosinophils % 1.9 Final    Neutrophil Abs 5.7 Final    Lymphocytes Abs 2.1 Final    Monocytes Abs 0.5 (H) Final    Eosinophils Abs 0.2 Final    WBC Morphology Toxic Vacuoles Seen Final    RBC- Color, Size, Shape Normal Final    Platelet Estimate, Smear Adequate Final   Calcium     Status: Normal   Result Value Status    Calcium 8.9 Final       Lab Results   Lab Name Value Date/Time    INR 1.08 08/30/2021 01:47 AM    APTT 32.0 08/30/2021 01:47 AM    CHOL 148 08/29/2021 10:14 PM    LDLC 74 08/29/2021 10:14 PM    HDL 47 08/29/2021 10:14 PM    TRIG 137 08/29/2021 10:14 PM     Exam: CTA Head With Contrast, Arteriography      IMPRESSION:  1.   No evidence for large vessel occlusion.   2.   No evidence for stenosis, dissection, aneurysm or vascular malformation.        IMPRESSION:   No acute infarct or ischemia.     CTA Neck   IMPRESSION:   No stenosis or occlusion.       SUMMARY & IMPRESSION:       Jissel Slavens is a 46 year old woman with history of migraines, untreated HTN, active tobacco use, and asthma who presents with central R eye painless vision loss. Exam notable for elevated blood pressures (SBP 180s), central> peripheral right eye vision loss. CTH did not reveal a bleed or LVO. Carotid arteries appear patent both extra and intracranially. She is pending additional imaging with MRI brain and orbits. Given exam, risk factors, and clinical picture, highest suspicion for retinal artery occlusion. Other etiologies considered include temporal arteritis (though no headache/temporal pain), optic neuritis, retinal detachment, retinal hemorrhage, migraine, CRVO.  Patient's known risk factors for stroke are tobacco use and untreated hypertension. She will be admitted for stroke workup, Given NIHSS of 1, will initiate DAPT for 21 days followed by aspirin   monotherapy.     Plan  # R Monocular vision loss, c/f CRAO   Etiology: suspect embolic   Diagnostic:  - Fingerstick glucose  - CBC, CMP, PT/INR, troponin, ethanol level, Utox, HgbA1c, lipid panel  - EKG  - Telemetry monitoring  - TTE with bubble  - MRI Brain and orbits  - Appreciate ophthamology evaluation     Therapeutic  - Fluids: NS if hypovolemic  - Maintain serum glucose 140-180  - Tylenol  to maintain normothermia  - Blood pressure control   -  goal is < 220/120 for first day   - Antiplatelet   - -S/p  Aspirin  325 mg   - Plavix  75 mg + ASA 81 mg daily  for 21 days followed by ASA monotherapy  - High intensity statin with goal LDL < 70  - Risk factor modifications: smoking cessation   - PT/OT/ST         #HTN    -SBP < 220 for first 24 hours   - OP SBP goal < 130     #Tobacco use   -Nicotine  replacement therapy offered     The patient was not a candidate for IV thrombolytic for the following reasons:  out of window      Patient was discussed with Attending Dr. Elias.     Report Electronically Signed By:  Asberry Pinion, MD (Resident)  Department of Neurology          This patient was seen, evaluated, and care plan was developed with the resident.  I agree with the assessment and plan as outlined in the resident's note with these additions and clarifications: The overnight events are noted in history noted.  Additional history obtained from the patient of which she reports when she had woken yesterday morning the vision from her right eye only remained/was abnormal.  She had had a typical migraine type headache the night before without any other accompanying neurological symptoms.  She states that since the onset, her vision has been unchanged.  On neurological exam, overall is normal with exception of when testing right monocular vision, both the superior and inferior nasal quadrants are absent to light and motion as compared to the superior upper and inferior lateral quadrants of her right monocular visual field.   Her ipsilateral carotid shows no significant flow-limiting  stenosis and the ipsilateral abdominal artery is visualized.  Although the echocardiogram shows no shunt, and if able, we will try to perform a transcranial Doppler emboli detection and shunt study while hospitalized.  I suspect the most likely contributing factor is her many years of tobacco use along with suspected underlying hypertension-which are known potential risk for developing branch retinal artery occlusive disease.  She will need a cardiac Zio patch upon discharge.  A single, antiplatelet agent such as aspirin  would be sufficient.  She was counseled on importance of tobacco cessation.  Can gradually target a normal tensive goal.  She can follow-up with stroke clinic in approximately 3 to 5 months.  Rest as above..  Report electronically signed by Dale VEAR Lango, DO. Attending

## 2021-08-29 NOTE — Consults (Signed)
[x]  Patient was CLEARED for dilation. Dilating drops were instilled in both eyes on 08/29/2021.   []  Nursing staff was notified after dilating drops were given and to place a sign.  []  Patient was NOT CLEARED for dilation at this time. Dilating drops were not given.      Ophthalmology Consultation    Reason for Consult: vision loss right eye  Requesting Attending: 10/30/2021, *  Location of Service: Emergency Department    Identification/Chief Complaint/History of Present Illness:     Charlotte [name] is a 46yr old female with history of uncontrolled hypertension who presents with decreased vision in the right eye.    She woke up this morning with a curtain in the right eye. Now appears to be completely dark. Painless, no photophobia, no redness/discharge. Stroke alert was called although CTA head/neck were unrevealing. No significant ocular history. No flashes or floaters.      Past Ocular History:   None    Ocular Medications:  None    Past Medical History:   Uncontrolled hypertension      Medications:  No current facility-administered medications for this encounter.    Current Outpatient Medications:     Aspirin 81 mg Chewable Tablet, Take 1 tablet by mouth every morning., Disp: 360 tablet, Rfl: 0    Losartan (COZAAR) 25 mg Tablet, Take 1 tablet by mouth every morning., Disp: 30 tablet, Rfl: 3    Nicotine (NICODERM CQ) 14 mg/24 hr Patch, Apply 1 patch to the skin every 24 hours. Indications: stop smoking, Disp: 28 patch, Rfl: 3    Nicotine (NICORETTE) 2 mg Gum, Chew 1-2 pieces by mouth if needed. Indications: stop smoking, Disp: 220 each, Rfl: 11    Allergies:  No Known Allergies    Images:    CTA head and neck 08/29/2021: negative for large vessel occlusion    Ophthalmology Exam:    Base Eye Exam       Visual Acuity (Snellen - Linear)         Right Left    Near sc HM 20/20              Tonometry (Tonopen, 12:16 AM)         Right Left    Pressure 13 14              Pupils         Dark Light Shape  React APD    Right 5 3 Round Slow +4    Left 5 3 Round Brisk None              Visual Fields (Counting fingers)         Right Left      Full    Restrictions Total superior nasal, inferior nasal deficiencies; Partial inner superior temporal, inferior temporal deficiencies               Extraocular Movement         Right Left     Full Full                  Slit Lamp and Fundus Exam       External Exam         Right Left    External Normal Normal              Slit Lamp Exam         Right Left    Lids/Lashes Normal Normal  Conjunctiva/Sclera White and [employer] and Quiet    Cornea Clear Clear    Anterior Chamber Deep and Quiet Deep and Quiet    Iris Round and Reactive Round and Reactive    Lens Clear Clear    Anterior Vitreous Normal Normal              Fundus Exam         Right Left    Disc Normal CDR, No Disc Heme Normal CDR, No Disc Heme    Macula retinal whitening/edema with cherry red spot, small area of sparing nasally with cilioretinal artery, not sparing the fovea Normal, Flat, No Heme    Vessels Arteriolar narrowing, no embolus seen Normal    Periphery Normal, Attached Normal, Attached                    --------------------------------------------------------------------------------------------------------------------------------------    Impression/Plan:  # Central retinal artery occlusion, 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 Please place a referral. We will send a staff message to our scheduling  team.   [phone]   Catalina Gravel. [facility]  [facility]   [address] Ophthalmology Clinic (1st floor)   [place], [place] [address]      Patient was seen and evaluated independently. If admitted, patient will be staffed during the daytime. Please see attending's attestation for any changes in assessment and plan. Thank you for this interesting consult. Please page ophthalmology [phone]) or message the on-call resident with any questions or concerns, or if no longer clear for dilation. If using TigerText or Secure Chat, please check the Admin Notes under 1stCall on On-Call for the appropriate on-call resident.    Was seen with PGY-3 Eulis Foster.    Orlan Leavens, MD  PGY-2 Resident  [facility] Ophthalmology  Pager: [phone]

## 2021-08-29 NOTE — Stroke Coordinator/CNS (Signed)
Stroke Alert: Stroke Coordinator Response Note  Note Started: 08/29/2021  22:35    Stroke Alert paged on 08/29/21 at 2138    Arrived to bedside at 21:41    Recent Labs     08/29/21  2141   PGLU 96       Last Date Known Well: 08/29/2021    Last Time Known Well: 1:00          Patients Weight: Weight: 100.6 kg (221 lb 12.5 oz) (08/29/21 2218)    Stroke Coordinator Summary: Arrived to the hallway outside D6 where patient was being seen and assessed by ED Mds.  Patient provided outside hospital CT and CTA report which was reviewed by ED and NRO MDs.  Patient provided history of current events and then was taken to CT. Pt tolerated CT scan well and ambulated from wheelchair to CT table.  Pt returned to ED for continued care. Per neurology Dr. Eual Fines, the patient Is not a candidate for thrombolytics and Is not a candidate for interventional radiology. Report to Emergency Department, RN

## 2021-08-29 NOTE — Social Work (Signed)
CLINICAL SOCIAL SERVICES - CRISIS TEAM  BRIEF CRITICAL CARE CONSULTATION     Note Date & Time: 08/29/2021    21:42 Admission Date: 08/29/2021  9:37 PM    DOB: Jan 28, 1976 Age: 62yr   Date of Service: 08/29/2021 Referred By: medical team   Patient Name: Scherry Ran Ethnicity: White     Next of Kin/Emergency Contact:   Micheline Rough (boyfriend) at [phone]   Yanin Muhlestein (father) at [phone]    Patient was BIB friend with CC of sudden loss of vision. Crisis social worker responded to the 944 code activation. Patient was alert and responsive. Patient appeared to be calm, coping WNL and able to communicate with the medical team regarding their treatment. Family present at bedside providing pt with appropriate support. No current social work needs identified at present juncture.     DISPO/PLAN: Pt will continue to be evaluated in the ED and undergoing full trauma workup. No further social work needs unless requested.     Signature: Briscoe Burns, LCSW   Licensed Clinical Social Worker  Crisis Team - Emergency Department

## 2021-08-29 NOTE — ED Triage Note (Signed)
Went to bed 1am normal vision awoke 7am today with r eye vision blurry/almost no vision

## 2021-08-30 ENCOUNTER — Other Ambulatory Visit: Payer: Medicaid (Managed Care)

## 2021-08-30 ENCOUNTER — Emergency Department: Payer: Medicaid (Managed Care)

## 2021-08-30 ENCOUNTER — Other Ambulatory Visit: Payer: Self-pay

## 2021-08-30 ENCOUNTER — Inpatient Hospital Stay (HOSPITAL_BASED_OUTPATIENT_CLINIC_OR_DEPARTMENT_OTHER): Payer: Medicaid (Managed Care)

## 2021-08-30 ENCOUNTER — Encounter: Payer: Self-pay | Admitting: Neurology

## 2021-08-30 ENCOUNTER — Ambulatory Visit: Payer: Self-pay

## 2021-08-30 DIAGNOSIS — H3411 Central retinal artery occlusion, right eye: Principal | ICD-10-CM

## 2021-08-30 DIAGNOSIS — I6340 Cerebral infarction due to embolism of unspecified cerebral artery: Secondary | ICD-10-CM

## 2021-08-30 DIAGNOSIS — Z720 Tobacco use: Secondary | ICD-10-CM

## 2021-08-30 DIAGNOSIS — I10 Essential (primary) hypertension: Secondary | ICD-10-CM

## 2021-08-30 DIAGNOSIS — I517 Cardiomegaly: Secondary | ICD-10-CM

## 2021-08-30 DIAGNOSIS — G40909 Epilepsy, unspecified, not intractable, without status epilepticus: Secondary | ICD-10-CM

## 2021-08-30 DIAGNOSIS — F17210 Nicotine dependence, cigarettes, uncomplicated: Secondary | ICD-10-CM

## 2021-08-30 DIAGNOSIS — I639 Cerebral infarction, unspecified: Secondary | ICD-10-CM

## 2021-08-30 LAB — CBC WITH DIFFERENTIAL
Basophils % Auto: 0.9 %
Basophils Abs Auto: 0.1 10*3/uL (ref 0.0–0.2)
Eosinophils % Auto: 2 %
Eosinophils %: 1.9 %
Eosinophils Abs Auto: 0.2 10*3/uL (ref 0.0–0.5)
Eosinophils Abs: 0.2 10*3/uL (ref 0.1–0.3)
Hematocrit: 43.5 % (ref 34.0–46.0)
Hematocrit: 48.4 % — ABNORMAL HIGH (ref 34.0–46.0)
Hemoglobin: 14.5 g/dL (ref 12.0–16.0)
Hemoglobin: 16.2 g/dL — ABNORMAL HIGH (ref 12.0–16.0)
Lymphocytes % Auto: 28 %
Lymphocytes %: 25.2 %
Lymphocytes Abs Auto: 2.4 10*3/uL (ref 1.0–4.8)
Lymphocytes Abs: 2.1 10*3/uL (ref 1.0–4.8)
MCH: 29.5 pg (ref 27.0–33.0)
MCH: 29.8 pg (ref 27.0–33.0)
MCHC: 33.3 % (ref 32.0–36.0)
MCHC: 33.4 % (ref 32.0–36.0)
MCV: 88.6 fL (ref 80.0–100.0)
MCV: 89.2 fL (ref 80.0–100.0)
MPV: 8.4 fL (ref 6.8–10.0)
MPV: 8.7 fL (ref 6.8–10.0)
Monocytes % Auto: 8.8 %
Monocytes %: 5.6 %
Monocytes Abs Auto: 0.7 10*3/uL (ref 0.1–0.8)
Monocytes Abs: 0.5 10*3/uL — ABNORMAL HIGH (ref 0.2–0.4)
Neutrophil Abs: 5.7 10*3/uL (ref 1.8–7.7)
Neutrophils % Auto: 60.3 %
Neutrophils Abs Auto: 5.1 10*3/uL (ref 1.8–7.7)
Platelet Count: 237 10*3/uL (ref 130–400)
Platelet Count: 275 10*3/uL (ref 130–400)
Platelet Estimate, Smear: ADEQUATE
Polys (Segs) %: 67.3 %
RBC- Color, Size, Shape: NORMAL
RDW: 13.9 % (ref 0.0–14.7)
RDW: 13.9 % (ref 0.0–14.7)
Red Blood Cell Count: 4.92 10*6/uL (ref 3.70–5.50)
Red Blood Cell Count: 5.42 10*6/uL (ref 3.70–5.50)
White Blood Cell Count: 8.4 10*3/uL (ref 4.5–11.0)
White Blood Cell Count: 8.4 10*3/uL (ref 4.5–11.0)

## 2021-08-30 LAB — URINALYSIS AND CULTURE IF IND
Bilirubin Urine: NEGATIVE
Glucose Urine: NEGATIVE mg/dL
Nitrite Urine: NEGATIVE
Occult Blood Urine: NEGATIVE mg/dL
Protein Urine: 30 mg/dL — AB
RBC: 4 /HPF — ABNORMAL HIGH (ref 0–2)
Specific Gravity, Urine: 1.05 — ABNORMAL HIGH (ref 1.002–1.030)
Squamous EPI: 3 /HPF (ref <=10)
Urobilinogen: 2 mg/dL (ref ?–2.0)
WBC, Urine: 1 /HPF (ref 0–5)
pH URINE: 6 (ref 4.8–7.8)

## 2021-08-30 LAB — LIPID PANEL
Cholesterol: 148 mg/dL (ref <200)
HDL Cholesterol: 47 mg/dL (ref >40)
LDL Cholesterol Calculation: 74 mg/dL (ref <100)
Non-HDL Cholesterol: 101 mg/dL (ref <=150)
Total Cholesterol: HDL Ratio: 3.1 (ref <4.0)
Triglyceride: 137 mg/dL (ref <150)

## 2021-08-30 LAB — UR DRUGS OF ABUSE SCREEN
Amphetamine Screen, Urine: POSITIVE — AB
Barbiturates Screen, Urine: NEGATIVE
Benzodiazepines Screen, Urine: NEGATIVE
Cocaine Metabolite Scrn, Urine: NEGATIVE
Fentanyl Screen, Urine: NEGATIVE
Opiates Screen, Urine: NEGATIVE

## 2021-08-30 LAB — ECHOCARDIOGRAM COMPLETE WITH BUBBLE STUDY
IVSD 2D: 1.22 cm (ref 0.6–0.9)
LEFT INTERNAL DIMENSION IN SYSTOLE: 3 cm
LEFT VENTRICULAR INTERNAL DIMENSION IN DIASTOLE: 4.5 cm (ref 3.8–5.2)
POSTERIOR WALL: 1.2 cm (ref 0.6–0.9)
TAPSE: 1.75 cm
TV PEAK SYSTOLIC PULMONARY ARTERY PRESSURE: 16.7 mmHg

## 2021-08-30 LAB — INR
INR: 1.08 (ref 0.87–1.18)
Prothrombin Time: 9.9 secs (ref 8.4–10.7)

## 2021-08-30 LAB — HEMOGLOBIN A1C
Hgb A1C,Glucose Est Avg: 117 mg/dL
Hgb A1C: 5.7 % — ABNORMAL HIGH (ref 3.9–5.6)

## 2021-08-30 LAB — C DIFFICILE SURVEILLANCE TEST: Test Result: NEGATIVE

## 2021-08-30 LAB — CALCIUM: Calcium: 8.9 mg/dL (ref 8.6–10.0)

## 2021-08-30 LAB — APTT STUDIES: aPTT: 32 secs (ref 28.8–39.9)

## 2021-08-30 LAB — LDL CHOLESTEROL (DIRECT): LDL Cholesterol (Direct): 80 mg/dL (ref <100)

## 2021-08-30 LAB — BETA HCG QUAL, PREGNANCY: Beta HCG Serum, Qual: NEGATIVE

## 2021-08-30 MED ORDER — NICOTINE (POLACRILEX) 2 MG BUCCAL LOZENGE
1.0000 | LOZENGE | BUCCAL | Status: DC | PRN
Start: 2021-08-30 — End: 2021-08-30
  Filled 2021-08-30: qty 2

## 2021-08-30 MED ORDER — LOSARTAN 50 MG TABLET
25.0000 mg | ORAL_TABLET | Freq: Every day | ORAL | Status: DC
Start: 2021-08-30 — End: 2021-08-30
  Administered 2021-08-30: 25 mg via ORAL
  Filled 2021-08-30: qty 1

## 2021-08-30 MED ORDER — LOSARTAN 25 MG TABLET
25.0000 mg | ORAL_TABLET | Freq: Every day | ORAL | 3 refills | Status: AC
Start: 2021-08-30 — End: 2022-08-25
  Filled 2021-08-30: qty 30, 30d supply, fill #0

## 2021-08-30 MED ORDER — NICOTINE 14 MG/24 HR DAILY TRANSDERMAL PATCH
1.0000 | MEDICATED_PATCH | TRANSDERMAL | Status: DC
Start: 2021-08-30 — End: 2021-08-30

## 2021-08-30 MED ORDER — POLYETHYLENE GLYCOL 3350 17 GRAM ORAL POWDER PACKET
17.0000 g | Freq: Every day | ORAL | Status: DC
Start: 2021-08-30 — End: 2021-08-30

## 2021-08-30 MED ORDER — LISINOPRIL 10 MG TABLET
20.0000 mg | ORAL_TABLET | Freq: Every day | ORAL | Status: DC
Start: 2021-08-30 — End: 2021-08-30

## 2021-08-30 MED ORDER — NICOTINE 14 MG/24 HR DAILY TRANSDERMAL PATCH
1.0000 | MEDICATED_PATCH | TRANSDERMAL | 3 refills | Status: AC
Start: 2021-08-30 — End: 2022-08-25
  Filled 2021-08-30: qty 28, 28d supply, fill #0

## 2021-08-30 MED ORDER — LORAZEPAM 0.5 MG TABLET
1.0000 mg | ORAL_TABLET | ORAL | Status: DC
Start: 2021-08-30 — End: 2021-08-30

## 2021-08-30 MED ORDER — HYDRALAZINE 20 MG/ML INJECTION SOLUTION
10.0000 mg | INTRAMUSCULAR | Status: DC | PRN
Start: 2021-08-30 — End: 2021-08-30

## 2021-08-30 MED ORDER — NICOTINE (POLACRILEX) 2 MG GUM
2.0000 mg | CHEWING_GUM | BUCCAL | Status: DC | PRN
Start: 2021-08-30 — End: 2021-08-30

## 2021-08-30 MED ORDER — ACETAMINOPHEN 650 MG RECTAL SUPPOSITORY
650.0000 mg | RECTAL | Status: DC | PRN
Start: 2021-08-30 — End: 2021-08-30
  Filled 2021-08-30: qty 1

## 2021-08-30 MED ORDER — POTASSIUM CHLORIDE ER 20 MEQ TABLET,EXTENDED RELEASE(PART/CRYST)
40.0000 meq | EXTENDED_RELEASE_TABLET | ORAL | Status: DC | PRN
Start: 2021-08-30 — End: 2021-08-30

## 2021-08-30 MED ORDER — LABETALOL 20 MG/4 ML (5 MG/ML) INTRAVENOUS SYRINGE
10.0000 mg | INJECTION | INTRAVENOUS | Status: DC | PRN
Start: 2021-08-30 — End: 2021-08-30

## 2021-08-30 MED ORDER — NICOTINE (POLACRILEX) 2 MG GUM
2.0000 mg | CHEWING_GUM | BUCCAL | 11 refills | Status: AC | PRN
Start: 2021-08-30 — End: 2022-08-25
  Filled 2021-08-30: qty 220, 30d supply, fill #0

## 2021-08-30 MED ORDER — ACETAMINOPHEN 325 MG TABLET
650.0000 mg | ORAL_TABLET | ORAL | Status: DC | PRN
Start: 2021-08-30 — End: 2021-08-30

## 2021-08-30 MED ORDER — ACETAMINOPHEN 650 MG/20.3 ML ORAL SOLUTION
650.0000 mg | ORAL | Status: DC | PRN
Start: 2021-08-30 — End: 2021-08-30

## 2021-08-30 MED ORDER — MAGNESIUM HYDROXIDE 400 MG/5 ML ORAL SUSPENSION
30.0000 mL | Freq: Two times a day (BID) | ORAL | Status: DC | PRN
Start: 2021-08-30 — End: 2021-08-30

## 2021-08-30 MED ORDER — BISACODYL 10 MG RECTAL SUPPOSITORY
10.0000 mg | RECTAL | Status: DC | PRN
Start: 2021-08-30 — End: 2021-08-30

## 2021-08-30 MED ORDER — CLOPIDOGREL 75 MG TABLET
75.0000 mg | ORAL_TABLET | Freq: Every day | ORAL | Status: DC
Start: 2021-08-30 — End: 2021-08-30
  Administered 2021-08-30: 75 mg via ORAL
  Filled 2021-08-30: qty 1

## 2021-08-30 MED ORDER — ASPIRIN 81 MG CHEWABLE TABLET
81.0000 mg | CHEWABLE_TABLET | Freq: Every day | ORAL | 0 refills | Status: AC
Start: 2021-08-31 — End: 2022-08-26
  Filled 2021-08-30: qty 100, 100d supply, fill #0

## 2021-08-30 NOTE — Procedures (Signed)
DEPARTMENT OF NEUROLOGICAL SURGERY   TRANSCRANIAL DOPPLER STUDY  EMBOLI DETECTION WITH AGITATED SALINE    Name: Charlotte [name]  MRN: [id number]      Study Date: 08/30/2021  Study Time: 1500        ID: [name] is a 84yr year-old female who arrived at 08/29/2021  9:37 PM for Other vision loss .  she was last known to be at her baseline on 08/29/21 at 0100 and was noted to have symptoms on 08/29/21 at 0700 while she was at home.  Neurology was consulted to see the patient due to concern of stroke.    Indication: Evaluation for shunt     Current Vital Signs  Temp: 36.4 C (97.6 F) (07/03 1210)  Temp src: Oral (07/03 1210)  Pulse: 70 (07/03 1210)  BP: 151/85 (07/03 1210)  Resp: 18 (07/03 1210)  SpO2: 97 % (07/03 1210)  Height: 167.6 cm (5\' 6" ) (07/03 0300)  Weight: 96.2 kg (212 lb 1.3 oz) (07/03 0300)    Study Findings  See [facility] report/images    Right-to Left Heart Shunt with Agitated Saline  Injection     Grade:             # of Emboli            Clinical Significance    0  0             No shunt  I  1-10             Low-grade shunt; Not clinically significant  II  11-30              Medium-grade shunt; May not be clinically significant  III  31-100             Medium-grade shunt; May be clinically significant:       IV  101-300           High-grade shunt; Grade IV, V, V+:  shunt sufficient to produce paradoxical emboli  V  >300 (uncountable)   V+  Curtain effect for     >3 cardiac cycles        Results:        Left MCA Depth:  55   Right MCA Depth:  63           Recording Duration    Session                      # Emboli Detected                 # Emboli Detected     1   Valsalva - No  NONE     NONE    1:56                         2   Valsalva - Yes  NONE     NONE    1:52           3   Valsalva - Yes  NONE     NONE    2:01        NEGATIVE FOR PFO SHUNT    Disregard 3rd sessopm              CPT: 06-21-1975     Report Electronically Signed by:  [id number]., RDMS, RVT      MD Interpretation:  No evidence of shunt.       Milda Smart, MD  Assistant Professor  Neurocritical Care  Department of Neurosurgery  Pager # [phone]  PI # [id number]      Pre-Procedure Time Out Checklist  (do not remove)     Attestation:  ID verified by two sources (select any two from list): MRN, DOB, and Name  Was this an emergency procedure?  no     I attest that I verified the following information prior to performing the procedure: Patient ID, Site, and Procedure

## 2021-08-30 NOTE — Discharge Instructions (Addendum)
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 We suspect this due to your high blood pressure, smoking and methamphetamine use. It is important that, together, we ensure you do not have another stroke in the future, as it could very easily leave you with permanent paralysis or even cause death.       Establish with PCP in 1-2 weeks for general preventative maintenance and hypertension management [facility] emailed for assistance)     Clinic: [facility]  Location: [address], [place], [place] [address]  Date: 09/07/21  Time: 4:00 PM  Provider: Francie Massing, NP  Phone: [phone]    You MUST take aspirin 81mg  every day without fail. You MUST take your blood pressure and cholesterol medications.  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  You will follow up in neurology stroke clinic. Please await this call to confirm your appointment time.  To evaluate you for an abnormal heart rhythm that may also have caused this stroke, you must get a cardiac monitor. This is very important as if you DO have an abnormal heart rhythm, you would need treatment for this to prevent further strokes.  Please continue your other medications as previously prescribed.       Medication List        START taking these medications      Aspirin 81 mg Chewable Tablet  Take 1 tablet by mouth every morning.  Start taking on: August 31, 2021     Losartan 25 mg Tablet  Commonly known as: COZAAR  Take 1 tablet by mouth every morning.     Nicotine 14 mg/24 hr Patch  Commonly known as: NICODERM CQ  Apply 1 patch to the skin every 24 hours. Indications: stop smoking     Nicotine 2 mg Gum  Commonly known as: NICORETTE  Chew 1-2 pieces by mouth if needed. Indications: stop smoking               Where to Get Your Medications        These medications were sent to [facility]  [address], [place]      Hours: Weekdays: 8:00 AM to 7:00 PM. Weekends & University  Holidays: 9:00 AM to 6:00 PM. Phone: [phone]   Aspirin 81 mg Chewable Tablet  Losartan 25 mg Tablet  Nicotine 14 mg/24 hr Patch  Nicotine 2 mg Gum         DISCHARGE INSTRUCTIONS:  1.  Take all medications as prescribed.    2.  Call 911 for the following stroke symptoms:    SUDDEN onset of any of the following:    - Speech or language impairment   - Confusion   - Facial droop on one side   - Weakness of one or more extremities, especially one side of the body   - Numbness of one or more extremities, especially one side of the body   - Significant vision change (double vision, loss of vision, etc)    - Loss of consciousness   - Loss of balance or coordination   - Severe headache without a known cause   - Persistent and severe nausea, vomiting, or vertigo    3. These are your modifiable risk factors for stroke: high blood pressure, tobacco use, stimulant use    Many of these risk factors are modifiable with lifestyle change. In addition to close follow up with your primary care doctor to  help you medically manage these risk factors, we recommend a healthy diet and implementing an exercise regimen. Smoking and recreational drug use are risk factors for strokes, so it is important that you stop smoking or using recreational drugs if you are a current user.

## 2021-08-30 NOTE — Care Plan (Signed)
Pt deemed stable for d/c. Discharge instructions and medications reviewed with pt via teach back. Pt aware of follow up appts and meds to pick up at [facility]. PIV removed and pt belongings release form signed and all belongings with pt. Pt d/c home with friend. All questions answered.    Problem: Adult Inpatient Plan of Care  Goal: Plan of Care Review  Outcome: Met  Goal: Patient-Specific Goal (Individualized)  Outcome: Met  Goal: Absence of Hospital-Acquired Illness or Injury  Outcome: Met  Goal: Optimal Comfort and Wellbeing  Outcome: Met  Goal: Readiness for Transition of Care  Outcome: Met

## 2021-08-30 NOTE — ED Nursing Note (Signed)
Pt got sent back to MRI because of claustrophobia. Offered meds, but declined and refusing MRI now. MD aware. Pt has someone coming to pick her up at 0700 and will be leaving because it's difficult for her to find a ride as she lives 1 hr away. Also, vision loss to right eye so can't drive herself.

## 2021-08-30 NOTE — Procedures (Signed)
DEPARTMENT OF NEUROLOGICAL SURGERY   TRANSCRANIAL DOPPLER STUDY  SPONTANEOUS EMBOLI DETECTION    Name: Charlotte [name]  MRN: [id number]      Study Date: 08/30/2021  Study Time: 1400        ID: Charlotte [name] is a 48yr year-old female who arrived at 08/29/2021  9:37 PM for Other vision loss .  she was last known to be at her baseline on 08/29/21 at 0100 and was noted to have symptoms on 08/29/21 at 0700 while she was at home.  Neurology was consulted to see the patient due to concern of stroke.    Indication: Embolic Stroke of Unknown Origin     Current Vital Signs  Temp: 36.4 C (97.6 F) (07/03 1210)  Temp src: Oral (07/03 1210)  Pulse: 70 (07/03 1210)  BP: 151/85 (07/03 1210)  Resp: 18 (07/03 1210)  SpO2: 97 % (07/03 1210)  Height: 167.6 cm (5\' 6" ) (07/03 0300)  Weight: 96.2 kg (212 lb 1.3 oz) (07/03 0300)    Study Findings  See Stentor report/images    Results:    NEGATIVE FOR HITS       Vessel: R Middle Cerebral Artery  Depth: 63  Number of HITS (High Intensity Transient Signals) During Recording: NONE  Number of Artifacts During Recording: 510  Duration of Recording: 31:33    Vessel: L Middle Cerebral Artery  Depth: 57  Number of HITS (High Intensity Transient Signals) During Recording: NONE  Number of Artifacts During Recording: 522  Duration of Recording: 31:33              Technically difficult due to poor penetration of temporal windows      CPT: [phone]       Report Electronically Signed by:  [id number],   B.S., RDMS, RVT      MD Interpretation:     No spontaneous emboli detected in the bilateral MCA in > 30 minutes of recording      Alver Sorrow, MD  Assistant Professor  Neurocritical Care  Department of Neurosurgery  Pager # [phone]  PI # [id number]      Pre-Procedure Time Out Checklist  (do not remove)     Attestation:  ID verified by two sources (select any two from list): MRN, DOB, and Name  Was this an emergency procedure?  no     I attest that I verified the following information prior to  performing the procedure: Patient ID, Site, and Procedure

## 2021-08-30 NOTE — Allied Health Consult (Signed)
PM&R -- ACUTE CARE SERVICE  OCCUPATIONAL THERAPY EVALUATION UNIT T8      Patient Name: Dietrich Ke   MRN:   [id number]   Date of Admission: 08/29/2021  Date of Onset:  08/29/2021  Diagnosis: diaphoresis    Prior treatment has not been provided for this diagnosis.  Authorizing Physician (First and Last Name) and PI#: Jeannine Kitten [[id number]]  Primary Service: (A) Stroke/Vascular Neurology  Language: English  Personal Protective Equipment Utilized: Gloves and Surgical Mask  Precautions: Aspiration risk  Total Minutes: 14  ----------------------------------------------------------------------------------------------------------------------------------------------------------------------------  CURRENT Discharge Recommendations:         Current Functional Level: Independent        Equipment Needs Upon Discharge: None        Anticipated Discharge Disposition: Home   ----------------------------------------------------------------------------------------------------------------------------------------------------------------------------    History of Present Illness/Injury (Including pertinent test results & procedures):   Per MD: (7/2) Eriyonna Matsushita is a 46yr year old female who arrived at 08/29/2021  9:37 PM for Other vision loss .  she was last known to be at her baseline on 08/29/21 at 0100 and was noted to have symptoms on 08/29/21 at 0700 while she was at home.  Neurology was consulted to see the patient due to concern of stroke.     This is a 46 year old with a history of migraine without aura, asthma, and HTN (not on medications) who presents with vision loss in the right eye. She states that she went to bed at around 1am last night feeling well. When she woke up at 7 am this morning, she noticed she could not see out of the right eye. No pain with eye movements. She thinks she can see better out of the periphery of her vision, but the center of her vision is dark. There was a moment when she  felt like her vision came back, but then it went away again, and this time she thought she noticed a "curtain" fall over her eye. There have not been any fluctuations in her vision since. She did go to an outside hospital where they performed [facility] and CTA head and neck, but we unfortunately do not have ability to access these images. They asked her to come to [facility] because there was question of possible carotid artery dissection vs contrast technique issue, and they had recommended further evaluation with carotid ultrasound. She was not evaluated by ophthalmology at the OSH.      Pt does not take any antiplatelet agents or anticoagulation. She says she used to take medication for HTN but does not currently take any. She has migraines and did have a headache yesterday, but her migraines  never present with vision loss. She currently doesn't have a headach.        Past Medical History:   No past medical history on file.     Past Surgical History:  No past surgical history on file.       Objective, Assessment and Plan:     08/30/21 8119   Time and Intention   Document Type evaluation   Mode of Treatment occupational therapy   Total Minutes, Occupational Therapy 14   Pain   Additional Documentation Pain Scale: Numbers Pre/Post-Treatment (Group)   Pain Assessment   Pretreatment Pain Rating 0/10 - no pain   Posttreatment Pain Rating 0/10 - no pain   General Information   Prior Level of Function  Ambulating independently;Independent with BADL's;Independent with IADL's   General Observations of Patient  pt semi reclined in bed, agreeable   Existing Precautions/Restrictions aspiration   History of Falls in the last 6 months no   Limitations/Impairments visual   Subjective   Subjective "I'm getting around just like usual"   Home Use of Assistive/Adaptive Equipment   Home Equipment Type None   Bathroom   Location, Bathroom first (main) floor   Living Environment   Current Living Arrangements home/apartment/condo   Vision  Assessment/Intervention   Comments, Visual R visual field cut   Sensory Assessment (Somatosensory)   Sensory Assessment (Somatosensory) sensation intact   Hearing Assessment   Hearing Status WFL   Range of Motion (ROM)   RUE WFL   LUE WFL   Manual Muscle Testing   RUE WFL   LUE WFL   Cognition   Cognitive Function WFL   Bed Mobility   Bed Mobility bed mobility (all) activities   All Activities, Independence (Bed Mobility) independent   Assistive Device (Bed Mobility) No use of hospital bed features   Which direction did they exit bed today? right   Functional Mobility   Transfers sit-stand transfer;stand-sit transfer   Sit-Stand Independence (Transfers) independent   Stand-Sit Independence (Transfers) independent   Bathing Assessment/Intervention   Independence Level (Bathing) bathing skills;independent   Upper Body Dressing Assessment/Training   Independence Level (Upper Body Dressing) upper body dressing skills;independent   Position (Upper Body Dressing) unsupported standing   Lower Body Dressing Assessment/Training   Independence Level (Lower Body Dressing) lower body dressing skills;independent   Position (Lower Body Dressing) edge of bed sitting   Grooming Assessment/Training   Independence Level (Grooming) grooming skills;independent   Position (Grooming) unsupported standing   Toileting Assessment/Training   Independence Level (Toileting) toileting skills;independent   Motor Skills   Motor Assessment coordination   Coordination WFL   Posture   Comment, Posture WFL   Balance   Balance Assessment sitting static balance;sitting dynamic balance;sit to stand dynamic balance;standing static balance;standing dynamic balance   Static Sitting Balance independent   Dynamic Sitting Balance independent   Sit to Stand Dynamic Balance independent   Static Standing Balance independent   Dynamic Standing Balance independent   Activity Tolerance   Activity Tolerance WFL   Therapy Assessment/Plan (OT)   Current Functional  Level Independent   Criteria for Skilled Therapeutic Interventions Met (OT) no;no problems identified which require skilled intervention   Therapy Frequency (OT) evaluation only   Comment, Therapy Assessment/Plan (OT) Pt presents with functional capacity to perform ADLs and functional mobility/transfers in a safe manner. Pt was educated on activity recommendations, ADLs, and how to delegate assistance as needed. Pt does not require further skilled OT treatment at this time.   Evaluation Complexity   Overall Complexity of Evaluation (OT) low complexity   Therapy Plan Review/Discharge Plan (OT)   Therapy Plan Review (OT) evaluation/treatment results reviewed;care plan/treatment goals reviewed;risks/benefits reviewed;participants included;patient   Anticipated Discharge Disposition (OT) home   Discharge Summary (OT)   Reason for Discharge (OT) no further needs identified   Discharge Milestones   No OT Barriers to Discharge True  (home)         **If patient discharged prior to next treatment the following summary above reflects current progress and further recommendations**    Patient / Caregiver Education Today: role of OT, ADLs, transfers               Method of Teaching: demonstration and verbal             Learner: patient  Response: verbalizes understanding and able to give return demonstration    Recommended Frequency of Treatments: Evaluation only    Duration of Treatment: Evaluation Only or No further treatment necessary, DC OT    Plan of Care Re-Certification Due (90 Days from original Plan of Care, if no major change.  90 days from today if Plan of Care re-sent due to major change): 11/28/2021    Was a new therapy order generated with a "Pending Discharge" priority designation?  No. Not applicable--A new PT/OT order was not issued    Report Electronically Signed By:    Stormy Fabian OTR/L PX#[id number]  Occupational Therapist II  Dept. Of Physical Medicine & Rehabilitation/Acute Care Services  Vocera,  Pager/OTScheduling [phone],  Secure Chat

## 2021-08-30 NOTE — Procedures (Signed)
DEPARTMENT OF NEUROLOGICAL SURGERY   TRANSCRANIAL DOPPLER STUDY    Name: Charlotte [name]  MRN: [id number]      Study Date: 08/30/2021  Study Time: 1300        ID: Symone Cornman is a 76yr year-old female who arrived at 08/29/2021  9:37 PM for Other vision loss .  she was last known to be at her baseline on 08/29/21 at 0100 and was noted to have symptoms on 08/29/21 at 0700 while she was at home.  Neurology was consulted to see the patient due to concern of stroke.    Indication:  Ischemic Stroke    Current Vital Signs  Temp: 36.4 C (97.6 F) (07/03 1210)  Temp src: Oral (07/03 1210)  Pulse: 70 (07/03 1210)  BP: 151/85 (07/03 1210)  Resp: 18 (07/03 1210)  SpO2: 97 % (07/03 1210)  Height: 167.6 cm (5\' 6" ) (07/03 0300)  Weight: 96.2 kg (212 lb 1.3 oz) (07/03 0300)    Study Findings  See Stentor report/images    Normal Values:  MCA  50-74 cm/sec  ACA  38-62 cm/sec  PCA   32-52 cm/sec  ICA (EC) 28-48 cm/sec  VA  27-45 cm/sec  BA  32-52 cm/sec      Results:       Vessel  MFV  PI  Comment(s)  L-MCA  53 cm/sec       0.9  Normal Appearing Waveform  L-ACA  50 cm/sec       0.8  Normal Appearing Waveform  L-PCA  29 cm/sec       0.8  Normal Appearing Waveform  L-ICA  30 cm/sec       1.0  Normal Appearing Waveform             R-MCA  46 cm/sec       1.0  Normal Appearing Waveform  R-ICA  31 cm/sec       1.0  Normal Appearing Waveform    BA  37 cm/sec       0.9  Normal Appearing Waveform  L-Vert  23 cm/sec       0.9  Normal Appearing Waveform  R-Vert  18 cm/sec       1.0  Normal Appearing Waveform        Technically limited and difficult exam due to poor penetration of temporal windows      CPT: Full [id number]       Report Electronically Signed By:  06-21-1977,   B.S., RDMS, RVT      MD Interpretation:     Normal waveforms and velocities. Unable to fully evaluate the right anterior circulation due to poor temporal windows.       Alver Sorrow, MD  Assistant Professor  Neurocritical Care  Department of Neurosurgery  Pager  # [phone]  PI # [id number]      Pre-Procedure Time Out Checklist  (do not remove)     Attestation:  ID verified by two sources (select any two from list): MRN, DOB, and Name  Was this an emergency procedure?  no     I attest that I verified the following information prior to performing the procedure: Patient ID, Site, and Procedure

## 2021-08-30 NOTE — TOC Discharge Planning (AHS/AVS) (Signed)
You have a follow up appointment scheduled to see your primary care provider (PCP).      Clinic: [facility]  Location: [address] [place], [place] [address]  Date: 09/07/21  Time: 4:00 PM  Provider: Francie Massing, NP  Phone: [phone]

## 2021-08-30 NOTE — Discharge Summary (Signed)
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  - Hypertension management to achieve SBP 110-130 long term   -Losartan  daily  - Daily aspirin  (life long)  - Stop smoking, stop stimulant use    Clinic follow-up:  - PCP appointment made to establish care on 09/07/21 with [facility]. Info provided in AVS.  - Stroke neurology clinic in 3-4 months to discuss hypercoag results, secondary prevention (HTN, tobacco, stimulant use)  - Ophthalmology (retinal specialist) follow-up in 1-4 weeks.  ----------------------------------------------------------------------  Incidental Findings that need PCP Follow-up:  None  -----------------------------------------------------------------------    NEUROLOGY SERVICE  HOSPITAL DISCHARGE SUMMARY    PATIENT:  Charlotte [name]  MRN:         [id number]    NOTE DATE / TIME:  08/30/2021  @ 13:59    Admission date:  08/29/2021  9:37 PM    Attending at discharge:  Karl Ito, DO  Senior resident:  Micheline Chapman  APP:  Dani Gobble, NP / Vernia Buff, NP    DISCHARGE DIAGNOSES & PMH:  Active Hospital Problems    *CRAO (central retinal artery occlusion), right      Hypertension      Tobacco abuse      DISCHARGE MEDICATIONS:     Medication List        START taking these medications      Aspirin 81 mg Chewable Tablet  Take 1 tablet by mouth every morning.  Start taking on: August 31, 2021     Losartan 25 mg Tablet  Commonly known as: COZAAR  Take 1 tablet by mouth every morning.     Nicotine 14 mg/24 hr Patch  Commonly known as: NICODERM CQ  Apply 1 patch to the skin every 24 hours. Indications: stop smoking     Nicotine 2 mg Gum  Commonly known as: NICORETTE  Chew 1-2 pieces by mouth if needed. Indications: stop smoking               Where to Get Your  Medications        These medications were sent to [facility]  [address], [place] [address]      Hours: Weekdays: 8:00 AM to 7:00 PM. Weekends & University Holidays: 9:00 AM to 6:00 PM. Phone: [phone]   Aspirin 81 mg Chewable Tablet  Losartan 25 mg Tablet  Nicotine 14 mg/24 hr Patch  Nicotine 2 mg Gum       ALLERGIES:  No Known Allergies    CONSULTING SERVICES:  NEUROLOGY CONSULT  OPHTHALMOLOGY CONSULT  STROKE COORDINATOR CONSULT  SOCIAL SERVICES/ SOCIAL WORKER CONSULT  CLINICAL CASE MANAGEMENT CONSULT  SUBSTANCE USE NAVIGATOR (SUN)    PROCEDURES:  None    REASON FOR ADMISSION / BRIEF HPI:  Charlotte [name] is a 48yr RH F (she/her/hers) with stated PMH of migraine without aura and tobacco abuse with more distant history of depression and anxiety ("I tried all the medications", no current prescriptions) who presented to OSH she awakened at approx 7am on 08/29/2021 after going to bed on the evening of 08/28/2021  in her usual state of health, and noticed profound right eye vision loss. She denied eye or head pain and no other symptoms noted at the time. She went about her morning and thinks that there was partial improvement (could see shapes and some right peripheral  vision) prior to worsening again. She stated she was evaluated by an OSH; tells [place] she left AMA and was advised to come to [facility]. (No records available after attempting to locate).      She says that she's been under unusually high stress lately and consequently has had a higher frequency of posterior neck pain and migraine headaches, however the quality and character of these pains were not unusual in any way. She had a migraine headache the day prior to symptoms, but this headache was also consistent with prior headaches. Typically, she takes ASA and acetaminophen for headache pain which she denied taking during her migrainous symptoms PTA. Denied recent trauma, falls, high velocity neck movement or unusual neck pain.      HOSPITAL COURSE:  She arrived to [place] on 08/29/2021 at 2220. Intial exam notable for OD vision loss. Upon initial neurologic evaluation at [facility], NIH 1 (partial vision loss). Exam notable for elevated blood pressures (SBP 180s), central > peripheral right eye vision loss. CTH did not reveal a bleed or LVO. Carotid arteries appear patent both extra and intracranially.  Subsequent exams unchanged with continued R central vision loss with limited light and color sensitivity on right temporal visual fields.     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 Embolic etiology less likely, however we are awaiting MRI to evaluate for evidence of embolic strokes. TCD with bubble and spontaneous emboli detection preliminary read was negative.    TTE without evidence of thrombus, or PFO/VSD. Due to her young age, antiphospholipid hypercoagulable studies sent, which will result following discharge. There is no evidence of atherosclerotic disease or carotid dissection on CTA head/neck. No CBC or constitutional findings suggestive of a liquid oncological process.     TOC emailed to assist patient with establishing with PCP in her area. Additionally, patient will need to follow up with ophthalmology (retinal specialist) and stroke neurology as an outpatient.     # R Monocular vision loss, c/f CRAO   Etiology: suspect small vessel (HTN, life-long smoker)  Diagnostic:  - Utox positive for amphetamines, EtOH borderline positive  - ECG grossly normal  - TTE with bubble - mild LVH, no shunt within technical limits  - TCD with spontaneous embo and bubble - preliminary negative for HITS  - CTA without evidence of dissection, stenosis or occlusion  - Appreciate ophthamology evaluation     - Ziopatch on discharge     - Antiphospholipid hypercoagulable studies: (will result after discharge)               - dRVVT               - Anticardiolipin IgG/IgM              - Beta-2 glycoprotein 1 IgG/IgM     Therapeutic  - Smoking cessation counseling provided  - SUN consultation  - HTN management per below     Secondary Stroke Prevention  - Long term blood pressure goal 110-130/60-80  - LDL goal < 70: Statin therapy deferred due to LDL 80 and no evidence of intracranial or great vessel atherosclerotic disease.  - HgbA1c goal < 7%: Current: pending  - Recommend daily ASA 81mg  (life-long)  - Followup with Stroke Clinic in 3-4 months.     # Hypertension, suspect essential  - Losartan 25mg  daily  - Follow up with PCP              -  Email sent to [facility] to assist with establishing care     # Tobacco and stimulant abuse  Felt to be primary contributor to CRAO  - Smoking cessation counseling   -nicotine patch / gum/ lozenge PRN  - SUN consult placed    COMPLICATIONS / ADVERSE DRUG EVENTS:  None    PERTINENT LABS / STUDIES:  LAB TESTS/STUDIES     CBC  Recent labs for the past 72 hours     08/29/21  2214 08/30/21  0147 08/30/21  0750   WBC 8.6 8.4 8.4   HGB 14.4 14.5 16.2*   HCT 42.5 43.5 48.4*   PLT 252 237 275   MCV 88.2 88.6 89.2   MCH 29.8 29.5 29.8   MCHC 33.8 33.3 33.4   RDW 13.9 13.9 13.9   MPV 8.6 8.4 8.7    BASIC METABOLIC PANEL  Recent labs for the past 72 hours     08/29/21  2214 08/30/21  0147   NA 136  --    K 3.8  --    CL 103  --    BUN 10  --    CR 0.86  --    GLU 86  --    CA 9.0 8.9      COAGs  Recent labs for the past 144 hours     08/30/21  0147   INR 1.08   APTT 32.0   PT 9.9    Lab Results   Lab Name Value Date/Time    Barbiturates Screen, Urine NEGATIVE 08/30/2021 1012    Benzodiazepines Screen, Urine NEGATIVE 08/30/2021 1012    Cocaine Metabolite Scrn, Urine NEGATIVE 08/30/2021 1012    Opiates Screen, Urine NEGATIVE 08/30/2021 1012    Amphetamine Screen, Urine PRESUMPTIVE POSITIVE (Abnl) 08/30/2021 1012        DIABETES & LIPIDS PANEL  Recent labs for the past 144 hours     08/29/21  2214   CHOL 148   LDLC 74    LDLCDIRECT 80   HDL 47   TRIG 137       Latest Reference Range & Units 08/29/21 22:14   Ethanol, Plasma <10 mg/dL 13 (H)   (H): Data is abnormally high    Recent labs for the past 144 hours     08/30/21  1012   UACOLLECTION CLEAN CATCH   UACOLOR Amber*   UACLARITY Clear   SGURINE 1.050*   UAPH 6.0   UAOCCULT Negative   UABIL Negative   UAKET Trace*   UAGLU Negative   UAPRO 30*   UAURO 2.0   UANIT Negative   UALEUK Trace*   MICROSCOPIC Indicated   UAMWBC 1   UAMRBC 4*   BACTERIA Moderate*   MUCOUS Moderate*   UCULTIND INDICATED*         CT ANGIO HEAD 08/29/2021 9:47 PM   1.   No evidence for large vessel occlusion.   2.   No evidence for stenosis, dissection, aneurysm or vascular malformation.      CT Cerebral Perfusion With Contrast 08/29/2021 9:47 PM   No acute infarct or ischemia.      CT ANGIO NECK 08/29/2021 9:47 PM   No stenosis or occlusion.      TTE with bubble 08/30/2021  SUMMARY:    1. There is no evidence of pericardial effusion.    2. Left ventricular diastolic function is abnormal. Stage I: Impaired early left ventricular   relaxation (Abnormal relaxation pattern).    3.  Mild concentric left ventricular hypertrophy.    4. Normal right ventricular size, wall thickness, and systolic function.    5. The left atrium is normal in size and structure.    6. The right atrium is normal in size and structure.    7. The inferior vena cava is dilated.    8. Saline contrast bubble study was negative, with no evidence of any intracardiac shunt.    9. Although bubble study was negative, image quality is poor which limits sensitivity of this test.     TCD with spontaneous bubble and emboli detection 08/30/2021 (PRELIMINARY)  No HITS - negative study, no evidence of intracardiac shunt.    DISCHARGE EXAM:    Vitals:  BP (!) 151/85   Pulse 70   Temp 36.4 C (97.6 F) (Oral)   Resp 18   Ht 1.676 m (5\' 6" )   Wt 96.2 kg (212 lb 1.3 oz)   SpO2 97%   BMI 34.23 kg/m     GCS  Best Eye Response: 4-->(E4) spontaneous, Best Verbal  Response: 5-->(V5) oriented, Best Motor Response: 6-->(M6) obeys commands, Glasgow Coma Scale Score: 15    NIH Stroke Scale:  1a. Level of Consciousness: 0-->Alert, keenly responsive  1b. LOC Questions: 0-->Answers both questions correctly  1c. LOC Commands: 0-->Performs both tasks correctly  2. Best Gaze: 0-->Normal  3. Visual: 1-->Partial hemianopia  4. Facial Palsy: 0-->Normal symmetrical movements  5a. Motor Arm, Left: 0-->No drift, limb holds 90 (or 45) degrees for full 10 secs  5b. Motor Arm, Right: 0-->No drift, limb holds 90 (or 45) degrees for full 10 secs  6a. Motor Leg, Left: 0-->No drift, leg holds 30 degree position for full 5 secs  6b. Motor Leg, Right: 0-->No drift, leg holds 30 degree position for full 5 secs  7. Limb Ataxia: 0-->Absent  8. Sensory: 0-->Normal, no sensory loss  9. Best Language: 0-->No aphasia, normal  10. Dysarthria: 0-->Normal  11. Extinction and Inattention (formerly Neglect): 0-->No abnormality  Total (NIH Stroke Scale): 1     General:  NAD, resting comfortably in bed  HEENT: NC/AT, anicteric sclera, mucosa pink/moist  Lungs: Normal rate, rhythm, and effort. CTA throughout.  Heart: rrr, no m/r/g  Ext: No clubbing/cyanosis; no LE edema  Skin: No rashes forearms, lower legs, or face     Mental Status:   State:  Alert, oriented to self, month/year and situation. Pleasant. Mildly tremulous, pressured speech, appears anxious. Appropriately conversant.  Use of language:  Fluent, intact expression and comprehension (follows commands). No paraphasias, no dysarthria noted.     Cranial Nerves:  CN 1:        Smell testing deferred.  CN 2:          OD: pupil 31mm with trace to no reactivity to direct light stimulation; minimally reactive to consensual stimulation (+RAPD). Able to discern some colors and shapes at temporal peripheral field only.  OS: pupil 44mm, round, reactive to direct light stimulation; no consensual response. Visual fields full, able to read ID badge and small print on cell  phone.  CN 3,4,6:  EOM intact without nystagmus.  CN 5:        Sensation to LT intact and symmetric at V1, V2, V3.  CN 7:        Facial symmetrical at rest and with expression.  CN 8:        Hearing intact to voice.  CN 9, 10:  Palate elevation symmetric.  CN 11:  Trapezius strength 5/5.  CN 12:      Tongue protrusion midline.     Motor:  Normal bulk and tone, no adventitious movements. No pronator drift.       Deltoid Biceps Triceps Wrist Flexors Wrist Extensors Finger Flexors Finger Extensors Finger Abductors   R 5 5 5 5 5 5 5 5    L 5 5 5 5 5 5 5 5                                                        Hip Flexors Hip Extensors  Knee Flexors Knee Ext Dorsi- Flexion Plantar- Flexion   R 5 5 5 5 5 5    L 5 5 5 5 5 5       Reflexes:    Biceps-C5 BR-C6 Patellar-L4   R 2+ 2+ 2+   L 2+ 2+ 2+      Sensory:  Light touch:  Intact, symmetric throughout bilateral upper and lower extremities.       Coordination:  F-to-N:  Intact, no ataxia or dysmetria    H-to-S:  Intact, no ataxia or dysmetria  Rapid-alt movements: No bradykinesia fingertapping.     Cortical:  Extinction on DSS (neglect):  No extinction with simultaneous tactile stimulation    CONDITION:  Patient is stable for discharge.  DISPOSITION:  Discharge to home.    DISCHARGE INSTRUCTIONS:  1.  Take all medications as prescribed.  2.  Call 911 if you experience new vision changes, speech changes, or face, arm, or leg weakness or sensory loss.  3.  Diet restrictions:  none  4.  Activity restrictions:  none - be mindful of your right side.    OUTPATIENT FOLLOW-UP PLAN:  - Establish with PCP in 1-2 weeks for general preventative maintenance and hypertension management (TOC emailed for assistance)    Clinic[facility]  Location: [address], [place], [place] [id number]  Date: 09/07/21  Time: 4:00 PM  Provider: Francie MassingPaul Winchell, NP  Phone: [phone]    - Stroke neurology clinic to discuss hypercoag results, secondary prevention (HTN, tobacco, stimulant use),  ziopatch  - Ophthalmology (retinal specialist) follow-up in 1-4 weeks.    PENDING STUDIES AT DISCHARGE:  - Hypercoagulable studies  - Ziopatch (cardiac monitor)    Patient was seen and discussed with Vascular Neurology attending. Additional recommendations may follow in addendum.    Report Electronically Signed By:  Donne HazelSteven B Augustine, NP (AGACNP Student)    I have been assigned to this patient, along with a  NP Student  and have reviewed and agree with the  NP Student  documentation except as otherwise noted/documented.    Vernia BuffBrenna Oakes, MSN, AGACNP-BC, ANVP-BC, SCRN  #1 Secure Chat/ Pgr: [id number]  Hours: [phone]; After Hours pgr: [id number]     +  Electronically signed by:  Esmond Camperimothy Cuyegkeng, DO  Neurology, PGY-3      This patient was seen, evaluated, and care plan was developed with the APP and resident. I agree with the assessment and plan as outlined in the APP's and resident's note with these additions and clarifications: Her neurological examination remained stable by the time of discharge.  I suspect the primary contributing risk factors to her central retinal artery occlusion of her right eye is related to chronic tobacco abuse, hypertension, and contribution with presumptive  methamphetamine positivity.  She is counseled the importance of refraining from these social risk factors that can contribute to stroke.  The remaining diagnostic studies are as above and she will receive an outpatient cardiac event monitor.  She should be on antiplatelet treatment along with additional stroke risk factor modification as described above.. A total of 35 minutes was spent in patient care, >50% of which was counseling. Report electronically signed by Willene Hatchet, D.O. Attending     Note: This note was created in part with Tax adviser.  Every effort has been made to correct any errors in the voice-recognition / dictation, but some may have been missed.  If there are any questions, please contact the author for  clarification and revision if needed.

## 2021-08-30 NOTE — Nurse Assessment (Signed)
ASSESSMENT NOTE    Note Started: 08/30/2021, 0300     Pt arrived to unit from ER at 300 via gurney accompanied by pt transport. Admission assessment initiated. Initial assessment completed and recorded in EMR.  Plan of Care reviewed and appropriate, discussed with patient.  Aox4, VSS afebrile denies pain. RA.  No signs or symptoms of distress.  Ambulates independently. Loss of vision to R eye.  Pt awaiting for discharge around 7am.  Ardine Bjork, RN RN

## 2021-08-30 NOTE — Nurse Assessment (Signed)
ASSESSMENT NOTE    Note Started: 08/30/2021, 07:10     Initial assessment completed and recorded in EMR.  Report received from night shift nurse and orders reviewed. Plan of Care reviewed and updated and appropriate, discussed with patient.  Team at bedside. Pt agreeable to stay for MRI procedure. Vivia Ewing, RN RN

## 2021-08-30 NOTE — Allied Health Consult (Addendum)
[facility] -- ACUTE CARE SERVICE    PHYSICAL THERAPY EVALUATION      Name: Charlotte [name]   MRN: [id number]   Date of Service: 08/30/2021  Hospital Unit: [id number]  Time In: 0930    Time for Eval: 8 minutes                                                                                                                                               INTAKE INFORMATION AND HISTORY:                       Authorizing Provider and PI #: Buck Mam, DO  Primary Service: (A) Stroke/Vascular Neurology   Date of Admission: 08/29/2021  Diagnosis: R monocular vision loss, c/f CRAO, stroke workup   Language: English    Precautions: Aspiration  Isolation Precautions: Personal Protective Equipment Utilized: Gloves and Surgical Mask    History of Present Illness/Injury (Including pertinent test results & procedures):    Per MD: (7/2) Charlotte [name] is a 46yr year old female who arrived at 08/29/2021  9:37 PM for Other vision loss .  she was last known to be at her baseline on 08/29/21 at 0100 and was noted to have symptoms on 08/29/21 at 0700 while she was at home.  Neurology was consulted to see the patient due to concern of stroke.     This is a 46 year old with a history of migraine without aura, asthma, and HTN (not on medications) who presents with vision loss in the right eye. She states that she went to bed at around 1am last night feeling well. When she woke up at 7 am this morning, she noticed she could not see out of the right eye. No pain with eye movements. She thinks she can see better out of the periphery of her vision, but the center of her vision is dark. There was a moment when she felt like her vision came back, but then it went away again, and this time she thought she noticed a "curtain" fall over her eye. There have not been any fluctuations in her vision since. She did go to an outside hospital where they performed [facility] and CTA head and neck, but we unfortunately do not have ability to access these images.  They asked her to come to [facility] because there was question of possible carotid artery dissection vs contrast technique issue, and they had recommended further evaluation with carotid ultrasound. She was not evaluated by ophthalmology at the OSH.      Pt does not take any antiplatelet agents or anticoagulation. She says she used to take medication for HTN but does not currently take any. She has migraines and did have a headache yesterday, but her migraines  never present with vision loss. She currently doesn't have  a headach.       Past Medical History:    No past medical history on file.      Past Surgical History  No past surgical history on file.       Social History:    Social History     Tobacco Use    Smoking status: Every Day     Packs/day: 0.50     Types: Cigarettes   Substance Use Topics    Drug use: Yes     Types: Marijuana       PHYSICAL THERAPY EXAMINATION:  RN cleared patient to work with PT.        08/30/21 0930   Physical Therapy Time and Intention   Mode of Treatment Co-treatment; ocupational therapy;physical therapy   Total Minutes, Physical Therapy 8  ([id number])   Subjective Pt reports she feels fine, just some vision loss in the R eye.   Pain   Additional Documentation Pain Scale: Numbers Pre/Post-Treatment (Group)   Pain Assessment   Pretreatment Pain Rating 0/10 - no pain   Posttreatment Pain Rating 0/10 - no pain   General Information   Existing Precautions/Restrictions aspiration   Living Environment   Current Living Arrangements home/apartment/condo   Comments, Living Pt lives in [facility], no STE.   Comment, Availability of caregiver support Pt lives w/ boyfriend who is available to assist.   Prior Level of Function   Prior Level of Function  Ambulating independently;Independent with BADL's;Independent with IADL's;Driving   Which direction does the pt normally exit bed? either   Home Use of Assistive/Adaptive Equipment   Home Equipment Type N/A   Limitation/Impairments   Limitations/Impairments  visual   Falls   History of Falls in the last 6 months no   Observation   General Observations of Patient Pt received semi-recumbent in bed on room air.   Cognition   Cognitive Function WFL   Edema   Comment, Edema none noted   Sensory Assessment (Somatosensory)   Sensory Assessment (Somatosensory) sensation intact   Range of Motion (ROM)   RUE WFL   LUE WFL   RLE WFL   LLE WFL   Manual Muscle Testing   RUE WFL   LUE WFL   RLE WFL   LLE WFL   Bed Mobility   Bed Mobility supine-sit;sit-supine;scooting, supine;scooting, edge of bed   Scooting, Supine Assistance (Bed Mobility) independent   Scooting, Edge of Bed Assistance (Bed Mobility) independent   Supine-Sit Independence (Bed Mobility) independent   Sit-Supine Independence (Bed Mobility) independent   Assistive Device (Bed Mobility) No use of hospital bed features   Which direction did they exit bed today? right   Transfers   Transfers sit-stand transfer;stand-sit transfer   Sit-Stand Independence (Transfers) independent   Stand-Sit Independence (Transfers) independent   Assistive Device Size (Transfer) N/A   Assistive Device (Transfers) no assistive device used   Gait Assessment   Gait Locomotion gait/ambulation independence;gait/ambulation assistive device;distance ambulated;gait pattern   Independence Level (Gait) independent   Assistive Device Size (Gait) N/A   Assistive Device (Gait) no assistive device used   Distance in Feet (Gait) 350 ft   Pattern (Gait) step-through   Comment, Gait Pt ambulated quickly & denied any difficulty.   Balance   Balance Assessment sitting dynamic balance;sitting static balance;sit to stand dynamic balance;standing static balance;standing dynamic balance   Static Sitting Balance independent   Dynamic Sitting Balance independent   Sit to Stand Dynamic Balance independent   Static Standing Balance independent  Dynamic Standing Balance independent   Activity Tolerance   Activity Tolerance WFL   Safety Issues, Functional Mobility    Impairments Affecting Function (Mobility) visual/perceptual   Comment, Safety Issues/Impairments (Mobility) Pt R eye visual deficits. Visual tracking intact, however pt cannot see directly in front of her. Peripheral is a blurry. 45 deg to R is clearest.   Discharge Milestones   No PT Barriers for Identified Discharge Disposition True  (home w/ assist)            TINETTI BALANCE AND GAIT EVALUATION   BALANCE SECTION  Type of footwear: grippy socks     Seated Balance:     1      0. Leans or slides in chair   1. Steady  Arising from Chair:     2  0. Unable without help   1. Able but uses arms to help   2. Able without use of arms   Attempts to Arise from Chair   2  0. Unable without help  1. Able but requires more than one attempt   2. Able to arise with one attempt   Immediate Standing Balance (first 5 sec)  2  0. Unsteady (staggers, moves feet, marked trunk sway)   1. Steady with external support  2. Steady without external support  Standing Balance     2  0. Unsteady  1. Steady but wide stance >4" or uses external support   2. Steady without external support (medial heels <4")  Nudged (3 X)      2  0. Begins to fall   1. Staggers, grabs, but catches self   2. Steady   Eyes closed (medial heels <4" apart)  1  0. Unsteady  1. Steady  Turning 360 degrees    2  0. Discontinuous steps   1. Continuous steps  0. Unsteady (grabs, swaggers)  1. Steady  Sitting Down      2  0. Unsafe (misjudges distance, falls into chair)   1. Uses arms or lacks smooth motion  2. Safe, smooth motion (no hands)  Balance Section Subtotal16/16    GAIT SECTION  Type of assistive device used: none  Level of assistance: none     Initiation of Gait (immediately after "go")  1  0. Any hesitancy or several attemps to start   1. No hesitancy   Step Length & Step Height (RIGHT LE)  2  0. Right swing foot does not pass Left stance foot  1. Right swing foot passes  Left stance foot  0. Right swing foot does not completely clear floor  1. Right swing foot completely clears floor  Step Length & Step Height (LEFT LE)  2  0. Left swing foot does not pass Right stance foot  1. Left swing foot passes L Right stance foot  0. Left swing foot does not completely clear floor  1. Left swing foot completely clears floor  Step Symmetry     1  0. Right & Left step length do not appear equal  1. Right & Left step length appear equal  Step Continuity     1  0. Stopping or discontinuity between steps  1. Steps appear continuous  Path (Observe in relation to 1' diameter)  2  0. Marked deviation  1. Uses AD and/or mild-to-moderate deviation  2. Straight without AD  Trunk       2  0. Uses AD or has marked sway  1. No sway,  but flexes hips/knees/back or uses arms  2. No sway, does not use arms or flex back/knees  Walking Stance     1  0. Heels apart OR foot collisions (e.g. Scissoring)  1. Heels almost touching while walking    Gait Section Subtotal12/12    TOTAL SCORE     28/28  FALL RISK:  <19 High;   19-23 Medium;   24-28 Low  Comments: low fall risk         No additional interventions beyond this evaluation were provided to the patient at this time.        Patient Status after PT session: Pt semi-recumbent in bed, call light in reach.       ASSESSMENT / RECOMMENDATIONS:    Physical Therapy Assessment:  The patient is a 46yr old female who admitted to [facility] for R monocular vision loss, c/f CRAO, stroke workup . Pt independent at this time and requires no further PT services at this time.     Other Consults Requested: N/A    Physical Therapy Recommendations for Nursing: Patient is Independent and should be encouraged to mobilize out of bed and walk  frequently every day.        CURRENT Discharge Recommendations:        Disposition: Home with Assistance--boyfriend         Is Patient Cleared for Disposition above: yes  Current Level of Assistance or Supervision:  Independent for Mobility  Continued Physical Therapy:  N/A                            Equipment Size: N/A        Equipment:  N/A                Referral Recommendations (for follow up): N/A    Patient / Caregiver Education Today:   Physical Therapy Plan of Care/Consent & Recommendations (listed above)      Method of Teaching: Verbal    Learner: Patient    Response: Verbalizes understanding and Able to give return demonstration    GOALS / TREATMENT PLAN / FUNCTIONAL PROGNOSIS:  Patient's Goals: To return home.     Physical Therapy Goals:    Patient to demonstrate safe basic functional mobility skills with or without caregiver assistance. (GOAL MET during eval--Pt discharged from PT)    Prognosis:   Patient has no further acute PT needs at this time. Patient discharged from PT (see recommendations above).    Barriers that may interfere with PT POC: R eye visual deficits      Treatment Plan:    N/A     Recommended Frequency of Treatment: Evaluation only.    Recommended Duration of Treatment: One visit (eval only).    Interim Report Due:  Not Applicable--One time eval/treat only.       Patient's Clinical Presentation: Stable/Uncomplexed   The components of this evaluation necessitated a Low complexity level of clinical decision making.     Reported by:  Raelyn Mora, SPT     I was present in the room guiding the student in service delivery while the student was participating in the provision of services, and I was not engaged in treating another patient or doing other tasks at the same time   I am responsible for the assessment and plan as stated within the progress note.     Rexene Alberts  Physical Therapist  PI [phone]  Dept of  PM&R / Acute Care services  Vocera: [phone]

## 2021-08-30 NOTE — Care Plan (Signed)
Problem: Adult Inpatient Plan of Care  Goal: Plan of Care Review  Outcome: Ongoing, Progressing  Flowsheets (Taken 08/30/2021 0408)  Plan of Care Reviewed With: patient  Progress: improving  Outcome Evaluation: Pt arrived from ER at 300.  AOx4, VSS, afebrile.  RA. Denies pain or discomfort.  R eye vision loss, no deficies noted.  Up ad lib and OOB independently.  Up to toilet indendently.  Neuro check and NIHSS done.  Call light in reach.  Frequent nurisng rounds.  Sleeping in betwene care.  Awaiting discharge.

## 2021-08-30 NOTE — Progress Notes (Signed)
VASCULAR NEUROLOGY DAILY PROGRESS NOTE                                     PATIENT:  Charlotte [name]  MRN:         [id number]     NOTE DATE / TIME:  08/30/2021  @ 10:34    ADMISSION DATE: 08/29/2021  9:37 PM  Hospital Day:  LOS: 0 days     ID: Charlotte [name] is a 39yr RH F with stated PMH of migraine without aura and tobacco abuse with distant history of depression and anxiety ("I tried all the medications", no current prescriptions) who presented to OSH yesterday after waking up with OD vision loss and left AMA to come to [facility] for further workup.                            INTERVAL   - No acute events  - Seen by ophtho, findings consistent with OD CRAO    Additional History Obtained:  On the evening of 08/28/2021, she was in her usual state of health. She awakened at approx 7am on 08/29/2021 and noticed profound right eye vision loss. No pain, no other symptoms noted at the time. She went about her morning and thinks that there was partial improvement (could see shapes and some right peripheral vision) prior to worsening again. She was evaluated by an OSH; tells [place] she left AMA and was advised to come to [facility]. (No records available; unable to locate.)    She tells me that she's been under unusually high stress lately and consequently has had a higher frequency of posterior neck pain and migraine headaches, however the quality and character of these pains were not unusual in any way. She had a migraine headache the day prior to symptoms, but this headache was also consistent with prior headaches. Typically, she takes ASA and acetaminophen for headache pain.    She has never experienced any focal neurological symptoms at any point in her past, and for the past several weeks has noted no symptoms that are unusual for her. No recent trauma, falls, high velocity neck movement or unusual neck pain. In particular, no recent or distant history of anterior neck pain or focal neck pain.    We briefly reviewed her family  history; no first-degree relatives with history of neurovascular or cardiovascular history.     SOCIAL HISTORY:   T/E/D:  active tobacco use (1/2ppd since teenage years). Does not drink. Occas THC. No other substances.  Occupation: works with "lots of animals"  Lives with: boyfriend at rented property, tells me she feels safe at home    SUBJECTIVE   - Anxious, a bit hypermotor. Tells me that she was able to get a bit of sleep, has re-considered leaving AMA so that we can complete our evaulation.    MEDICATIONS   Scheduled:  Aspirin Chewable Tablet 81 mg, ORAL, QAM  Clopidogrel (PLAVIX) Tablet 75 mg, ORAL, QAM  Lorazepam (ATIVAN) Tablet 1 mg, ORAL, PRE-MED  Nicotine (NICODERM CQ) 14 mg/24 hr Patch 1 patch, Transdermal, Q24H Now  Polyethylene Glycol 3350 (MIRALAX) Oral Powder Packet 17 g, ORAL, Daily 1000   Or  Polyethylene Glycol 3350 (MIRALAX) Oral Powder Packet 17 g, NG, Daily 1000   Or  Polyethylene Glycol 3350 (MIRALAX) Oral Powder Packet 17 g, GT, Daily 1000  IV Fluids & Drips:     PRN:  Acetaminophen (TYLENOL) Tablet 650 mg, ORAL, Q4H PRN   Or  Acetaminophen (TYLENOL) 650 mg/20.3 mL Solution 650 mg, ORAL, Q4H PRN   Or  Acetaminophen (TYLENOL) 650 mg/20.3 mL Solution 650 mg, NG, Q4H PRN   Or  Acetaminophen (TYLENOL) Suppository 650 mg, RECTALLY, Q4H PRN  Bisacodyl (DULCOLAX) Suppository 10 mg, RECTALLY, Q24H PRN  Magnesium Hydroxide (MILK OF MAGNESIA) 400 mg/5 mL Suspension 30 mL, ORAL, Q12H PRN   Or  Magnesium Hydroxide (MILK OF MAGNESIA) 400 mg/5 mL Suspension 30 mL, NG, Q12H PRN  Nicotine (NICORETTE) Lozenge 1-2 lozenge, ORAL, PRN   Or  Nicotine (NICORETTE) Gum 2-4 mg, ORAL, PRN  Potassium Chloride (KLOR-CON M20) SR Tablet 40-60 mEq, ORAL, PRN        OBJECTIVE:   VITALS:     Current  Minimum Maximum   BP BP: (!) 145/88 BP: (144-180)/(82-127)    Temp Temp: 36.3 C (97.3 F)  Temp Min: 36 C (96.8 F)  Temp Max: 36.7 C (98 F)    Pulse Pulse: 82 Pulse Min: 74  Pulse Max: 87    Resp Resp: 16 Resp Min: 16   Resp Max: 20    O2 Sat SpO2: 97 % SpO2 Min: 96 % SpO2 Max: 99 %   O2 Deliv Room air     INTAKE/OUTPUT:  Last Two Completed Shifts  In: 120 [Oral:120]  Out: -     Current Shift  In: 240 [Oral:240]  Out: -     EXAM  GCS  Best Eye Response: 4-->(E4) spontaneous, Best Verbal Response: 5-->(V5) oriented, Best Motor Response: 6-->(M6) obeys commands, Glasgow Coma Scale Score: 15    NIH Stroke Scale:  1a. Level of Consciousness: 0-->Alert, keenly responsive  1b. LOC Questions: 0-->Answers both questions correctly  1c. LOC Commands: 0-->Performs both tasks correctly  2. Best Gaze: 0-->Normal  3. Visual: 1-->Partial hemianopia  4. Facial Palsy: 0-->Normal symmetrical movements  5a. Motor Arm, Left: 0-->No drift, limb holds 90 (or 45) degrees for full 10 secs  5b. Motor Arm, Right: 0-->No drift, limb holds 90 (or 45) degrees for full 10 secs  6a. Motor Leg, Left: 0-->No drift, leg holds 30 degree position for full 5 secs  6b. Motor Leg, Right: 0-->No drift, leg holds 30 degree position for full 5 secs  7. Limb Ataxia: 0-->Absent  8. Sensory: 0-->Normal, no sensory loss  9. Best Language: 0-->No aphasia, normal  10. Dysarthria: 0-->Normal  11. Extinction and Inattention (formerly Neglect): 0-->No abnormality  Total (NIH Stroke Scale): 1    General:  NAD, resting comfortably in bed  HEENT: NC/AT, anicteric sclera, mucosa pink/moist  Lungs: Normal rate, rhythm, and effort. CTA throughout.  Heart: rrr, no m/r/g  Ext: No clubbing/cyanosis; no LE edema  Skin: No rashes forearms, lower legs, or face    Mental Status:   State:  Alert, oriented to self, month/year and situation. Pleasant. Mildly tremulous, pressured speech, appears anxious. Appropriately conversant.  Use of language:  Fluent, intact expression and comprehension (follows commands). No paraphasias, no dysarthria noted.    Cranial Nerves:  CN 1:        Smell testing deferred.  CN 2:          OD: pupil 4mm with trace to no reactivity to direct light stimulation; minimally  reactive to consensual stimulation (+RAPD). Able to discern some colors and shapes at temporal peripheral field only.  OS: pupil 4mm, round, reactive to direct light  stimulation; no consensual response. Visual fields full, able to read ID badge and small print on cell phone.  CN 3,4,6:  EOM intact without nystagmus.  CN 5:        Sensation to LT intact and symmetric at V1, V2, V3.  CN 7:        Facial symmetrical at rest and with expression.  CN 8:        Hearing intact to voice.  CN 9, 10:  Palate elevation symmetric.  CN 11:      Trapezius strength 5/5.  CN 12:      Tongue protrusion midline.    Motor:  Normal bulk and tone, no adventitious movements. No pronator drift.     Deltoid Biceps Triceps Wrist Flexors Wrist Extensors Finger Flexors Finger Extensors Finger Abductors   R 5 5 5 5 5 5 5 5    L 5 5 5 5 5 5 5 5           Hip Flexors Hip Extensors  Knee Flexors Knee Ext Dorsi- Flexion Plantar- Flexion   R 5 5 5 5 5 5    L 5 5 5 5 5 5      Reflexes:   Biceps-C5 BR-C6 Patellar-L4   R 2+ 2+ 2+   L 2+ 2+ 2+     Sensory:  Light touch:  Intact, symmetric throughout bilateral upper and lower extremities.      Coordination:  F-to-N:  Intact, no ataxia or dysmetria    H-to-S:  Intact, no ataxia or dysmetria  Rapid-alt movements: No bradykinesia fingertapping.    Cortical:  Extinction on DSS (neglect):  No extinction with simultaneous tactile stimulation    LAB TESTS/STUDIES     CBC  Recent labs for the past 72 hours     08/29/21  2214 08/30/21  0147 08/30/21  0750   WBC 8.6 8.4 8.4   HGB 14.4 14.5 16.2*   HCT 42.5 43.5 48.4*   PLT 252 237 275   MCV 88.2 88.6 89.2   MCH 29.8 29.5 29.8   MCHC 33.8 33.3 33.4   RDW 13.9 13.9 13.9   MPV 8.6 8.4 8.7    BASIC METABOLIC PANEL  Recent labs for the past 72 hours     08/29/21  2214 08/30/21  0147   NA 136  --    K 3.8  --    CL 103  --    BUN 10  --    CR 0.86  --    GLU 86  --    CA 9.0 8.9      COAGs  Recent labs for the past 144 hours     08/30/21  0147   INR 1.08   APTT 32.0   PT  9.9    HEPATIC FUNCTION PANEL  No results for input(s): AST, ALT, ALP, GGT, TBIL, BILID, ALB, TP in the last 96 hours.   DIABETES & LIPIDS PANEL  Recent labs for the past 144 hours     08/29/21  2214   CHOL 148   LDLC 74   LDLCDIRECT 80   HDL 47   TRIG 137    No results for input(s): UACOLLECTION, UACOLOR, UACLARITY, SGURINE, UAPH, UAOCCULT, UABIL, UAKET, UAGLU, UAPRO, UAURO, UANIT, UALEUK, MICROSCOPIC, UAMWBC, UAMRBC, BACTERIA, MUCOUS, UCULTIND in the last 96 hours.      Latest Reference Range & Units 08/29/21 22:14   Ethanol, Plasma <10 mg/dL 13 (H)   (H): Data is abnormally high  08/30/21 01:47   WBC Morphology Toxic Vacuoles Seen     Micro:  Micro Results (sorted by collection)       No results found for the last 72 hours.          RADIOGRAPHIC STUDIES     CT ANGIO HEAD 08/29/2021 9:47 PM   1.   No evidence for large vessel occlusion.   2.   No evidence for stenosis, dissection, aneurysm or vascular malformation.     CT Cerebral Perfusion With Contrast 08/29/2021 9:47 PM   No acute infarct or ischemia.     CT ANGIO NECK 08/29/2021 9:47 PM   No stenosis or occlusion.     TTE with bubble 08/30/2021  SUMMARY:    1. There is no evidence of pericardial effusion.    2. Left ventricular diastolic function is abnormal. Stage I: Impaired early left ventricular   relaxation (Abnormal relaxation pattern).    3. Mild concentric left ventricular hypertrophy.    4. Normal right ventricular size, wall thickness, and systolic function.    5. The left atrium is normal in size and structure.    6. The right atrium is normal in size and structure.    7. The inferior vena cava is dilated.    8. Saline contrast bubble study was negative, with no evidence of any intracardiac shunt.    9. Although bubble study was negative, image quality is poor which limits sensitivity of this test.     ASSESSMENT and PLAN     Charlotte [name] is a 70yr RH F with stated PMH of migraine without aura and tobacco abuse with more distant history of  depression and anxiety ("I tried all the medications", no current prescriptions) who presented to OSH 08/29/2021 after waking up with painless OD vision loss. She reports leaving AMA and was advised to come to [facility] for further evaluation. Upon initial neurologic evaluation at [facility], NIH 1 (partial vision loss). Exam notable for elevated blood pressures (SBP 180s), central > peripheral right eye vision loss. CTH did not reveal a bleed or LVO. Carotid arteries appear patent both extra and intracranially.    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 Embolic etiology less likely, however she is pending MRI brain to evaluate for any other evidence of chronic or acute ischemia. TCD with bubble and spontaneous emboli detection preliminary read is negative for HITS. TTE without evidence of thrombus or PFO/VSD. Due to her young age, antiphospholipid hypercoagulable panel studies sent, which will result following discharge. There is no evidence of atherosclerotic disease or carotid dissection on CTA head/neck to suggest an artery-artery embolus as possible source. No CBC or constitutional findings suggestive of a liquid oncological process.    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    TOC emailed to assist patient with establishing with PCP in her area. Additionally, patient will need to follow up with ophthalmology (retinal specialist) and stroke neurology as an outpatient.    # R Monocular vision loss, c/w CRAO   Etiology: suspect small vessel (HTN, life-long smoker, substance use)    Diagnostic:  - Utox positive for amphetamines, EtOH borderline positive  - ECG grossly normal  - Telemetry monitoring while inpatient  - TTE with bubble - mild LVH, no shunt within technical limits  -  TCD with spontaneous embo and bubble - negative  - CTA Head and neck without evidence of dissection, stenosis or occlusion  - MRI Brain and orbits - pending  - Appreciate ophthamology evaluation - findings consistent with CRAO    - Antiphospholipid hypercoagulable studies: (will result after discharge)   - dRVVT    - Anticardiolipin IgG/IgM   - Beta-2 glycoprotein 1 IgG/IgM    Therapeutic  - Smoking cessation counseling provided  - SUN consultation  - HTN management per below    Secondary Stroke Prevention  - Long term blood pressure goal 110-130/60-80  - Statin therapy deferred due to LDL 80 and no evidence of intracranial or great vessel atherosclerotic disease.  - HgbA1c goal < 7%: Current: pending  - Recommend daily ASA 81mg  (life-long)  - Followup with Stroke Clinic in 3-4 months.  - Ziopatch at discharge (ordered)    # Hypertension, suspect essential  - START Losartan 25mg  daily  - Follow up with PCP   - Email sent to [facility] to assist with establishing care    # Tobacco and stimulant abuse  Felt to be primary contributor to CRAO  - Smoking cessation counseling  - Nicotine patch/gum/lozenge PRN  - SUN consult placed    HOSPITAL BUNDLE   FEN: regular diet; replete electrolytes prn  Code status: Full  DVT prophylaxis: SQH if not discharged today  Bowel care: Last Bowel Movement: (not recorded)  Activity restrictions: as tolerated  Dispo: pending treatment and workup  =====================================================================    Patient was seen and discussed with Vascular Neurologist Attending. Further recommendations may follow in addendum.     Report Electronically Signed By:  , NP  AGACNP Student    I have been assigned to this patient, along with a  NP Student  and have reviewed and agree with the  NP Student  documentation except as otherwise noted/documented.    [facility], MSN, AGACNP-BC, ANVP-BC, SCRN  #1 Secure Chat/ Pgr: [id number]  Hours: [phone]; After Hours pgr: [id number]          This patient was seen, evaluated, and care plan was developed with the APP.  I agree with the assessment and plan as outlined in the APP's note with these additions and clarifications: Please refer to the initial consultation for my assessment and plan from today..  Report electronically signed by Vernia Buff, DO. Attending '

## 2021-08-31 LAB — CULTURE URINE, BACTI: URINE CULTURE: NO GROWTH

## 2021-08-31 LAB — CULTURE SURVEILLANCE, MRSA

## 2021-09-01 LAB — BETA-2 GLYCOPROTEIN 1 AB IGG
Beta 2 GP1 Ab IgG Value: <1.4 U/mL (ref <20.0)
Beta 2 GP1 Ab IgG: NEGATIVE GUNITS

## 2021-09-01 LAB — ANTICARDIOLIPIN ANTIBODY IGG
Cardiolipin IgG: NEGATIVE
GPL-U/ML: <1.6 GPL-U/mL (ref <20.0)

## 2021-09-01 LAB — ANTICARDIOLIPIN ANTIBODY IGM
Cardiolipin IgM: NEGATIVE
MPL-U/ML: 2.2 MPL-U/mL (ref <20.0)

## 2021-09-01 LAB — BETA-2 GLYCOPROTEIN 1 AB IGM
Beta 2 GP1 Ab IgM Value: 2.7 U/mL (ref <20.0)
Beta 2 GP1 Ab IgM: NEGATIVE MUNITS

## 2021-09-02 LAB — DRVVT PANEL: Viper Venom Time: 30.1 secs

## 2021-09-04 LAB — ELECTROCARDIOGRAM WITH RHYTHM STRIP: QTC: 469

## 2021-09-07 ENCOUNTER — Encounter: Payer: Self-pay | Admitting: Gastroenterology

## 2021-09-07 ENCOUNTER — Ambulatory Visit: Payer: Medicare Other | Admitting: Gastroenterology

## 2021-09-07 VITALS — BP 122/78 | HR 95 | Ht 62.0 in | Wt 234.0 lb

## 2021-09-07 DIAGNOSIS — K449 Diaphragmatic hernia without obstruction or gangrene: Secondary | ICD-10-CM

## 2021-09-07 DIAGNOSIS — K219 Gastro-esophageal reflux disease without esophagitis: Secondary | ICD-10-CM

## 2021-09-07 NOTE — Patient Instructions (Signed)
If you are age 44 or older, your body mass index should be between 23-30. Your Body mass index is 42.8 kg/m. If this is out of the aforementioned range listed, please consider follow up with your Primary Care Provider.  If you are age 74 or younger, your body mass index should be between 19-25. Your Body mass index is 42.8 kg/m. If this is out of the aformentioned range listed, please consider follow up with your Primary Care Provider.   ________________________________________________________  The [facility] providers would like to encourage you to use [facility] to communicate with providers for non-urgent requests or questions.  Due to long hold times on the telephone, sending your provider a message by [facility] may be a faster and more efficient way to get a response.  Please allow 48 business hours for a response.  Please remember that this is for non-urgent requests.  _______________________________________________________  Continue Protonix.  Call in 1 year to schedule a colonoscopy. If you are having other problems please let us know.  Thank you,  Dr. Lynann Bologna

## 2021-09-07 NOTE — Progress Notes (Signed)
Chief Complaint: FU  Referring Provider:  Dr Jeanie Sewer      ASSESSMENT AND PLAN;   #1. 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  No Barrett's on biopsies.    Plan: -Continue Protonix 40mg  po bid #180, 4 refills.  Can try to reduce it to once a day.  Explained long term risks and benefits. -Nonpharmacologic means of reflux control was stressed.  I encouraged her to continue to reduce weight. Avoid fatty foods. -Colon at age 46 for CRC screening. -FU in 1 year.  Earlier, if with any new problems. -She will make appointment with Dr. 54 for routine annual physical.   HPI:    Traci [name] is a 46 y.o. female  For follow-up visit Here for protonix 40mg  po BID. Occ breakthru. No odynophagia or dysphagia Has been trying to reduce weight. She does not eat late at night. She avoids spicy foods. Has cut down and almost stopped drinking sodas.  No nonsteroidals. Doing extremely well  Here for medication refill.  Occ diarrhea - intermittent x yrs with occ constipation. Drinks plenty of water.   Wt Readings from Last 3 Encounters:  09/07/21 234 lb (106.1 kg)  12/15/20 231 lb (104.8 kg)  08/28/20 225 lb (102.1 kg)      Past Medical History:  Diagnosis Date   COVID 2021   also had in 2020   Family history of adverse reaction to anesthesia    Father had nausea post anesthesia   GERD (gastroesophageal reflux disease)    Headache    History of hiatal hernia    History of kidney stones    passed stones-no surgery   IBS (irritable bowel syndrome)    Interstitial cystitis    Osgood-Schlatter's disease    "diagnosed years ago"   Pneumonia 2014   Seizures (HCC)    last one 02/2018 - on new medication    Past Surgical History:  Procedure Laterality Date   APPENDECTOMY     CESAREAN SECTION     x 2   CHOLECYSTECTOMY     COLONOSCOPY     normal-2010   DILATION AND CURETTAGE OF UTERUS  2008   KNEE SURGERY Right    x3   LAPAROSCOPY     endometriosis   lipotripsy      x 1 - kidney stone removal with stent   TUBAL LIGATION     UPPER GASTROINTESTINAL ENDOSCOPY  12/26/2014   Erosive esophagitis (LA grade B). Small hiatal hernoa. Mild gastritis   VAGUS NERVE STIMULATOR INSERTION N/A 07/04/2014   Procedure: VAGAL NERVE STIMULATOR IMPLANT;  Surgeon: 12/28/2014, MD;  Location: [facility];  Service: Neurosurgery;  Laterality: N/A;   VAGUS NERVE STIMULATOR INSERTION N/A 08/28/2020   Procedure: VAGUS NERVE STIMULATOR BATTERY REPLACEMENT;  Surgeon: Lisbeth Renshaw, MD;  Location: [facility] OR;  Service: Neurosurgery;  Laterality: N/A;    Family History  Problem Relation Age of Onset   Cancer Mother    Diabetes Father    Cancer Maternal Grandmother    Cancer Maternal Grandfather    Diabetes Paternal Grandmother    Diabetes Paternal Grandfather    Migraines Neg Hx    Colon cancer Neg Hx    Rectal cancer Neg Hx    Stomach cancer Neg Hx    Esophageal cancer Neg Hx     Social History   Tobacco Use   Smoking status: Former    Types: Cigarettes    Quit date: 1999  Years since quitting: 24.5   Smokeless tobacco: Never  Vaping Use   Vaping Use: Never used  Substance Use Topics   Alcohol use: Yes    Comment: social   Drug use: No    Current Outpatient Medications  Medication Sig Dispense Refill   lamoTRIgine (LAMICTAL) 150 MG tablet Take 1 tablet (150 mg total) by mouth 2 (two) times daily. 180 tablet 3   pantoprazole (PROTONIX) 40 MG tablet Take 1 tablet (40 mg total) by mouth 2 (two) times daily. TAKE 1 TABLET(40 MG) BY MOUTH TWICE DAILY BEFORE A MEAL. 180 tablet 1   promethazine (PHENERGAN) 25 MG tablet Take 1 tablet (25 mg total) by mouth every 8 (eight) hours as needed for nausea or vomiting. 30 tablet 5   topiramate (TOPAMAX) 100 MG tablet Take 0.5 tablets (50 mg total) by mouth in the morning and at bedtime. 180 tablet 3   No current facility-administered medications for this visit.    No Known Allergies  Review of Systems:  neg      Physical Exam:    BP 122/78   Pulse 95   Ht 5\' 2"  (1.575 m)   Wt 234 lb (106.1 kg)   SpO2 99%   BMI 42.80 kg/m  Wt Readings from Last 3 Encounters:  09/07/21 234 lb (106.1 kg)  12/15/20 231 lb (104.8 kg)  08/28/20 225 lb (102.1 kg)   Constitutional:  Well-developed, in no acute distress. Psychiatric: Normal mood and affect. Behavior is normal. HEENT: Pupils normal.  Conjunctivae are normal. No scleral icterus. Abdominal: Soft, nondistended. Nontender. Bowel sounds active throughout. There are no masses palpable. No hepatomegaly. Rectal:  defered Neurological: Alert and oriented to person place and time. Skin: Skin is warm and dry. No rashes noted.  Data Reviewed: I have personally reviewed following labs and imaging studies  CBC:    Latest Ref Rng & Units 08/26/2020    8:41 AM 07/09/2019    9:01 AM 01/08/2019    9:38 AM  CBC  WBC 4.0 - 10.5 K/uL 6.4  6.7  7.2   Hemoglobin 12.0 - 15.0 g/dL 13/11/2018  27.7  82.4   Hematocrit 36.0 - 46.0 % 43.3  43.8  41.8   Platelets 150 - 400 K/uL 321  320  304     CMP:    Latest Ref Rng & Units 07/15/2020    9:17 AM 07/09/2019    9:01 AM 01/08/2019    9:38 AM  CMP  Glucose 65 - 99 mg/dL 92  92  92   BUN 6 - 24 mg/dL 16  23  11    Creatinine 0.57 - 1.00 mg/dL 13/11/2018   3.61   Sodium 134 - 144 mmol/L 144  140  138   Potassium 3.5 - 5.2 mmol/L 4.3  3.9  4.1   Chloride 96 - 106 mmol/L 108  105  106   CO2 20 - 29 mmol/L 20  21  19    Calcium 8.7 - 10.2 mg/dL 9.4  9.3  9.3   Total Protein 6.0 - 8.5 g/dL 7.0  6.7  6.7   Total Bilirubin 0.0 - 1.2 mg/dL 0.3  0.3  0.4   Alkaline Phos 44 - 121 IU/L 91  101  72   AST 0 - 40 IU/L 15  14  19    ALT 0 - 32 IU/L 16  12  25        4.43, MD 09/07/2021, 8:44 AM  Cc: Dr. 

## 2021-10-05 ENCOUNTER — Other Ambulatory Visit: Payer: Self-pay

## 2021-10-08 ENCOUNTER — Other Ambulatory Visit: Payer: Self-pay

## 2021-11-02 LAB — PATHOLOGIST REVIEW, COAG

## 2021-12-15 ENCOUNTER — Encounter: Payer: Self-pay | Admitting: Neurology

## 2021-12-15 ENCOUNTER — Telehealth: Payer: Self-pay | Admitting: Neurology

## 2021-12-15 ENCOUNTER — Ambulatory Visit: Payer: Medicare Other | Admitting: Neurology

## 2021-12-15 VITALS — BP 128/89 | HR 86 | Ht 62.0 in | Wt 239.5 lb

## 2021-12-15 DIAGNOSIS — Z9689 Presence of other specified functional implants: Secondary | ICD-10-CM | POA: Diagnosis not present

## 2021-12-15 DIAGNOSIS — G40509 Epileptic seizures related to external causes, not intractable, without status epilepticus: Secondary | ICD-10-CM | POA: Diagnosis not present

## 2021-12-15 DIAGNOSIS — G40B09 Juvenile myoclonic epilepsy, not intractable, without status epilepticus: Secondary | ICD-10-CM | POA: Diagnosis not present

## 2021-12-15 MED ORDER — UBRELVY 100 MG PO TABS: 1.0000 | ORAL_TABLET | ORAL | 0 refills | Status: DC | PRN

## 2021-12-15 MED ORDER — LAMOTRIGINE 150 MG PO TABS: 150.0000 mg | ORAL_TABLET | Freq: Two times a day (BID) | ORAL | 3 refills | Status: DC

## 2021-12-15 MED ORDER — UBRELVY 100 MG PO TABS: 100.0000 mg | ORAL_TABLET | ORAL | 0 refills | Status: DC | PRN

## 2021-12-15 MED ORDER — TOPIRAMATE 100 MG PO TABS: 50.0000 mg | ORAL_TABLET | Freq: Two times a day (BID) | ORAL | 3 refills | Status: DC

## 2021-12-15 NOTE — Addendum Note (Signed)
Addended by: Larey Seat on: 12/15/2021 10:19 AM   Modules accepted: Orders

## 2021-12-15 NOTE — Telephone Encounter (Signed)
At the visit today. Pt will receive samples.

## 2021-12-15 NOTE — Patient Instructions (Signed)
  Epilepsy Epilepsy is when a person keeps having seizures. A seizure is a burst of abnormal activity in the brain. This condition can cause problems such as: A change in how you think or behave. Trouble knowing what is happening. Falls, accidents, and injury. Sadness (depression). Poor memory. In rare cases, this condition can be life-threatening. But most people with epilepsy lead normal lives. What are the causes? A head injury or an injury that happens at birth. A high fever during childhood. A stroke. Bleeding into or around the brain. Some medicines and drugs. Having too little oxygen for a long time. Abnormal brain development. Conditions such as: Brain infection. Brain tumors. Conditions that are passed from parent to child. Many times, the cause is not known. What are the signs or symptoms? Symptoms of a seizure vary from person to person. They may include: Symptoms during a seizure Shaking with fast, jerky movements of muscles (convulsions). Stiffness of the body. Breathing problems. Being mixed up (confused). Staring or being hard to wake up (being unresponsive). Head nodding, eye blinking, eye twitching, or fast eye movements. Drooling, grunting, or making clicking sounds with your mouth. Not being able to control when you pee or poop. Symptoms before a seizure Feeling afraid, worried, or nervous. Feeling like you may vomit. Vertigo. This feels like: You are moving when you are not. Things around you are moving when they are not. Dj vu. This is a feeling of having seen or heard something before. Odd tastes or smells. Changes in how you see, such as seeing flashing lights or spots. Symptoms after a seizure Being confused. Being sleepy. A headache. Sore muscles. How is this treated? Treatment can control seizures. It may include: Taking medicines. Having a device put in the chest (vagus nerve stimulator). Brain surgery. Having blood tests  often. Eating foods that are low in carbohydrates and high in fat (ketogenic diet). If you are diagnosed with this condition, you should start treatment as soon as you can. Follow these instructions at home: Medicines Take over-the-counter and prescription medicines only as told by your doctor. Avoid anything that may keep your medicine from working, such as alcohol. Activity Get enough rest. Follow your doctor's advice about driving, swimming, and doing other things that would be dangerous if you had a seizure. If you live in the [place] ask your local department of motor vehicles about local driving laws for people with epilepsy. Teaching others  Teach friends and family what to do if you have a seizure. Tell them to: Help you get down to the ground. Put a pillow under your head and body. Loosen any clothing around your neck. Turn you on your side. Stay with you until you are better. Know whether or not you need emergency care. Also, tell them what not to do if you have a seizure. Tell them: They should not hold you down. They should not put anything in your mouth. General instructions Avoid things that cause you to have seizures. Keep a seizure diary. Write down: What you remember about each seizure. What might have caused the seizure. Keep all follow-up visits. Where to find more information Epilepsy Foundation: epilepsy.com International League Against Epilepsy: ilae.org Contact a doctor if: You have a change in how often or when you have seizures. You get an infection or start to feel sick. You are not able to take your medicine. Get help right away if: A seizure does not stop after 5 minutes. You have more than one seizure   in a row, and you do not have enough time between the seizures to feel better. A seizure makes it harder to breathe. A seizure is different from other seizures you have had. A seizure makes you unable to speak or use a part of your body. You did not  wake up right away after a seizure. You feel sad, and this does not get better. These symptoms may be an emergency. Get help right away. Call your local emergency services (911 in the [place] Do not wait to see if the symptoms will go away. Do not drive yourself to the hospital. Get help right awayif you feel like you may hurt yourself or others, or have thoughts about taking your own life. Go to your nearest emergency room or: Call your local emergency services (911 in the [place] Call the National Suicide Prevention Lifeline at 1-800-273-8255 or 988 in the [place] This is open 24 hours a day. Text the Crisis Text Line at 741741. Summary Epilepsy is when a person keeps having seizures. Seizures can cause many symptoms, such as brief staring and shaking or jerky muscle movements. Treatment can control seizures. Take over-the-counter and prescription medicines only as told by your doctor. Follow your doctor's advice about driving, swimming, and doing other things that would be dangerous if you had a seizure. Teach friends and family what to do if you have a seizure. This information is not intended to replace advice given to you by your health care provider. Make sure you discuss any questions you have with your health care provider. Document Revised: 09/09/2020 Document Reviewed: 08/19/2019 Elsevier Patient Education  2023 Elsevier Inc.  

## 2021-12-15 NOTE — Progress Notes (Addendum)
PATIENT: Traci [name] DOB: 1975-09-03  REASON FOR VISIT: follow up q 6 month.  HISTORY FROM: patient  INTERVAL HISTORY : monthly seizures, around menstrual period. JME patient   December 15, 2021.  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  She has used in device requested she.avs Is a daily Magnet stimulation, average stimulations per day is 1 Magnet stimulation 130 auto stimulations and the normal setting of the device itself allows for another 317 stimulations per day so the device is stimulated 448 times per day.  Output status is 1.625 mA, lead impedance is 2500 ohm and the generator battery is over 75% full.  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  She has reported some headaches not every day but these may also be related to nights when she does not get a whole lot of hours of sleep or when she could not fall asleep.  She reports that her nights are sometimes undesirable short and that she cannot go back to sleep after an arousal no matter what the cause of the arousal may be.  She estimates 4 to 6 hours average, she denies snoring or apneas being witnessed by her husband.  06-15-2021:  No seizures in 1.5 months- none last month but some headaches , we discussed reduction in tachycardia detection- from 30 to 20 %. The threshold is reset every 5 minutes and averaged- should not affect exercise capability.  She swipes magnet, battery power is between 75 and 100%. We did this one adjustment today . [id number].     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 We referred  this established VNS patient to Neurosurgeon for new battery - VNS was running 11-23%. New Battery was placed in July 2022.    25.22 ohms lead impedence.   She Continues to swipe magnet for VNS: Now uses magnet every day and has had  the " expected " menstrual cycle related seizures , but short,   Prior to and during her menstrual cycle- I had changed tachycardia detection back to 40% change in heart rate- allowing for exercise induced tachycardia. She feels not that there have been any stimulations due to physical activity induced tachycardia. No false positives. The new battery and new VNS stimulation detection allows to get the more sensitive stimulation , we can drop to 30 % today.   I had increased pulse width on normal settings to 500 micro-seconds, from 250 micro sec.  We left that setting in place.   We increased a output current by 0.125 mA to Magnet ( 0.875 mA) and normal mode ( now 1.625), autostimulation at 1.75 m A.   CPT code is [id number]   - 07-15-2020: CD, patient reports an incident , seizure in late [place] 2022[place]. Did write down the scores, and suddenly was on the floor, no aura, lasted 1-2 minutes and not a long postictum- 5-8 minutes to re-orientate herself. Not related to menstrual cycle.  The first seizure in a long time.  I could hear his current settings, the output is set to 1.5 mA outer stimulation to 1.625 mA and the magnet strength to 1.75 mA the frequency is 20 Hz.  Pulse width is 500 s for the auto stimulation 20 to 50 s for the magnet 250 s.  On time 30 seconds  for the normal baseline auto stimulation 60 seconds Magnet 60 seconds.  Of time is 3 minutes duty cycle is 16%.  Battery at this time is over 25%.  Lead impedance is 2516 home.  Output current 1.5 mA escorted above.  There were no magnet stimulations today.  Auto stim events 20.47/day. We discussed regular magnet use. She has been reset to 40 % heart rate increase threshold. all functions were screened and the patient VNS has only 11-255 of battery power. I would like to set her up for replacement in the next 6-8 month.      04/14/2020 with N[facility] [place], adjusted VNS settings.   01/09/20: CD Here with husband, rm 14.  presents today for follow up. She reports  overall been doing well. 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   She has been  Working out again, helps mood. Lost 40 pounds.  she states average month she usually only has 1 seizure, maybe 2. The month of november she has had 3, but states that she has also had a change in her menstrual cycle so she feels they may be hormonal. VNS interrogation. Last seizure described at 20-30 sec duration , just this week. Changes in menstrual cycle- usually very regularly, it was 21 days late and now it is only 2 days long. No spotting. More headaches. More depression-  Lamictal as raised last visit to 150 mg bid by Ward Givens, NP and continues Topamax 50 mg bid. VNS patient.  Here for interrogation.  The venous stimulator was implanted on 03 Jul 2014 is a expected battery life of a decade.  Her current settings are output current of 1.5 mA signal frequency of 20 Hz pulse width of 250 s signal on time duration is 30 seconds and signal of time is 3 minutes between signals.   The measurement has remained at an output current of 1.75 mA pulse width of 250 signal on time of 60 seconds.  Also stimulation is set to an output current of 1.6 to 5 mA pulse width of 250 s signal on time of 1 minute.  And tachycardia detection is 1 with a heart rate sensitivity level of 3 threshold for auto stimulation is a change in heart rate by 40%.      Traci [name] is a 46 year old female with a history of intractable seizures.  She returns today for follow-up.  She is currently on Lamictal 100 mg in the morning and 150 in the evening.  She remains on Topamax 50 mg twice a day.  She also has a vagal nerve stimulator.  Reports that she swiped her magnet 3 times a day.  She continues to have seizures around her menstrual cycle.  On average she has approximately 1-2 seizures a month.  Reports that the most she has had is four.  She does not operate a motor vehicle.  She returns today for an  evaluation  HISTORY 01/08/19:   Traci [name] is a 46 year old female with a history of seizures.  She returns today for follow-up.  She is currently on Lamictal 100 mg in the morning and 150 mg in the evening.  She is also on Topamax 50 mg twice a day.  She also has a vagal nerve stimulator.  She reports that she always has seizures around her menstrual cycle.  She states that typically 7 days before she will start feeling different.   And then she may have 3 days during her cycle that she has  events.  These are typically grand mal seizures.  She states the length of the seizure can vary from 30 secs to 2 min. She reports that she is tolerating her medication well.  She does not operate a motor vehicle.  She is able to complete all ADLs independently.  She returns today for an evaluation   REVIEW OF SYSTEMS: Out of a complete 14 system review of symptoms, the patient complains only of the following symptoms, and all other reviewed systems are negative.  See HPI-   How likely are you to doze in the following situations: 0 = not likely, 1 = slight chance, 2 = moderate chance, 3 = high chance  Sitting and Reading? Watching Television? Sitting inactive in a public place (theater or meeting)? Lying down in the afternoon when circumstances permit? Sitting and talking to someone? Sitting quietly after lunch without alcohol? In a car, while stopped for a few minutes in traffic? As a passenger in a car for an hour without a break?  Total = 9/ 24  FSS at 35/ 63 points    ALLERGIES: No Known Allergies    HOME MEDICATIONS: Outpatient Medications Prior to Visit  Medication Sig Dispense Refill   lamoTRIgine (LAMICTAL) 150 MG tablet Take 1 tablet (150 mg total) by mouth 2 (two) times daily. 180 tablet 3   pantoprazole (PROTONIX) 40 MG tablet Take 1 tablet (40 mg total) by mouth 2 (two) times daily. TAKE 1 TABLET(40 MG) BY MOUTH TWICE DAILY BEFORE A MEAL. 180 tablet 1   promethazine (PHENERGAN)  25 MG tablet Take 1 tablet (25 mg total) by mouth every 8 (eight) hours as needed for nausea or vomiting. 30 tablet 5   topiramate (TOPAMAX) 100 MG tablet Take 0.5 tablets (50 mg total) by mouth in the morning and at bedtime. 180 tablet 3   No facility-administered medications prior to visit.    PAST MEDICAL HISTORY: Past Medical History:  Diagnosis Date   COVID 2021   also had in 2020   Family history of adverse reaction to anesthesia    Father had nausea post anesthesia   GERD (gastroesophageal reflux disease)    Headache    History of hiatal hernia    History of kidney stones    passed stones-no surgery   IBS (irritable bowel syndrome)    Interstitial cystitis    Osgood-Schlatter's disease    "diagnosed years ago"   Pneumonia 2014   Seizures ([employer])    last one 02/2018 - on new medication    PAST SURGICAL HISTORY: Past Surgical History:  Procedure Laterality Date   APPENDECTOMY     CESAREAN SECTION     x 2   CHOLECYSTECTOMY     COLONOSCOPY     normal-2010   DILATION AND CURETTAGE OF UTERUS  2008   KNEE SURGERY Right    x3   LAPAROSCOPY     endometriosis   lipotripsy     x 1 - kidney stone removal with stent   TUBAL LIGATION     UPPER GASTROINTESTINAL ENDOSCOPY  12/26/2014   Erosive esophagitis (LA grade B). Small hiatal hernoa. Mild gastritis   VAGUS NERVE STIMULATOR INSERTION N/A 07/04/2014   Procedure: VAGAL NERVE STIMULATOR IMPLANT;  Surgeon: Consuella Lose, MD;  Location: [facility];  Service: Neurosurgery;  Laterality: N/A;   VAGUS NERVE STIMULATOR INSERTION N/A 08/28/2020   Procedure: VAGUS NERVE STIMULATOR BATTERY REPLACEMENT;  Surgeon: Consuella Lose, MD;  Location: [facility];  Service: Neurosurgery;  Laterality:  N/A;    FAMILY HISTORY: Family History  Problem Relation Age of Onset   Cancer Mother    Diabetes Father    Cancer Maternal Grandmother    Cancer Maternal Grandfather    Diabetes Paternal Grandmother    Diabetes Paternal Grandfather     Migraines Neg Hx    Colon cancer Neg Hx    Rectal cancer Neg Hx    Stomach cancer Neg Hx    Esophageal cancer Neg Hx     SOCIAL HISTORY: Social History   Socioeconomic History   Marital status: Married    Spouse name: Ryan   Number of children: 2   Years of education: Some colle   Highest education level: Not on file  Occupational History   Not on file  Tobacco Use   Smoking status: Former    Types: Cigarettes    Quit date: 1999    Years since quitting: 24.8   Smokeless tobacco: Never  Vaping Use   Vaping Use: Never used  Substance and Sexual Activity   Alcohol use: Yes    Comment: social   Drug use: No   Sexual activity: Yes    Birth control/protection: Surgical    Comment: Tubal Ligation  Other Topics Concern   Not on file  Social History Narrative   Lives at home with husband, Thurmond Butts   Daily caffeine consumption- 1 cup   Social Determinants of Health   Financial Resource Strain: Not on file  Food Insecurity: Not on file  Transportation Needs: Not on file  Physical Activity: Not on file  Stress: Not on file  Social Connections: Not on file  Intimate Partner Violence: Not on file      PHYSICAL EXAM  Generalized: Well developed, in no acute distress   Neurological examination  Mentation: Alert oriented to time, place, history taking.  Follows all commands speech and language fluent Cranial nerve : no loss of taste or smell.  Pupils were equal round reactive to light. Extraocular movements were full,  visual field were full on confrontational test.  Head turning and shoulder shrug  were normal and symmetric. Motor: full strength of all 4 extremities and grip - with symmetric motor tone throughout.  Sensory: intact to vibration and soft touch . Coordination: reveals good-nose-finger bilaterally.  Gait and station: Gait is intact.  Reflexes: Deep tendon reflexes are symmetric bilaterally.   DIAGNOSTIC DATA (LABS, IMAGING, TESTING) - I reviewed patient  records, labs, notes, testing and imaging myself where available.    ASSESSMENT AND PLAN 46 y.o. year old female  has a past medical history of GERD (gastroesophageal reflux disease), Headache,  History of kidney stones, IBS (irritable bowel syndrome), Interstitial cystitis, Depression and Seizures ([place]). here with:  1.  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 Uses the VNS magnet daily.  2.   Also having migraines after a night of poor sleep, waking up with headaches. Has a strong migriane history before VNS- will offer Ubrelvy po for prn use.  Samples provided.  3.   Short sleeper- waking up at undesirable early time. Sleeping 4-6 hours. 4.   Weight , BMI 43.8- but she is not snoring according to her husbands observation.  5.  Can't use IUD , has heavy periods, and regular. Not in perimenopause.    No settings were changed in this visit.   I spent 20 minutes of face-to-face and non-face-to-face time with patient.  This included previsit chart review, lab review, study review, order entry, electronic  health record documentation, patient education,  Rv in 6 month - NP or me for VNS recheck. Asencion Partridge Senya Hinzman, MD   12/15/2021, 10:01 AM [facility] [address], [place], [place] [address] [phone]

## 2021-12-21 NOTE — Telephone Encounter (Signed)
PA Ubrelvy submitted on CMM. Key: [id number]. Waiting on determination from [employer] Part D.

## 2021-12-21 NOTE — Telephone Encounter (Signed)
"  Request Reference Number: [id number]. UBRELVY TAB 100MG  is approved through 02/28/2023. Your patient may now fill this prescription and it will be covered."

## 2021-12-27 ENCOUNTER — Other Ambulatory Visit: Payer: Self-pay | Admitting: Neurology

## 2022-02-05 ENCOUNTER — Other Ambulatory Visit: Payer: Self-pay | Admitting: Gastroenterology

## 2022-02-15 ENCOUNTER — Telehealth: Payer: Self-pay | Admitting: Gastroenterology

## 2022-02-15 MED ORDER — PANTOPRAZOLE SODIUM 40 MG PO TBEC: 40.0000 mg | DELAYED_RELEASE_TABLET | Freq: Two times a day (BID) | ORAL | 2 refills | Status: DC

## 2022-02-15 NOTE — Telephone Encounter (Signed)
Done

## 2022-02-15 NOTE — Telephone Encounter (Signed)
Patient called to request refill on Pantoprazole 

## 2022-04-22 ENCOUNTER — Encounter: Payer: Self-pay | Admitting: Gastroenterology

## 2022-06-15 ENCOUNTER — Encounter: Payer: Self-pay | Admitting: Gastroenterology

## 2022-06-15 ENCOUNTER — Ambulatory Visit: Payer: Medicare Other | Admitting: Gastroenterology

## 2022-06-15 VITALS — BP 118/80 | HR 97 | Ht 62.0 in | Wt 243.0 lb

## 2022-06-15 DIAGNOSIS — Z1212 Encounter for screening for malignant neoplasm of rectum: Secondary | ICD-10-CM

## 2022-06-15 DIAGNOSIS — K449 Diaphragmatic hernia without obstruction or gangrene: Secondary | ICD-10-CM | POA: Diagnosis not present

## 2022-06-15 DIAGNOSIS — Z1211 Encounter for screening for malignant neoplasm of colon: Secondary | ICD-10-CM

## 2022-06-15 DIAGNOSIS — K2100 Gastro-esophageal reflux disease with esophagitis, without bleeding: Secondary | ICD-10-CM | POA: Diagnosis not present

## 2022-06-15 MED ORDER — PANTOPRAZOLE SODIUM 40 MG PO TBEC: 40.0000 mg | DELAYED_RELEASE_TABLET | Freq: Two times a day (BID) | ORAL | 4 refills | Status: DC

## 2022-06-15 NOTE — Patient Instructions (Addendum)
_______________________________________________________  If your blood pressure at your visit was 140/90 or greater, please contact your primary care physician to follow up on this.  _______________________________________________________  If you are age 45 or older, your body mass index should be between 23-30. Your Body mass index is 44.45 kg/m. If this is out of the aforementioned range listed, please consider follow up with your Primary Care Provider.  If you are age 23 or younger, your body mass index should be between 19-25. Your Body mass index is 44.45 kg/m. If this is out of the aformentioned range listed, please consider follow up with your Primary Care Provider.   ________________________________________________________  The [facility] providers would like to encourage you to use [facility] to communicate with providers for non-urgent requests or questions.  Due to long hold times on the telephone, sending your provider a message by [facility] may be a faster and more efficient way to get a response.  Please allow 48 business hours for a response.  Please remember that this is for non-urgent requests.  _______________________________________________________  Traci [name] have been scheduled for an endoscopy and colonoscopy. Please follow the written instructions given to you at your visit today. Please pick up your prep supplies at the pharmacy within the next 1-3 days. If you use inhalers (even only as needed), please bring them with you on the day of your procedure.  We have sent the following medications to your pharmacy for you to pick up at your convenience: Protonix 2 times daily can reduce to 1 daily  We have given you samples of the following medication to take: Clenpiq  Make appointment with Dr Jeanie Sewer for physical

## 2022-06-15 NOTE — Progress Notes (Signed)
Chief Complaint: FU  Referring Provider:  Dr Jeanie Sewer      ASSESSMENT AND PLAN;   #1. 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  No Barrett's on biopsies. #2. CRC sceening    Plan: -Continue Protonix  po bid #180, 4 refills.  Can try to reduce it to once a day.  Explained long term risks and benefits. -EGD/colon for CRC screening -Nonpharmacologic means of reflux control was stressed.  I encouraged her to reduce weight. Avoid fatty foods. -Appt with Dr. Jeanie Sewer for routine annual physical. She will make one -D/W pt and husband   I discussed EGD/Colonoscopy- the indications, risks, alternatives and potential complications including, but not limited to bleeding, infection, reaction to meds, damage to internal organs, cardiac and/or pulmonary problems, and perforation requiring surgery. The possibility that significant findings could be missed was explained. All ? were answered. Pt consents to proceed. HPI:    Traci [name] is a 47 y.o. female  For follow-up visit Here for EGD/colonoscopy  Tolerating Protonix 40 twice daily.  Occasional breakthrough symptoms. She denies having any odynophagia, dysphagia, melena or hematochezia. She does not eat late at night. Unfortunately she has gained weight as below.  No sodas, chocolates, chewing gums, artificial sweeteners and candy. No NSAIDs  Denies having any diarrhea or constipation.  She is also here for colorectal cancer screening by means of colonoscopy.   Wt Readings from Last 3 Encounters:  06/15/22 243 lb (110.2 kg)  12/15/21 239 lb 8 oz (108.6 kg)  09/07/21 234 lb (106.1 kg)      Past Medical History:  Diagnosis Date   COVID 2021   also had in 2020   Family history of adverse reaction to anesthesia    Father had nausea post anesthesia   GERD (gastroesophageal reflux disease)    Headache    History of hiatal hernia    History of kidney stones    passed stones-no surgery   IBS (irritable bowel syndrome)     Interstitial cystitis    Osgood-Schlatter's disease    "diagnosed years ago"   Pneumonia 2014   Seizures    last one 02/2018 - on new medication    Past Surgical History:  Procedure Laterality Date   APPENDECTOMY     CESAREAN SECTION     x 2   CHOLECYSTECTOMY     COLONOSCOPY     normal-2010   DILATION AND CURETTAGE OF UTERUS  2008   KNEE SURGERY Right    x3   LAPAROSCOPY     endometriosis   lipotripsy     x 1 - kidney stone removal with stent   TUBAL LIGATION     UPPER GASTROINTESTINAL ENDOSCOPY  12/26/2014   Erosive esophagitis (LA grade B). Small hiatal hernoa. Mild gastritis   VAGUS NERVE STIMULATOR INSERTION N/A 07/04/2014   Procedure: VAGAL NERVE STIMULATOR IMPLANT;  Surgeon: Lisbeth Renshaw, MD;  Location: [facility];  Service: Neurosurgery;  Laterality: N/A;   VAGUS NERVE STIMULATOR INSERTION N/A 08/28/2020   Procedure: VAGUS NERVE STIMULATOR BATTERY REPLACEMENT;  Surgeon: Lisbeth Renshaw, MD;  Location: [facility];  Service: Neurosurgery;  Laterality: N/A;    Family History  Problem Relation Age of Onset   Cancer Mother    Diabetes Father    Cancer Maternal Grandmother    Cancer Maternal Grandfather    Diabetes Paternal Grandmother    Diabetes Paternal Grandfather    Migraines Neg Hx    Colon cancer Neg Hx  Rectal cancer Neg Hx    Stomach cancer Neg Hx    Esophageal cancer Neg Hx     Social History   Tobacco Use   Smoking status: Former    Types: Cigarettes    Quit date: 1999    Years since quitting: 25.3   Smokeless tobacco: Never  Vaping Use   Vaping Use: Never used  Substance Use Topics   Alcohol use: Yes    Comment: social   Drug use: No    Current Outpatient Medications  Medication Sig Dispense Refill   lamoTRIgine (LAMICTAL) 150 MG tablet Take 1 tablet (150 mg total) by mouth 2 (two) times daily. 180 tablet 3   pantoprazole (PROTONIX) 40 MG tablet Take 1 tablet (40 mg total) by mouth 2 (two) times daily. TAKE 1 TABLET(40 MG) BY MOUTH  TWICE DAILY BEFORE A MEAL. 180 tablet 2   promethazine (PHENERGAN) 25 MG tablet Take 1 tablet (25 mg total) by mouth every 8 (eight) hours as needed for nausea or vomiting. 30 tablet 5   topiramate (TOPAMAX) 100 MG tablet Take 0.5 tablets (50 mg total) by mouth in the morning and at bedtime. 180 tablet 3   No current facility-administered medications for this visit.    No Known Allergies  Review of Systems:  neg     Physical Exam:    BP 118/80   Pulse 97   Ht 5\' 2"  (1.575 m)   Wt 243 lb (110.2 kg)   BMI 44.45 kg/m  Wt Readings from Last 3 Encounters:  06/15/22 243 lb (110.2 kg)  12/15/21 239 lb 8 oz (108.6 kg)  09/07/21 234 lb (106.1 kg)   Constitutional:  Well-developed, in no acute distress. Psychiatric: Normal mood and affect. Behavior is normal. HEENT: Pupils normal.  Conjunctivae are normal. No scleral icterus. Abdominal: Soft, nondistended. Nontender. Bowel sounds active throughout. There are no masses palpable. No hepatomegaly. Rectal:  defered Neurological: Alert and oriented to person place and time. Skin: Skin is warm and dry. No rashes noted.  Data Reviewed: I have personally reviewed following labs and imaging studies  CBC:    Latest Ref Rng & Units 08/26/2020    8:41 AM 07/09/2019    9:01 AM 01/08/2019    9:38 AM  CBC  WBC 4.0 - 10.5 K/uL 6.4  6.7  7.2   Hemoglobin 12.0 - 15.0 g/dL 16.1  09.6  04.5   Hematocrit 36.0 - 46.0 % 43.3  43.8  41.8   Platelets 150 - 400 K/uL 321  320  304     CMP:    Latest Ref Rng & Units 07/15/2020    9:17 AM 07/09/2019    9:01 AM 01/08/2019    9:38 AM  CMP  Glucose 65 - 99 mg/dL 92  92  92   BUN 6 - 24 mg/dL 16  23  11    Creatinine 0.57 - 1.00 mg/dL 4.09  8.11  9.14   Sodium 134 - 144 mmol/L 144  140  138   Potassium 3.5 - 5.2 mmol/L 4.3  3.9  4.1   Chloride 96 - 106 mmol/L 108  105  106   CO2 20 - 29 mmol/L 20  21  19    Calcium 8.7 - 10.2 mg/dL 9.4  9.3  9.3   Total Protein 6.0 - 8.5 g/dL 7.0  6.7  6.7   Total  Bilirubin 0.0 - 1.2 mg/dL 0.3  0.3  0.4   Alkaline Phos 44 - 121 IU/L  91  101  72   AST 0 - 40 IU/L ALT 0 - 32 IU/L Edman Circle, MD 06/15/2022, 9:44 AM  Cc: Dr. Gwendlyn Deutscher

## 2022-07-05 ENCOUNTER — Telehealth: Payer: Self-pay | Admitting: Adult Health

## 2022-07-05 NOTE — Telephone Encounter (Signed)
..   Pt understands that although there may be some limitations with this type of visit, we will take all precautions to reduce any security or privacy concerns.  Pt understands that this will be treated like an in office visit and we will file with pt's insurance, and there may be a patient responsible charge related to this service. ? ?

## 2022-07-06 ENCOUNTER — Telehealth: Payer: Medicare Other | Admitting: Adult Health

## 2022-07-06 DIAGNOSIS — G40B19 Juvenile myoclonic epilepsy, intractable, without status epilepticus: Secondary | ICD-10-CM | POA: Diagnosis not present

## 2022-07-06 NOTE — Progress Notes (Signed)
PATIENT: Traci [name] DOB: 1975/11/23  REASON FOR VISIT: follow up HISTORY FROM: patient  Virtual Visit via Video Note  I connected with Jolayne Panther on 07/06/22 at  8:30 AM EDT by a video enabled telemedicine application located remotely at [facility] and verified that I am speaking with the correct person using two identifiers who was located at their own home.   I discussed the limitations of evaluation and management by telemedicine and the availability of in person appointments. The patient expressed understanding and agreed to proceed.   PATIENT: Traci [name] DOB: 13-Mar-1975  REASON FOR VISIT: follow up HISTORY FROM: patient  HISTORY OF PRESENT ILLNESS: Today 07/06/22:  Traci [name] is a 47 y.o. 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 Returns today for follow-up.  She remains on Lamictal 150 mg twice a day as well as Topamax 50 mg twice a day.  She states that she continues to have 1-3 seizures typically around her menstrual cycle.  She reports that her seizure frequency has not increased.  At the last visit with Dr. Vickey Huger she mentioned having migraine headaches.  She states that she has approximately 1 headache a month.  Bernita Raisin has been helpful but is too expensive for her to purchase.  She does have another box of samples to use if needed     HISTORY   December 15, 2021 (Dr.Dohmeier's note)    Traci [name]. 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  She has used in device requested she.avs Is a daily Magnet stimulation, average stimulations per day is 1 Magnet stimulation 130 auto stimulations and the normal setting of the device itself allows for another 317 stimulations per day so the device is stimulated 448 times per day.  Output status is 1.625 mA, lead impedance is 2500 ohm and the generator battery is  over 75% full.  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  She has reported some headaches not every day but these may also be related to nights when she does not get a whole lot of hours of sleep or when she could not fall asleep.  She reports that her nights are sometimes undesirable short and that she cannot go back to sleep after an arousal no matter what the cause of the arousal may be.  She estimates 4 to 6 hours average, she denies snoring or apneas being witnessed by her husband.  04/14/20:   Traci [name] is a 47 year old female with a history of intractable seizures.  She returns today for follow-up.  She remains on Lamictal 150 mg twice a day and Topamax 50 mg twice a day.  At the last visit with Dr. Vickey Huger her VNS was adjusted.  She states that since that adjustment her seizure frequency has remained the same.  She has approximately 1-2 seizures a month.  Her seizures always occur around her menstrual cycle.  She continues to swipe her magnet during her menstrual cycle.  She joins me today through virtual visit  REVIEW OF SYSTEMS: Out of a complete 14 system review of symptoms, the patient complains only of the following symptoms, and all other reviewed systems are negative.  ALLERGIES: No Known Allergies  HOME MEDICATIONS: Outpatient Medications Prior to Visit  Medication Sig Dispense  Refill   lamoTRIgine (LAMICTAL) 150 MG tablet Take 1 tablet (150 mg total) by mouth 2 (two) times daily. 180 tablet 3   pantoprazole (PROTONIX) 40 MG tablet Take 1 tablet (40 mg total) by mouth 2 (two) times daily. TAKE 1 TABLET(40 MG) BY MOUTH TWICE DAILY BEFORE A MEAL. 180 tablet 4   promethazine (PHENERGAN) 25 MG tablet Take 1 tablet (25 mg total) by mouth every 8 (eight) hours as needed for nausea or vomiting. 30 tablet 5   topiramate (TOPAMAX) 100 MG tablet Take 0.5 tablets (50 mg total) by mouth in  the morning and at bedtime. 180 tablet 3   No facility-administered medications prior to visit.    PAST MEDICAL HISTORY: Past Medical History:  Diagnosis Date   COVID 2021   also had in 2020   Family history of adverse reaction to anesthesia    Father had nausea post anesthesia   GERD (gastroesophageal reflux disease)    Headache    History of hiatal hernia    History of kidney stones    passed stones-no surgery   IBS (irritable bowel syndrome)    Interstitial cystitis    Osgood-Schlatter's disease    "diagnosed years ago"   Pneumonia 2014   Seizures (HCC)    last one 02/2018 - on new medication    PAST SURGICAL HISTORY: Past Surgical History:  Procedure Laterality Date   APPENDECTOMY     CESAREAN SECTION     x 2   CHOLECYSTECTOMY     COLONOSCOPY     normal-2010   DILATION AND CURETTAGE OF UTERUS  2008   KNEE SURGERY Right    x3   LAPAROSCOPY     endometriosis   lipotripsy     x 1 - kidney stone removal with stent   TUBAL LIGATION     UPPER GASTROINTESTINAL ENDOSCOPY  12/26/2014   Erosive esophagitis (LA grade B). Small hiatal hernoa. Mild gastritis   VAGUS NERVE STIMULATOR INSERTION N/A 07/04/2014   Procedure: VAGAL NERVE STIMULATOR IMPLANT;  Surgeon: Lisbeth Renshaw, MD;  Location: [facility];  Service: Neurosurgery;  Laterality: N/A;   VAGUS NERVE STIMULATOR INSERTION N/A 08/28/2020   Procedure: VAGUS NERVE STIMULATOR BATTERY REPLACEMENT;  Surgeon: Lisbeth Renshaw, MD;  Location: [facility];  Service: Neurosurgery;  Laterality: N/A;    FAMILY HISTORY: Family History  Problem Relation Age of Onset   Cancer Mother    Diabetes Father    Cancer Maternal Grandmother    Cancer Maternal Grandfather    Diabetes Paternal Grandmother    Diabetes Paternal Grandfather    Migraines Neg Hx    Colon cancer Neg Hx    Rectal cancer Neg Hx    Stomach cancer Neg Hx    Esophageal cancer Neg Hx     SOCIAL HISTORY: Social History   Socioeconomic History   Marital  status: Married    Spouse name: Ryan   Number of children: 2   Years of education: Some colle   Highest education level: Not on file  Occupational History   Not on file  Tobacco Use   Smoking status: Former    Types: Cigarettes    Quit date: 1999    Years since quitting: 25.3   Smokeless tobacco: Never  Vaping Use   Vaping Use: Never used  Substance and Sexual Activity   Alcohol use: Yes    Comment: social   Drug use: No   Sexual activity: Yes    Birth control/protection: Surgical  Comment: Tubal Ligation  Other Topics Concern   Not on file  Social History Narrative   Lives at home with husband, [name]   Daily caffeine consumption- 1 cup   Social Determinants of Health   Financial Resource Strain: Not on file  Food Insecurity: Not on file  Transportation Needs: Not on file  Physical Activity: Not on file  Stress: Not on file  Social Connections: Not on file  Intimate Partner Violence: Not on file      PHYSICAL EXAM Generalized: Well developed, in no acute distress   Neurological examination  Mentation: Alert oriented to time, place, history taking. Follows all commands speech and language fluent Cranial nerve II-XII: Facial symmetry noted  DIAGNOSTIC DATA (LABS, IMAGING, TESTING) - I reviewed patient records, labs, notes, testing and imaging myself where available.  Lab Results  Component Value Date   WBC 6.4 08/26/2020   HGB 14.3 08/26/2020   HCT 43.3 08/26/2020   MCV 93.1 08/26/2020   PLT 321 08/26/2020      Component Value Date/Time   NA 144 07/15/2020 0917   K 4.3 07/15/2020 0917   CL 108 (H) 07/15/2020 0917   CO2 20 07/15/2020 0917   GLUCOSE 92 07/15/2020 0917   GLUCOSE 97 03/14/2018 2138   BUN 16 07/15/2020 0917   CREATININE 0.83 07/15/2020 0917   CALCIUM 9.4 07/15/2020 0917   PROT 7.0 07/15/2020 0917   ALBUMIN 4.5 07/15/2020 0917   AST 15 07/15/2020 0917   ALT 16 07/15/2020 0917   ALKPHOS 91 07/15/2020 0917   BILITOT 0.3 07/15/2020  0917   GFRNONAA 82 07/09/2019 0901   GFRAA 95 07/09/2019 0901    Lab Results  Component Value Date   TSH 1.260 05/08/2014      ASSESSMENT AND PLAN 47 y.o. year old female  has a past medical history of COVID (2021), Family history of adverse reaction to anesthesia, GERD (gastroesophageal reflux disease), Headache, History of hiatal hernia, History of kidney stones, IBS (irritable bowel syndrome), Interstitial cystitis, Osgood-Schlatter's disease, Pneumonia (2014), and Seizures (HCC). here with:  1.  Seizures  Continue Lamictal 150 mg twice a day Continue Topamax 50 mg twice a day Patient will have VNS interrogation at her next visit with Dr. Vickey Huger Advised that if her seizure frequency or severity changes she should let us know Follow-up in 6 months or sooner if needed      Butch Penny, MSN, NP-C 07/06/2022, 8:37 AM [facility] [address] [place], [place] [address] [phone]

## 2022-07-29 ENCOUNTER — Encounter: Payer: Self-pay | Admitting: Gastroenterology

## 2022-08-09 ENCOUNTER — Encounter: Payer: Self-pay | Admitting: Certified Registered Nurse Anesthetist

## 2022-08-15 ENCOUNTER — Encounter: Payer: Self-pay | Admitting: Gastroenterology

## 2022-08-15 ENCOUNTER — Ambulatory Visit (AMBULATORY_SURGERY_CENTER): Payer: Medicare Other | Admitting: Gastroenterology

## 2022-08-15 VITALS — BP 117/76 | HR 82 | Temp 98.9°F | Resp 15 | Ht 62.0 in | Wt 243.0 lb

## 2022-08-15 DIAGNOSIS — K2270 Barrett's esophagus without dysplasia: Secondary | ICD-10-CM | POA: Diagnosis not present

## 2022-08-15 DIAGNOSIS — K635 Polyp of colon: Secondary | ICD-10-CM | POA: Diagnosis not present

## 2022-08-15 DIAGNOSIS — K2100 Gastro-esophageal reflux disease with esophagitis, without bleeding: Secondary | ICD-10-CM | POA: Diagnosis not present

## 2022-08-15 DIAGNOSIS — Z1211 Encounter for screening for malignant neoplasm of colon: Secondary | ICD-10-CM | POA: Diagnosis not present

## 2022-08-15 DIAGNOSIS — D125 Benign neoplasm of sigmoid colon: Secondary | ICD-10-CM | POA: Diagnosis not present

## 2022-08-15 MED ORDER — SODIUM CHLORIDE 0.9 % IV SOLN: 500.0000 mL | Freq: Once | INTRAVENOUS | Status: AC

## 2022-08-15 NOTE — Progress Notes (Signed)
Patient experiencing nausea and vomiting.  MD updated and Zofran 4 mg IV given, vss 

## 2022-08-15 NOTE — Patient Instructions (Addendum)
YOU HAD AN ENDOSCOPIC PROCEDURE TODAY AT THE [facility]:   Refer to the procedure report that was given to you for any specific questions about what was found during the examination.  If the procedure report does not answer your questions, please call your gastroenterologist to clarify.  If you requested that your care partner not be given the details of your procedure findings, then the procedure report has been included in a sealed envelope for you to review at your convenience later.  YOU SHOULD EXPECT: Some feelings of bloating in the abdomen. Passage of more gas than usual.  Walking can help get rid of the air that was put into your GI tract during the procedure and reduce the bloating. If you had a lower endoscopy (such as a colonoscopy or flexible sigmoidoscopy) you may notice spotting of blood in your stool or on the toilet paper. If you underwent a bowel prep for your procedure, you may not have a normal bowel movement for a few days.  Please Note:  You might notice some irritation and congestion in your nose or some drainage.  This is from the oxygen used during your procedure.  There is no need for concern and it should clear up in a day or so.  SYMPTOMS TO REPORT IMMEDIATELY:  Following lower endoscopy (colonoscopy or flexible sigmoidoscopy):  Excessive amounts of blood in the stool  Significant tenderness or worsening of abdominal pains  Swelling of the abdomen that is new, acute  Fever of 100F or higher  Following upper endoscopy (EGD)  Vomiting of blood or coffee ground material  New chest pain or pain under the shoulder blades  Painful or persistently difficult swallowing  New shortness of breath  Fever of 100F or higher  Black, tarry-looking stools  For urgent or emergent issues, a gastroenterologist can be reached at any hour by calling [phone]. Do not use MyChart messaging for urgent concerns.    DIET:  We do recommend a small meal at first, but  then you may proceed to your regular diet.  Drink plenty of fluids but you should avoid alcoholic beverages for 24 hours.  MEDICATIONS: Continue present medications. No Aspirin, Ibuprofen, Naproxen, or other non-steroidal anti-inflammatory drugs.  FOLLOW UP: Await pathology results. Repeat colonoscopy for surveillance based on pathology results. Follow up with Dr. Chales Abrahams in his office in 12 weeks, office appointment made for 11/10/2022 at 10:40 am., consider further workup if you have continued problems.  Please see handouts given to you by your recovery nurse: Polyps, hemorrhoids, Hiatal hernia, esophagitis.  ACTIVITY:  You should plan to take it easy for the rest of today and you should NOT DRIVE or use heavy machinery until tomorrow (because of the sedation medicines used during the test).    FOLLOW UP: Our staff will call the number listed on your records the next business day following your procedure.  We will call around 7:15- 8:00 am to check on you and address any questions or concerns that you may have regarding the information given to you following your procedure. If we do not reach you, we will leave a message.     If any biopsies were taken you will be contacted by phone or by letter within the next 1-3 weeks.  Please call us at [phone] if you have not heard about the biopsies in 3 weeks.    SIGNATURES/CONFIDENTIALITY: You and/or your care partner have signed paperwork which will be entered into your electronic medical  record.  These signatures attest to the fact that that the information above on your After Visit Summary has been reviewed and is understood.  Full responsibility of the confidentiality of this discharge information lies with you and/or your care-partner. 

## 2022-08-15 NOTE — Progress Notes (Signed)
Report given to PACU, vss 

## 2022-08-15 NOTE — Op Note (Signed)
[facility] Patient Name: Traci [name] Procedure Date: 08/15/2022 2:52 PM MRN: [id number] Endoscopist: Lynann Bologna , MD, [id number] Age: 47 Referring MD:  Date of Birth: 1975-07-15 Gender: Female Account #: [id number] Procedure:                Colonoscopy Indications:              Screening for colorectal malignant neoplasm Medicines:                Monitored Anesthesia Care Procedure:                Pre-Anesthesia Assessment:                           - Prior to the procedure, a History and Physical                            was performed, and patient medications and                            allergies were reviewed. The patient's tolerance of                            previous anesthesia was also reviewed. The risks                            and benefits of the procedure and the sedation                            options and risks were discussed with the patient.                            All questions were answered, and informed consent                            was obtained. Prior Anticoagulants: The patient has                            taken no anticoagulant or antiplatelet agents. ASA                            Grade Assessment: II - A patient with mild systemic                            disease. After reviewing the risks and benefits,                            the patient was deemed in satisfactory condition to                            undergo the procedure.                           After obtaining informed consent, the colonoscope  was passed under direct vision. Throughout the                            procedure, the patient's blood pressure, pulse, and                            oxygen saturations were monitored continuously. The                            CF [id number] #[id number] was introduced through the anus                            and advanced to the 2 cm into the ileum. The                            colonoscopy was  performed without difficulty. The                            patient tolerated the procedure well. The quality                            of the bowel preparation was adequate to identify                            polyps. The terminal ileum, ileocecal valve,                            appendiceal orifice, and rectum were photographed. Scope In: 3:21:38 PM Scope Out: 3:32:23 PM Scope Withdrawal Time: 0 hours 8 minutes 15 seconds  Total Procedure Duration: 0 hours 10 minutes 45 seconds  Findings:                 A 6 mm polyp was found in the proximal sigmoid                            colon. The polyp was sessile. The polyp was removed                            with a cold snare. Resection and retrieval were                            complete.                           Non-bleeding internal hemorrhoids were found during                            retroflexion. The hemorrhoids were small and Grade                            I (internal hemorrhoids that do not prolapse).                           The terminal ileum appeared normal.  The exam was otherwise without abnormality on                            direct and retroflexion views. Complications:            No immediate complications. Estimated Blood Loss:     Estimated blood loss: none. Impression:               - One 6 mm polyp in the proximal sigmoid colon,                            removed with a cold snare. Resected and retrieved.                           - Non-bleeding internal hemorrhoids.                           - The examined portion of the ileum was normal.                           - The examination was otherwise normal on direct                            and retroflexion views. Recommendation:           - Patient has a contact number available for                            emergencies. The signs and symptoms of potential                            delayed complications were discussed with the                             patient. Return to normal activities tomorrow.                            Written discharge instructions were provided to the                            patient.                           - Resume previous diet.                           - Continue present medications.                           - Await pathology results.                           - Repeat colonoscopy for surveillance based on                            pathology results.                           -  The findings and recommendations were discussed                            with the patient's family. Lynann Bologna, MD 08/15/2022 3:37:28 PM This report has been signed electronically.

## 2022-08-15 NOTE — Progress Notes (Signed)
Chief Complaint: FU  Referring Provider:  Dr Jeanie Sewer      ASSESSMENT AND PLAN;   #1. 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  No Barrett's on biopsies. #2. CRC sceening    Plan: -Continue Protonix 40mg  po bid #180, 4 refills.  Can try to reduce it to once a day.  Explained long term risks and benefits. -EGD/colon for CRC screening -Nonpharmacologic means of reflux control was stressed.  I encouraged her to reduce weight. Avoid fatty foods. -Appt with Dr. Jeanie Sewer for routine annual physical. She will make one -D/W pt and husband   I discussed EGD/Colonoscopy- the indications, risks, alternatives and potential complications including, but not limited to bleeding, infection, reaction to meds, damage to internal organs, cardiac and/or pulmonary problems, and perforation requiring surgery. The possibility that significant findings could be missed was explained. All ? were answered. Pt consents to proceed. HPI:    Traci [name] is a 47 y.o. female  For follow-up visit Here for EGD/colonoscopy  Tolerating Protonix 40 twice daily.  Occasional breakthrough symptoms. She denies having any odynophagia, dysphagia, melena or hematochezia. She does not eat late at night. Unfortunately she has gained weight as below.  No sodas, chocolates, chewing gums, artificial sweeteners and candy. No NSAIDs  Denies having any diarrhea or constipation.  She is also here for colorectal cancer screening by means of colonoscopy.   Wt Readings from Last 3 Encounters:  08/15/22 243 lb (110.2 kg)  06/15/22 243 lb (110.2 kg)  12/15/21 239 lb 8 oz (108.6 kg)      Past Medical History:  Diagnosis Date   COVID 2021   also had in 2020   Family history of adverse reaction to anesthesia    Father had nausea post anesthesia   GERD (gastroesophageal reflux disease)    Headache    History of hiatal hernia    History of kidney stones    passed stones-no surgery   IBS (irritable bowel syndrome)     Interstitial cystitis    Morbid (severe) obesity due to excess calories (HCC) 08/28/2020   bmi 41.14   Osgood-Schlatter's disease    "diagnosed years ago"   Pneumonia 2014   Seizures (HCC)    last one 02/2018 - on new medication    Past Surgical History:  Procedure Laterality Date   APPENDECTOMY     CESAREAN SECTION     x 2   CHOLECYSTECTOMY     COLONOSCOPY     normal-2010   DILATION AND CURETTAGE OF UTERUS  2008   KNEE SURGERY Right    x3   LAPAROSCOPY     endometriosis   lipotripsy     x 1 - kidney stone removal with stent   TUBAL LIGATION     UPPER GASTROINTESTINAL ENDOSCOPY  12/26/2014   Erosive esophagitis (LA grade B). Small hiatal hernoa. Mild gastritis   VAGUS NERVE STIMULATOR INSERTION N/A 07/04/2014   Procedure: VAGAL NERVE STIMULATOR IMPLANT;  Surgeon: Lisbeth Renshaw, MD;  Location: [facility];  Service: Neurosurgery;  Laterality: N/A;   VAGUS NERVE STIMULATOR INSERTION N/A 08/28/2020   Procedure: VAGUS NERVE STIMULATOR BATTERY REPLACEMENT;  Surgeon: Lisbeth Renshaw, MD;  Location: [facility];  Service: Neurosurgery;  Laterality: N/A;    Family History  Problem Relation Age of Onset   Cancer Mother    Diabetes Father    Cancer Maternal Grandmother    Cancer Maternal Grandfather    Diabetes Paternal Grandmother    Diabetes  Paternal Grandfather    Migraines Neg Hx    Colon cancer Neg Hx    Rectal cancer Neg Hx    Stomach cancer Neg Hx    Esophageal cancer Neg Hx     Social History   Tobacco Use   Smoking status: Former    Types: Cigarettes    Quit date: 1999    Years since quitting: 25.4   Smokeless tobacco: Never  Vaping Use   Vaping Use: Never used  Substance Use Topics   Alcohol use: Yes    Comment: social   Drug use: No    Current Outpatient Medications  Medication Sig Dispense Refill   lamoTRIgine (LAMICTAL) 150 MG tablet Take 1 tablet (150 mg total) by mouth 2 (two) times daily. 180 tablet 3   pantoprazole (PROTONIX) 40 MG tablet Take  1 tablet (40 mg total) by mouth 2 (two) times daily. TAKE 1 TABLET(40 MG) BY MOUTH TWICE DAILY BEFORE A MEAL. 180 tablet 4   topiramate (TOPAMAX) 100 MG tablet Take 0.5 tablets (50 mg total) by mouth in the morning and at bedtime. 180 tablet 3   promethazine (PHENERGAN) 25 MG tablet Take 1 tablet (25 mg total) by mouth every 8 (eight) hours as needed for nausea or vomiting. 30 tablet 5   Current Facility-Administered Medications  Medication Dose Route Frequency Provider Last Rate Last Admin   0.9 %  sodium chloride infusion  500 mL Intravenous Once Lynann Bologna, MD       0.9 %  sodium chloride infusion  500 mL Intravenous Once Lynann Bologna, MD        No Known Allergies  Review of Systems:  neg     Physical Exam:    BP (!) 142/79 (BP Location: Left Arm, Patient Position: Sitting, Cuff Size: Normal)   Pulse 94   Temp 98.9 F (37.2 C) (Temporal)   Ht 5\' 2"  (1.575 m)   Wt 243 lb (110.2 kg)   LMP 08/15/2022   SpO2 99%   BMI 44.45 kg/m  Wt Readings from Last 3 Encounters:  08/15/22 243 lb (110.2 kg)  06/15/22 243 lb (110.2 kg)  12/15/21 239 lb 8 oz (108.6 kg)   Constitutional:  Well-developed, in no acute distress. Psychiatric: Normal mood and affect. Behavior is normal. HEENT: Pupils normal.  Conjunctivae are normal. No scleral icterus. Abdominal: Soft, nondistended. Nontender. Bowel sounds active throughout. There are no masses palpable. No hepatomegaly. Rectal:  defered Neurological: Alert and oriented to person place and time. Skin: Skin is warm and dry. No rashes noted.  Data Reviewed: I have personally reviewed following labs and imaging studies  CBC:    Latest Ref Rng & Units 08/26/2020    8:41 AM 07/09/2019    9:01 AM 01/08/2019    9:38 AM  CBC  WBC 4.0 - 10.5 K/uL 6.4  6.7  7.2   Hemoglobin 12.0 - 15.0 g/dL 16.1  09.6  04.5   Hematocrit 36.0 - 46.0 % 43.3  43.8  41.8   Platelets 150 - 400 K/uL 321  320  304     CMP:    Latest Ref Rng & Units 07/15/2020     9:17 AM 07/09/2019    9:01 AM 01/08/2019    9:38 AM  CMP  Glucose 65 - 99 mg/dL 92  92  92   BUN 6 - 24 mg/dL 16  23  11    Creatinine 0.57 - 1.00 mg/dL 4.09  8.11  9.14   Sodium  134 - 144 mmol/L 144  140  138   Potassium 3.5 - 5.2 mmol/L 4.3  3.9  4.1   Chloride 96 - 106 mmol/L 108  105  106   CO2 20 - 29 mmol/L 20  21  19    Calcium 8.7 - 10.2 mg/dL 9.4  9.3  9.3   Total Protein 6.0 - 8.5 g/dL 7.0  6.7  6.7   Total Bilirubin 0.0 - 1.2 mg/dL 0.3  0.3  0.4   Alkaline Phos 44 - 121 IU/L 91  101  72   AST 0 - 40 IU/L 15  14  19    ALT 0 - 32 IU/L 16  12  25        Edman Circle, MD 08/15/2022, 2:51 PM  Cc: Dr. Gwendlyn Deutscher

## 2022-08-15 NOTE — Progress Notes (Signed)
1505 Robinul 0.1 mg IV given due large amount of secretions upon assessment.  MD made aware, vss 

## 2022-08-15 NOTE — Op Note (Signed)
[facility] Patient Name: Traci [name] Procedure Date: 08/15/2022 2:57 PM MRN: [id number] Endoscopist: Lynann Bologna , MD, [id number] Age: 47 Referring MD:  Date of Birth: 1975/12/02 Gender: Female Account #: [id number] Procedure:                Upper GI endoscopy Indications:              GERD Medicines:                Monitored Anesthesia Care Procedure:                Pre-Anesthesia Assessment:                           - Prior to the procedure, a History and Physical                            was performed, and patient medications and                            allergies were reviewed. The patient's tolerance of                            previous anesthesia was also reviewed. The risks                            and benefits of the procedure and the sedation                            options and risks were discussed with the patient.                            All questions were answered, and informed consent                            was obtained. Prior Anticoagulants: The patient has                            taken no anticoagulant or antiplatelet agents. ASA                            Grade Assessment: II - A patient with mild systemic                            disease. After reviewing the risks and benefits,                            the patient was deemed in satisfactory condition to                            undergo the procedure.                           After obtaining informed consent, the endoscope was  passed under direct vision. Throughout the                            procedure, the patient's blood pressure, pulse, and                            oxygen saturations were monitored continuously. The                            Olympus Scope [id number] was introduced through the                            mouth, and advanced to the second part of duodenum.                            The upper GI endoscopy was accomplished without                             difficulty. The patient tolerated the procedure                            well. Scope In: Scope Out: Findings:                 LA Grade A (one or more mucosal breaks less than 5                            mm, not extending between tops of 2 mucosal folds)                            esophagitis with no bleeding was found 34 to 35 cm                            from the incisors. Biopsies were taken with a cold                            forceps for histology.                           A 2 cm hiatal hernia was present.                           The entire examined stomach was normal. Biopsies                            were taken with a cold forceps for histology.                           The examined duodenum was normal. Biopsies for                            histology were taken with a cold forceps for  evaluation of celiac disease. Complications:            No immediate complications. Estimated Blood Loss:     Estimated blood loss: none. Impression:               - LA Grade A reflux esophagitis with no bleeding.                            Biopsied.                           - 2 cm hiatal hernia.                           - Normal stomach. Biopsied.                           - Normal examined duodenum. Biopsied. Recommendation:           - Patient has a contact number available for                            emergencies. The signs and symptoms of potential                            delayed complications were discussed with the                            patient. Return to normal activities tomorrow.                            Written discharge instructions were provided to the                            patient.                           - Resume previous diet.                           - Continue present medications.                           - Await pathology results.                           - FU GI in 12 weeks. If continued  problems, further                            workup.                           - No aspirin, ibuprofen, naproxen, or other                            non-steroidal anti-inflammatory drugs.                           -  The findings and recommendations were discussed                            with the patient's family. Lynann Bologna, MD 08/15/2022 3:35:33 PM This report has been signed electronically.

## 2022-08-15 NOTE — Progress Notes (Signed)
1418 Spoke with Cathlyn Parsons Lead CRNA and Dr Chales Abrahams, cleared by them to do case at [facility].

## 2022-08-15 NOTE — Progress Notes (Addendum)
Vitals-CW    Patient states that she had a grand mal seizure ;last Thursday.  Dr Chales Abrahams and Charlsie Merles, CRNA notified, and they will make a decision regarding her procedure.  History reviewed thoroughly. Patient has a magnet stimulater in her chest.

## 2022-08-15 NOTE — Progress Notes (Signed)
Called to room to assist during endoscopic procedure.  Patient ID and intended procedure confirmed with present staff. Received instructions for my participation in the procedure from the performing physician.  

## 2022-08-16 ENCOUNTER — Telehealth: Payer: Self-pay

## 2022-08-16 NOTE — Telephone Encounter (Signed)
  Follow up Call-     08/15/2022    1:51 PM 08/15/2022    1:45 PM  Call back number  Post procedure Call Back phone  # [phone]   Permission to leave phone message  Yes    Post op call attempted, no answer, left WM.

## 2022-08-17 ENCOUNTER — Other Ambulatory Visit: Payer: Self-pay | Admitting: Neurology

## 2022-08-26 ENCOUNTER — Encounter: Payer: Self-pay | Admitting: Gastroenterology

## 2022-11-10 ENCOUNTER — Ambulatory Visit (INDEPENDENT_AMBULATORY_CARE_PROVIDER_SITE_OTHER): Payer: Medicare Other

## 2022-11-10 ENCOUNTER — Ambulatory Visit: Payer: Medicare Other | Admitting: Gastroenterology

## 2022-11-10 ENCOUNTER — Encounter: Payer: Self-pay | Admitting: Gastroenterology

## 2022-11-10 VITALS — BP 124/80 | HR 92 | Ht 62.0 in | Wt 236.0 lb

## 2022-11-10 DIAGNOSIS — K2100 Gastro-esophageal reflux disease with esophagitis, without bleeding: Secondary | ICD-10-CM | POA: Diagnosis not present

## 2022-11-10 DIAGNOSIS — R109 Unspecified abdominal pain: Secondary | ICD-10-CM | POA: Diagnosis not present

## 2022-11-10 DIAGNOSIS — K449 Diaphragmatic hernia without obstruction or gangrene: Secondary | ICD-10-CM

## 2022-11-10 DIAGNOSIS — K582 Mixed irritable bowel syndrome: Secondary | ICD-10-CM

## 2022-11-10 LAB — CBC WITH DIFFERENTIAL/PLATELET
Basophils Absolute: 0.1 10*3/uL (ref 0.0–0.1)
Basophils Relative: 0.9 % (ref 0.0–3.0)
Eosinophils Absolute: 0.2 10*3/uL (ref 0.0–0.7)
Eosinophils Relative: 2.5 % (ref 0.0–5.0)
HCT: 42.1 % (ref 36.0–46.0)
Hemoglobin: 13.7 g/dL (ref 12.0–15.0)
Lymphocytes Relative: 28.5 % (ref 12.0–46.0)
Lymphs Abs: 2.2 10*3/uL (ref 0.7–4.0)
MCHC: 32.6 g/dL (ref 30.0–36.0)
MCV: 91.8 fl (ref 78.0–100.0)
Monocytes Absolute: 0.7 10*3/uL (ref 0.1–1.0)
Monocytes Relative: 9.5 % (ref 3.0–12.0)
Neutro Abs: 4.6 10*3/uL (ref 1.4–7.7)
Neutrophils Relative %: 58.6 % (ref 43.0–77.0)
Platelets: 346 10*3/uL (ref 150.0–400.0)
RBC: 4.59 Mil/uL (ref 3.87–5.11)
RDW: 13.1 % (ref 11.5–15.5)
WBC: 7.9 10*3/uL (ref 4.0–10.5)

## 2022-11-10 LAB — COMPREHENSIVE METABOLIC PANEL
ALT: 15 U/L (ref 0–35)
AST: 12 U/L (ref 0–37)
Albumin: 4.1 g/dL (ref 3.5–5.2)
Alkaline Phosphatase: 63 U/L (ref 39–117)
BUN: 18 mg/dL (ref 6–23)
CO2: 23 meq/L (ref 19–32)
Calcium: 9.5 mg/dL (ref 8.4–10.5)
Chloride: 107 meq/L (ref 96–112)
Creatinine, Ser: 0.85 mg/dL (ref 0.40–1.20)
GFR: 82.64 mL/min (ref >60.00)
Glucose, Bld: 93 mg/dL (ref 70–99)
Potassium: 4 meq/L (ref 3.5–5.1)
Sodium: 140 meq/L (ref 135–145)
Total Bilirubin: 0.4 mg/dL (ref 0.2–1.2)
Total Protein: 7 g/dL (ref 6.0–8.3)

## 2022-11-10 LAB — C-REACTIVE PROTEIN: CRP: <1 mg/dL (ref 0.5–20.0)

## 2022-11-10 MED ORDER — DICYCLOMINE HCL 10 MG PO CAPS: 10.0000 mg | ORAL_CAPSULE | Freq: Two times a day (BID) | ORAL | 3 refills | Status: AC

## 2022-11-10 NOTE — Patient Instructions (Signed)
_______________________________________________________  If your blood pressure at your visit was 140/90 or greater, please contact your primary care physician to follow up on this.  _______________________________________________________  If you are age 45 or older, your body mass index should be between 23-30. Your Body mass index is 43.16 kg/m. If this is out of the aforementioned range listed, please consider follow up with your Primary Care Provider.  If you are age 52 or younger, your body mass index should be between 19-25. Your Body mass index is 43.16 kg/m. If this is out of the aformentioned range listed, please consider follow up with your Primary Care Provider.   ________________________________________________________  The [facility] providers would like to encourage you to use [facility] to communicate with providers for non-urgent requests or questions.  Due to long hold times on the telephone, sending your provider a message by [facility] may be a faster and more efficient way to get a response.  Please allow 48 business hours for a response.  Please remember that this is for non-urgent requests.  _______________________________________________________  We have sent the following medications to your pharmacy for you to pick up at your convenience: Bentyl  Protonix 40mg  2 times a day  Repeat EGD for 08-2025. Please call 2 months prior to schedule this. A letter will be sent as it gets closer.  Your provider has requested that you go to the basement level for lab work before leaving today. Press "B" on the elevator. The lab is located at the first door on the left as you exit the elevator.  You have been scheduled for a CT scan of the abdomen and pelvis at [facility] ([address] [place], [place], [place] [address]).   You are scheduled on 11-11-2022 at 1130am. You should arrive by 9:15am to your appointment time for registration. Please follow the written instructions below on  the day of your exam:  WARNING: IF YOU ARE ALLERGIC TO IODINE/X-RAY DYE, PLEASE NOTIFY RADIOLOGY IMMEDIATELY AT [phone]! YOU WILL BE GIVEN A 13 HOUR PREMEDICATION PREP.  1) Do not eat or drink anything after 730am (4 hours prior to your test) 2) You have been given 2 bottles of oral contrast to drink. The solution may taste better if refrigerated, but do NOT add ice or any other liquid to this solution. Shake well before drinking.    Drink 1 bottle of contrast @ 930am (2 hours prior to your exam)  Drink 1 bottle of contrast @ 1030am (1 hour prior to your exam)  You may take any medications as prescribed with a small amount of water, if necessary. If you take any of the following medications: METFORMIN, GLUCOPHAGE, GLUCOVANCE, AVANDAMET, RIOMET, FORTAMET, ACTOPLUS MET, JANUMET, GLUMETZA or METAGLIP, you MAY be asked to HOLD this medication 48 hours AFTER the exam.  The purpose of you drinking the oral contrast is to aid in the visualization of your intestinal tract. The contrast solution may cause some diarrhea. Depending on your individual set of symptoms, you may also receive an intravenous injection of x-ray contrast/dye. Plan on being at [facility] for 30 minutes or longer, depending on the type of exam you are having performed.  This test typically takes 30-45 minutes to complete.  If you have any questions regarding your exam or if you need to reschedule, you may call the CT department at [phone] between the hours of 8:00 am and 5:00 pm, Monday-Friday.  ________________________________________________________________________

## 2022-11-10 NOTE — Progress Notes (Signed)
Chief Complaint: FU  Referring Provider:  Dr Jeanie Sewer      ASSESSMENT AND PLAN;   #1. GERD with HH and EE on EGD 07/2022.  Bx- Barrett's on biopsies. #2. Abdo pain. Neg EGD/colonoscopy 07/2022 #3. IBS with alt diarrhea and constipation    Plan: -Continue Protonix 40mg  po bid #180, 4 refills.  Can try to reduce it to QD.  Explained long term risks and benefits. -Rpt EGD 3 yrs (R/O Barrett's/dysplasia) -CBC, CMP, CRP -CT AP with contrast -Trial of Bentyl 10mg  po BID #60. -Brochures regarding GERD given. -D/W pt and husband    HPI:    SKYLIER [name] is a 47 y.o. female  For follow-up visit  C/O gen Abdo pain with alt diarrhea and constipation. Occ more prominent in the left lower quadrant.  Tolerating Protonix 40 twice daily.  Occasional breakthrough symptoms. She denies having any odynophagia, dysphagia, melena or hematochezia. She does not eat late at night. She has been trying to lose weight and has been able to lose weight as below.  No sodas, chocolates, chewing gums, artificial sweeteners and candy. No NSAIDs   Wt Readings from Last 3 Encounters:  11/10/22 236 lb (107 kg)  08/15/22 243 lb (110.2 kg)  06/15/22 243 lb (110.2 kg)   Past GI workup EGD 07/2022 - LA Grade A reflux esophagitis with no bleeding. Bx-Barrett's esophagus - 2 cm hiatal hernia.  - Normal stomach. Biopsied. Bx negative for HP - Normal examined duodenum. Bx- neg for celiac   Colonoscopy 07/2022 One 6 mm polyp in the proximal sigmoid colon, removed with a cold snare. Resected and retrieved. Bx-hyperplastic - Non-bleeding internal hemorrhoids. - The examined portion of the ileum was normal. - The examination was otherwise normal on direct and retroflexion views. -Repeat in 46yrs   Past Medical History:  Diagnosis Date   COVID 2021   also had in 2020   Family history of adverse reaction to anesthesia    Father had nausea post anesthesia   GERD (gastroesophageal reflux disease)     Headache    History of hiatal hernia    History of kidney stones    passed stones-no surgery   IBS (irritable bowel syndrome)    Interstitial cystitis    Morbid (severe) obesity due to excess calories (HCC) 08/28/2020   bmi 41.14   Osgood-Schlatter's disease    "diagnosed years ago"   Pneumonia 2014   Seizures (HCC)    last one 02/2018 - on new medication    Past Surgical History:  Procedure Laterality Date   APPENDECTOMY     CESAREAN SECTION     x 2   CHOLECYSTECTOMY     COLONOSCOPY     normal-2010   DILATION AND CURETTAGE OF UTERUS  2008   KNEE SURGERY Right    x3   LAPAROSCOPY     endometriosis   lipotripsy     x 1 - kidney stone removal with stent   TUBAL LIGATION     UPPER GASTROINTESTINAL ENDOSCOPY  12/26/2014   Erosive esophagitis (LA grade B). Small hiatal hernoa. Mild gastritis   VAGUS NERVE STIMULATOR INSERTION N/A 07/04/2014   Procedure: VAGAL NERVE STIMULATOR IMPLANT;  Surgeon: Lisbeth Renshaw, MD;  Location: [facility];  Service: Neurosurgery;  Laterality: N/A;   VAGUS NERVE STIMULATOR INSERTION N/A 08/28/2020   Procedure: VAGUS NERVE STIMULATOR BATTERY REPLACEMENT;  Surgeon: Lisbeth Renshaw, MD;  Location: [facility];  Service: Neurosurgery;  Laterality: N/A;    Family  History  Problem Relation Age of Onset   Cancer Mother    Diabetes Father    Cancer Maternal Grandmother    Cancer Maternal Grandfather    Diabetes Paternal Grandmother    Diabetes Paternal Grandfather    Migraines Neg Hx    Colon cancer Neg Hx    Rectal cancer Neg Hx    Stomach cancer Neg Hx    Esophageal cancer Neg Hx     Social History   Tobacco Use   Smoking status: Former    Current packs/day: 0.00    Types: Cigarettes    Quit date: 1999    Years since quitting: 25.7   Smokeless tobacco: Never  Vaping Use   Vaping status: Never Used  Substance Use Topics   Alcohol use: Yes    Comment: social   Drug use: No    Current Outpatient Medications  Medication Sig  Dispense Refill   lamoTRIgine (LAMICTAL) 150 MG tablet Take 1 tablet (150 mg total) by mouth 2 (two) times daily. 180 tablet 3   pantoprazole (PROTONIX) 40 MG tablet Take 1 tablet (40 mg total) by mouth 2 (two) times daily. TAKE 1 TABLET(40 MG) BY MOUTH TWICE DAILY BEFORE A MEAL. 180 tablet 4   promethazine (PHENERGAN) 25 MG tablet Take 1 tablet (25 mg total) by mouth every 8 (eight) hours as needed for nausea or vomiting. 30 tablet 5   topiramate (TOPAMAX) 100 MG tablet Take 0.5 tablets (50 mg total) by mouth in the morning and at bedtime. 180 tablet 3   Current Facility-Administered Medications  Medication Dose Route Frequency Provider Last Rate Last Admin   0.9 %  sodium chloride infusion  500 mL Intravenous Once Lynann Bologna, MD       0.9 %  sodium chloride infusion  500 mL Intravenous Once Lynann Bologna, MD        No Known Allergies  Review of Systems:  neg     Physical Exam:    BP 124/80   Pulse 92   Ht 5\' 2"  (1.575 m)   Wt 236 lb (107 kg)   BMI 43.16 kg/m  Wt Readings from Last 3 Encounters:  11/10/22 236 lb (107 kg)  08/15/22 243 lb (110.2 kg)  06/15/22 243 lb (110.2 kg)   Constitutional:  Well-developed, in no acute distress. Psychiatric: Normal mood and affect. Behavior is normal. HEENT: Pupils normal.  Conjunctivae are normal. No scleral icterus. Abdominal: Soft, nondistended.  Generalized abdominal tenderness.  Bowel sounds active throughout. There are no masses palpable. No hepatomegaly. Rectal:  defered Neurological: Alert and oriented to person place and time. Skin: Skin is warm and dry. No rashes noted.  Data Reviewed: I have personally reviewed following labs and imaging studies  CBC:    Latest Ref Rng & Units 08/26/2020    8:41 AM 07/09/2019    9:01 AM 01/08/2019    9:38 AM  CBC  WBC 4.0 - 10.5 K/uL 6.4  6.7  7.2   Hemoglobin 12.0 - 15.0 g/dL 40.3  47.4  25.9   Hematocrit 36.0 - 46.0 % 43.3  43.8  41.8   Platelets 150 - 400 K/uL 321  320  304      CMP:    Latest Ref Rng & Units 07/15/2020    9:17 AM 07/09/2019    9:01 AM 01/08/2019    9:38 AM  CMP  Glucose 65 - 99 mg/dL 92  92  92   BUN 6 - 24 mg/dL 16  23  11   Creatinine 0.57 - 1.00 mg/dL 4.54  0.98  1.19   Sodium 134 - 144 mmol/L 144  140  138   Potassium 3.5 - 5.2 mmol/L 4.3  3.9  4.1   Chloride 96 - 106 mmol/L 108  105  106   CO2 20 - 29 mmol/L 20  21  19    Calcium 8.7 - 10.2 mg/dL 9.4  9.3  9.3   Total Protein 6.0 - 8.5 g/dL 7.0  6.7  6.7   Total Bilirubin 0.0 - 1.2 mg/dL 0.3  0.3  0.4   Alkaline Phos 44 - 121 IU/L 91  101  72   AST 0 - 40 IU/L 15  14  19    ALT 0 - 32 IU/L 16  12  25        Edman Circle, MD 11/10/2022, 10:51 AM  Cc: Dr. Gwendlyn Deutscher

## 2022-11-11 ENCOUNTER — Ambulatory Visit (HOSPITAL_COMMUNITY)
Admission: RE | Admit: 2022-11-11 | Discharge: 2022-11-11 | Disposition: A | Discharge status: Home / Self Care | Payer: Medicare Other | Source: Ambulatory Visit | Attending: Gastroenterology | Admitting: Gastroenterology

## 2022-11-11 DIAGNOSIS — K449 Diaphragmatic hernia without obstruction or gangrene: Secondary | ICD-10-CM | POA: Diagnosis not present

## 2022-11-11 DIAGNOSIS — K582 Mixed irritable bowel syndrome: Secondary | ICD-10-CM | POA: Diagnosis not present

## 2022-11-11 DIAGNOSIS — R109 Unspecified abdominal pain: Secondary | ICD-10-CM

## 2022-11-11 DIAGNOSIS — K769 Liver disease, unspecified: Secondary | ICD-10-CM | POA: Diagnosis not present

## 2022-11-11 DIAGNOSIS — K5730 Diverticulosis of large intestine without perforation or abscess without bleeding: Secondary | ICD-10-CM | POA: Diagnosis not present

## 2022-11-11 MED ORDER — IOHEXOL 300 MG/ML  SOLN
100.0000 mL | Freq: Once | INTRAMUSCULAR | Status: AC | PRN
  Administered 2022-11-11: 100 mL via INTRAVENOUS

## 2022-11-11 MED ORDER — IOHEXOL 9 MG/ML PO SOLN: 500.0000 mL | ORAL | Status: AC

## 2022-11-11 MED ORDER — IOHEXOL 9 MG/ML PO SOLN
ORAL | Status: AC
  Filled 2022-11-11: qty 1000

## 2022-11-11 MED ORDER — SODIUM CHLORIDE (PF) 0.9 % IJ SOLN
INTRAMUSCULAR | Status: AC
  Filled 2022-11-11: qty 50

## 2022-11-14 ENCOUNTER — Telehealth: Payer: Self-pay | Admitting: Adult Health

## 2022-11-14 MED ORDER — PROMETHAZINE HCL 25 MG PO TABS: 25.0000 mg | ORAL_TABLET | Freq: Three times a day (TID) | ORAL | 5 refills | Status: DC | PRN

## 2022-11-14 NOTE — Telephone Encounter (Signed)
Pt reschedule appt due to daughter being admitted in the hospital. Pt reschedule appt.

## 2022-11-14 NOTE — Telephone Encounter (Signed)
Pt request refill for promethazine (PHENERGAN) 25 MG tablet sent to [facility] [phone]

## 2022-11-14 NOTE — Addendum Note (Signed)
Addended by: Bertram Savin on: 11/14/2022 03:57 PM   Modules accepted: Orders

## 2022-11-14 NOTE — Telephone Encounter (Signed)
Is she using this for nausea related to migraines?

## 2022-11-14 NOTE — Telephone Encounter (Signed)
Refills sent

## 2022-11-14 NOTE — Telephone Encounter (Signed)
Spoke with the patient.  She states she only uses it when needed for her migraines and then also for nausea that she has after a seizure.

## 2022-11-14 NOTE — Telephone Encounter (Signed)
Ok to refill 

## 2022-12-22 ENCOUNTER — Telehealth: Payer: Self-pay | Admitting: Neurology

## 2022-12-22 NOTE — Telephone Encounter (Signed)
Lvm and sent mychart msg informing pt of need to reschedule 01/17/23 appt - MD out

## 2023-01-12 ENCOUNTER — Ambulatory Visit: Payer: Medicare Other | Admitting: Neurology

## 2023-01-17 ENCOUNTER — Ambulatory Visit: Payer: Medicare Other | Admitting: Neurology

## 2023-02-13 ENCOUNTER — Other Ambulatory Visit: Payer: Self-pay

## 2023-02-13 MED ORDER — LAMOTRIGINE 150 MG PO TABS: 150.0000 mg | ORAL_TABLET | Freq: Two times a day (BID) | ORAL | 0 refills | Status: DC

## 2023-02-16 ENCOUNTER — Other Ambulatory Visit: Payer: Self-pay

## 2023-02-16 MED ORDER — TOPIRAMATE 100 MG PO TABS: 50.0000 mg | ORAL_TABLET | Freq: Two times a day (BID) | ORAL | 3 refills | Status: DC

## 2023-03-08 ENCOUNTER — Telehealth: Payer: Self-pay | Admitting: Neurology

## 2023-03-08 NOTE — Telephone Encounter (Signed)
 Pt r/s appt due to her being sick

## 2023-03-09 ENCOUNTER — Ambulatory Visit: Payer: Medicare Other | Admitting: Neurology

## 2023-04-12 ENCOUNTER — Ambulatory Visit: Payer: Medicare Other | Admitting: Neurology

## 2023-04-12 ENCOUNTER — Encounter: Payer: Self-pay | Admitting: Neurology

## 2023-04-12 VITALS — BP 127/91 | HR 87 | Ht 62.0 in | Wt 244.0 lb

## 2023-04-12 DIAGNOSIS — G40509 Epileptic seizures related to external causes, not intractable, without status epilepticus: Secondary | ICD-10-CM | POA: Diagnosis not present

## 2023-04-12 DIAGNOSIS — Z9689 Presence of other specified functional implants: Secondary | ICD-10-CM | POA: Diagnosis not present

## 2023-04-12 DIAGNOSIS — G40B19 Juvenile myoclonic epilepsy, intractable, without status epilepticus: Secondary | ICD-10-CM | POA: Diagnosis not present

## 2023-04-12 MED ORDER — UBRELVY 100 MG PO TABS: 100.0000 mg | ORAL_TABLET | ORAL | 1 refills | Status: AC | PRN

## 2023-04-12 MED ORDER — LAMOTRIGINE 150 MG PO TABS: 150.0000 mg | ORAL_TABLET | Freq: Two times a day (BID) | ORAL | 3 refills | Status: DC

## 2023-04-12 MED ORDER — PROMETHAZINE HCL 25 MG PO TABS: 25.0000 mg | ORAL_TABLET | Freq: Three times a day (TID) | ORAL | 5 refills | Status: AC | PRN

## 2023-04-12 MED ORDER — TOPIRAMATE 100 MG PO TABS: 50.0000 mg | ORAL_TABLET | Freq: Two times a day (BID) | ORAL | 3 refills | Status: AC

## 2023-04-12 NOTE — Progress Notes (Addendum)
Provider:  Melvyn Novas, MD  Primary Care Physician:  Heywood Bene, MD [address] [place] [address]     Referring Provider: Heywood Bene, Md [address] [place],  [place] [address]          Chief Complaint according to patient   Patient presents with:     JME on Vagal Nerve stimulator.            HISTORY OF PRESENT ILLNESS:  Traci [name] is a 48 y.o. female patient who is here for revisit 04/12/2023 for JME, Migraibe   Chief concern according to patient :   My daughter was dx with JME in November. Myoclonic manifestation only.  [facility] : Neoma Laming is her provider. EMU.  Daughter ( 15)  is now on Keppra 1500 bid .  And doing well- this may give US guidance in the future to try tihs medication with her.   " I had one seizure this month, 2 in January. In December  there were none, . In November 3 seizures and . " Traci [name] headaches have been stabile, not better nor worse.  Topamax.  She reports side effects form Triptans that are unpleasant.   Sleep is as good as usual.   Here to check up on VNS stimulator:  VNS interrogation today showed the aspire SR [id number] with a serial [phone] [id number] which was implanted on August 28, 2020.  Since her last office visit with me 15 December 2021 the average stimulation per day has been 446.8 and these are the normal regular device initiated stimulations.  Output status is 1.625 mA.  Lead impedance 256 7 ohms.  Generator battery power 50 to 75%.  The all of time is about 78% of the total time, auto stimulation 9%.  Parameter changes were not performed during the session and were not performed the last time we met either.  So output current frequency of 20 Hz, pulse width 500 s, 1 x 30 seconds off x 3 minutes, duty cycle 16%.  In magnet mode the output current is 1.875 mA, pulse width 250 s on time 60 seconds,  An auto stimulation mode the output current was 1.75 mA, pulse width 250 s and on  time 60 seconds.  She has the tachycardia detection enabled on this device and the auto stimulation threshold is at 20% increase in heart rate.    07/06/22:   Traci [name] is a 48 y.o. 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 Returns today for follow-up.  She remains on Lamictal 150 mg twice a day as well as Topamax 50 mg twice a day.  She states that she continues to have 1-3 seizures typically around her menstrual cycle.  She reports that her seizure frequency has not increased.  At the last visit with Dr. Vickey Huger she mentioned having migraine headaches.  She states that she has approximately 1 headache a month.  Bernita Raisin has been helpful but is too expensive for her to purchase.  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  She has used in device requested she.avs Is a daily Magnet stimulation, average stimulations per  day is 1 Magnet stimulation 130 auto stimulations and the normal setting of the device itself allows for another 317 stimulations per day so the device is stimulated 448 times per day.  Output status is 1.625 mA, lead impedance is 2500 ohm and the generator battery is over 75% full.  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  She has reported some headaches not every day but these may also be related to nights when she does not get a whole lot of hours of sleep or when she could not fall asleep.  She reports that her nights are sometimes undesirable short and that she cannot go back to sleep after an arousal no matter what the cause of the arousal may be.  She estimates 4 to 6 hours average, she denies snoring or apneas being  witnessed by her husband.   04/14/20:   Traci [name] is a 48 year old female with a history of intractable seizures.  She returns today for follow-up.  She remains on Lamictal 150 mg twice a day and Topamax 50 mg twice a day.  At the last visit with Dr. Vickey Huger her VNS was adjusted.  She states that since that adjustment her seizure frequency has remained the same.  She has approximately 1-2 seizures a month.  Her seizures always occur around her menstrual cycle.  She continues to swipe her magnet during her menstrual cycle.  She joins me today through virtual visit           Review of Systems: Out of a complete 14 system review, the patient complains of only the following symptoms, and all other reviewed systems are negative.:   Social History   Socioeconomic History   Marital status: Married    Spouse name: [name]   Number of children: 2   Years of education: Some colle   Highest education level: Not on file  Occupational History   Not on file  Tobacco Use   Smoking status: Former    Current packs/day: 0.00    Types: Cigarettes    Quit date: 1999    Years since quitting: 26.1   Smokeless tobacco: Never  Vaping Use   Vaping status: Never Used  Substance and Sexual Activity   Alcohol use: Yes    Comment: occ   Drug use: No   Sexual activity: Yes    Birth control/protection: Surgical    Comment: Tubal Ligation  Other Topics Concern   Not on file  Social History Narrative   Lives at home with husband, [name]   Daily caffeine consumption- 1 cup   Pt doesn't work    Social Drivers of Corporate investment banker Strain: Not on Ship broker Insecurity: Not on file  Transportation Needs: Not on file  Physical Activity: Not on file  Stress: Not on file  Social Connections: Not on file    Family History  Problem Relation Age of Onset   Cancer Mother    Diabetes Father    Cancer Maternal Grandmother    Cancer Maternal Grandfather    Diabetes Paternal Grandmother     Diabetes Paternal Grandfather    Epilepsy Daughter    Migraines Neg Hx    Colon cancer Neg Hx    Rectal cancer Neg Hx    Stomach cancer Neg Hx    Esophageal cancer Neg Hx     Past Medical  History:  Diagnosis Date   COVID 2021   also had in 2020   Family history of adverse reaction to anesthesia    Father had nausea post anesthesia   GERD (gastroesophageal reflux disease)    Headache    History of hiatal hernia    History of kidney stones    passed stones-no surgery   IBS (irritable bowel syndrome)    Interstitial cystitis    Morbid (severe) obesity due to excess calories (HCC) 08/28/2020   bmi 41.14   Osgood-Schlatter's disease    "diagnosed years ago"   Pneumonia 2014   Seizures (HCC)    last one 02/2018 - on new medication    Past Surgical History:  Procedure Laterality Date   APPENDECTOMY     CESAREAN SECTION     x 2   CHOLECYSTECTOMY     COLONOSCOPY     normal-2010   DILATION AND CURETTAGE OF UTERUS  2008   KNEE SURGERY Right    x3   LAPAROSCOPY     endometriosis   lipotripsy     x 1 - kidney stone removal with stent   TUBAL LIGATION     UPPER GASTROINTESTINAL ENDOSCOPY  12/26/2014   Erosive esophagitis (LA grade B). Small hiatal hernoa. Mild gastritis   VAGUS NERVE STIMULATOR INSERTION N/A 07/04/2014   Procedure: VAGAL NERVE STIMULATOR IMPLANT;  Surgeon: Lisbeth Renshaw, MD;  Location: [facility];  Service: Neurosurgery;  Laterality: N/A;   VAGUS NERVE STIMULATOR INSERTION N/A 08/28/2020   Procedure: VAGUS NERVE STIMULATOR BATTERY REPLACEMENT;  Surgeon: Lisbeth Renshaw, MD;  Location: [facility] OR;  Service: Neurosurgery;  Laterality: N/A;     Current Outpatient Medications on File Prior to Visit  Medication Sig Dispense Refill   dicyclomine (BENTYL) 10 MG capsule Take 1 capsule (10 mg total) by mouth 2 (two) times daily. 60 capsule 3   lamoTRIgine (LAMICTAL) 150 MG tablet Take 1 tablet (150 mg total) by mouth 2 (two) times daily. 180 tablet 0    pantoprazole (PROTONIX) 40 MG tablet Take 1 tablet (40 mg total) by mouth 2 (two) times daily. TAKE 1 TABLET(40 MG) BY MOUTH TWICE DAILY BEFORE A MEAL. 180 tablet 4   promethazine (PHENERGAN) 25 MG tablet Take 1 tablet (25 mg total) by mouth every 8 (eight) hours as needed for nausea or vomiting. 30 tablet 5   topiramate (TOPAMAX) 100 MG tablet Take 0.5 tablets (50 mg total) by mouth in the morning and at bedtime. 180 tablet 3   Current Facility-Administered Medications on File Prior to Visit  Medication Dose Route Frequency Provider Last Rate Last Admin   0.9 %  sodium chloride infusion  500 mL Intravenous Once Lynann Bologna, MD       0.9 %  sodium chloride infusion  500 mL Intravenous Once Lynann Bologna, MD        No Known Allergies   DIAGNOSTIC DATA (LABS, IMAGING, TESTING) - I reviewed patient records, labs, notes, testing and imaging myself where available.  Lab Results  Component Value Date   WBC 7.9 11/10/2022   HGB 13.7 11/10/2022   HCT 42.1 11/10/2022   MCV 91.8 11/10/2022   PLT 346.0 11/10/2022      Component Value Date/Time   NA 140 11/10/2022 1123   NA 144 07/15/2020 0917   K 4.0 11/10/2022 1123   CL 107 11/10/2022 1123   CO2 23 11/10/2022 1123   GLUCOSE 93 11/10/2022 1123   BUN 18 11/10/2022 1123   BUN  16 07/15/2020 0917   CREATININE 0.85 11/10/2022 1123   CALCIUM 9.5 11/10/2022 1123   PROT 7.0 11/10/2022 1123   PROT 7.0 07/15/2020 0917   ALBUMIN 4.1 11/10/2022 1123   ALBUMIN 4.5 07/15/2020 0917   AST 12 11/10/2022 1123   ALT 15 11/10/2022 1123   ALKPHOS 63 11/10/2022 1123   BILITOT 0.4 11/10/2022 1123   BILITOT 0.3 07/15/2020 0917   GFRNONAA 82 07/09/2019 0901   GFRAA 95 07/09/2019 0901   No results found for: "CHOL", "HDL", "LDLCALC", "LDLDIRECT", "TRIG", "CHOLHDL" No results found for: "HGBA1C" No results found for: "VITAMINB12" Lab Results  Component Value Date   TSH 1.260 05/08/2014    PHYSICAL EXAM:  Today's Vitals   04/12/23 1341  BP: (!)  127/91  Pulse: 87  Weight: 244 lb (110.7 kg)  Height: 5\' 2"  (1.575 m)   Body mass index is 44.63 kg/m.   Wt Readings from Last 3 Encounters:  04/12/23 244 lb (110.7 kg)  11/10/22 236 lb (107 kg)  08/15/22 243 lb (110.2 kg)     Ht Readings from Last 3 Encounters:  04/12/23 5\' 2"  (1.575 m)  11/10/22 5\' 2"  (1.575 m)  08/15/22 5\' 2"  (1.575 m)      General: The patient is awake, alert and appears not in acute distress. The patient is well groomed. Head: Normocephalic, atraumatic. Neck is supple. Cardiovascular:  Regular rate and cardiac rhythm by pulse,  without distended neck veins. Respiratory: Lungs are clear to auscultation.  Skin:  Without evidence of ankle edema, or rash. Trunk: The patient's posture is erect.   NEUROLOGIC EXAM:Well developed, in no acute distress    Neurological examination  Mentation: Alert oriented to time, place, history taking. Follows all commands speech and language fluent Cranial nerve : no loss of taste or smell.  Pupils were equal round reactive to light. Extraocular movements were full,  visual field were full on confrontational test.  Head turning and shoulder shrug  were normal and symmetric. Motor: full strength of all 4 extremities and grip with symmetric motor tone is noted throughout.  Sensory: intact to vibration and soft touch No evidence of extinction is noted.  Coordination: reveals good-nose-finger bilaterally.  Gait and station: Gait is intact.  Reflexes: Deep tendon reflexes are symmetric and normal bilaterally.   ASSESSMENT AND PLAN 48 y.o. year- old female  here with:  year old female  has a past medical history of GERD (gastroesophageal reflux disease), Migraine, vascular Headache,  History of kidney stones, IBS (irritable bowel syndrome), Interstitial cystitis, Depression and JME (HCC). here with:    1) JME, reduced frequency of spells on VNS and medications, same for migraine headaches.   2)  contiue VNS use , yearly face  to face interrogation of the device to mainly assure battery power is not running low   3) medication refills. Seizure.  4) medication changing from Triptan to Nurtec or Ubrelvy due to dyseasthesias and bolus feeling in the throat.  This feeling as rose form imitrex, maxalt and frova. VNS has reduced migraine frequency more than all preventive meds.  Ubrelvy samples were given last time and poorly covered by her insurance, I will try Nurtec. I gave the patient 2 sample packs with 2 tabs each.    I plan to follow up either personally or through our NP within 12 months.   I would like to thank  Heywood Bene, Md [address] [place],  [place] [address] for allowing me to meet  with and to take care of this pleasant patient.     After spending a total time of  35  minutes face to face and additional time for physical and neurologic examination, review of laboratory studies,  personal review of imaging studies, reports and results of other testing and review of referral information / records as far as provided in visit,   Electronically signed by: Melvyn Novas, MD 04/12/2023 1:57 PM  [facility] Board certified by The [employer] of Sleep Medicine and Diplomate of the [employer] of [employer] Medicine. Board certified In Neurology through the ABPN, Fellow of the [employer] of Neu[employer].

## 2023-05-18 ENCOUNTER — Other Ambulatory Visit: Payer: Self-pay | Admitting: Neurology

## 2023-05-18 NOTE — Telephone Encounter (Signed)
 Last seen on 04/12/23 Follow up scheduled 04/11/24

## 2023-08-15 ENCOUNTER — Other Ambulatory Visit: Payer: Self-pay | Admitting: Gastroenterology

## 2023-09-01 DIAGNOSIS — Z87442 Personal history of urinary calculi: Secondary | ICD-10-CM | POA: Diagnosis not present

## 2023-09-01 DIAGNOSIS — R109 Unspecified abdominal pain: Secondary | ICD-10-CM | POA: Diagnosis not present

## 2023-09-01 DIAGNOSIS — N23 Unspecified renal colic: Secondary | ICD-10-CM | POA: Diagnosis not present

## 2024-02-13 ENCOUNTER — Other Ambulatory Visit: Payer: Self-pay | Admitting: Gastroenterology

## 2024-03-14 ENCOUNTER — Telehealth: Payer: Self-pay

## 2024-03-14 NOTE — Telephone Encounter (Signed)
 Received a paper medication refill request for Topiramate  100mg  tablets. I verified with the pharmacy that the patient has 1 refill left on file. Medication is getting ready for shipment and patient has been notified. Patient was informed to keep 04/11/24 appointment with Dr. Chalice for further refills.

## 2024-04-11 ENCOUNTER — Ambulatory Visit: Payer: Medicare Other | Admitting: Neurology
